# Patient Record
Sex: Female | Born: 1980 | Race: Black or African American | Hispanic: No | Marital: Single | Smoking: Never smoker
Health system: Southern US, Community
[De-identification: ages and names within clinical notes are randomized; demographics above are authoritative.]

## PROBLEM LIST (undated history)

## (undated) ENCOUNTER — Inpatient Hospital Stay (HOSPITAL_COMMUNITY): Payer: Medicaid Other

## (undated) DIAGNOSIS — E282 Polycystic ovarian syndrome: Secondary | ICD-10-CM

## (undated) DIAGNOSIS — R87629 Unspecified abnormal cytological findings in specimens from vagina: Secondary | ICD-10-CM

## (undated) DIAGNOSIS — I10 Essential (primary) hypertension: Secondary | ICD-10-CM

## (undated) DIAGNOSIS — R7303 Prediabetes: Secondary | ICD-10-CM

## (undated) DIAGNOSIS — B009 Herpesviral infection, unspecified: Secondary | ICD-10-CM

## (undated) DIAGNOSIS — N189 Chronic kidney disease, unspecified: Secondary | ICD-10-CM

## (undated) HISTORY — DX: Unspecified abnormal cytological findings in specimens from vagina: R87.629

## (undated) HISTORY — PX: MOUTH SURGERY: SHX715

## (undated) HISTORY — DX: Prediabetes: R73.03

## (undated) HISTORY — DX: Chronic kidney disease, unspecified: N18.9

## (undated) HISTORY — PX: KNEE SURGERY: SHX244

---
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
## 2004-06-16 IMAGING — CR DG CHEST 2V
2 series · 2 of 2 positions shown · non-contrast
Comparison: none
 Lung volumes are low.

CLINICAL DATA: Shortness of breath.  
 [GA] VIEWS:

[w chest pa *]
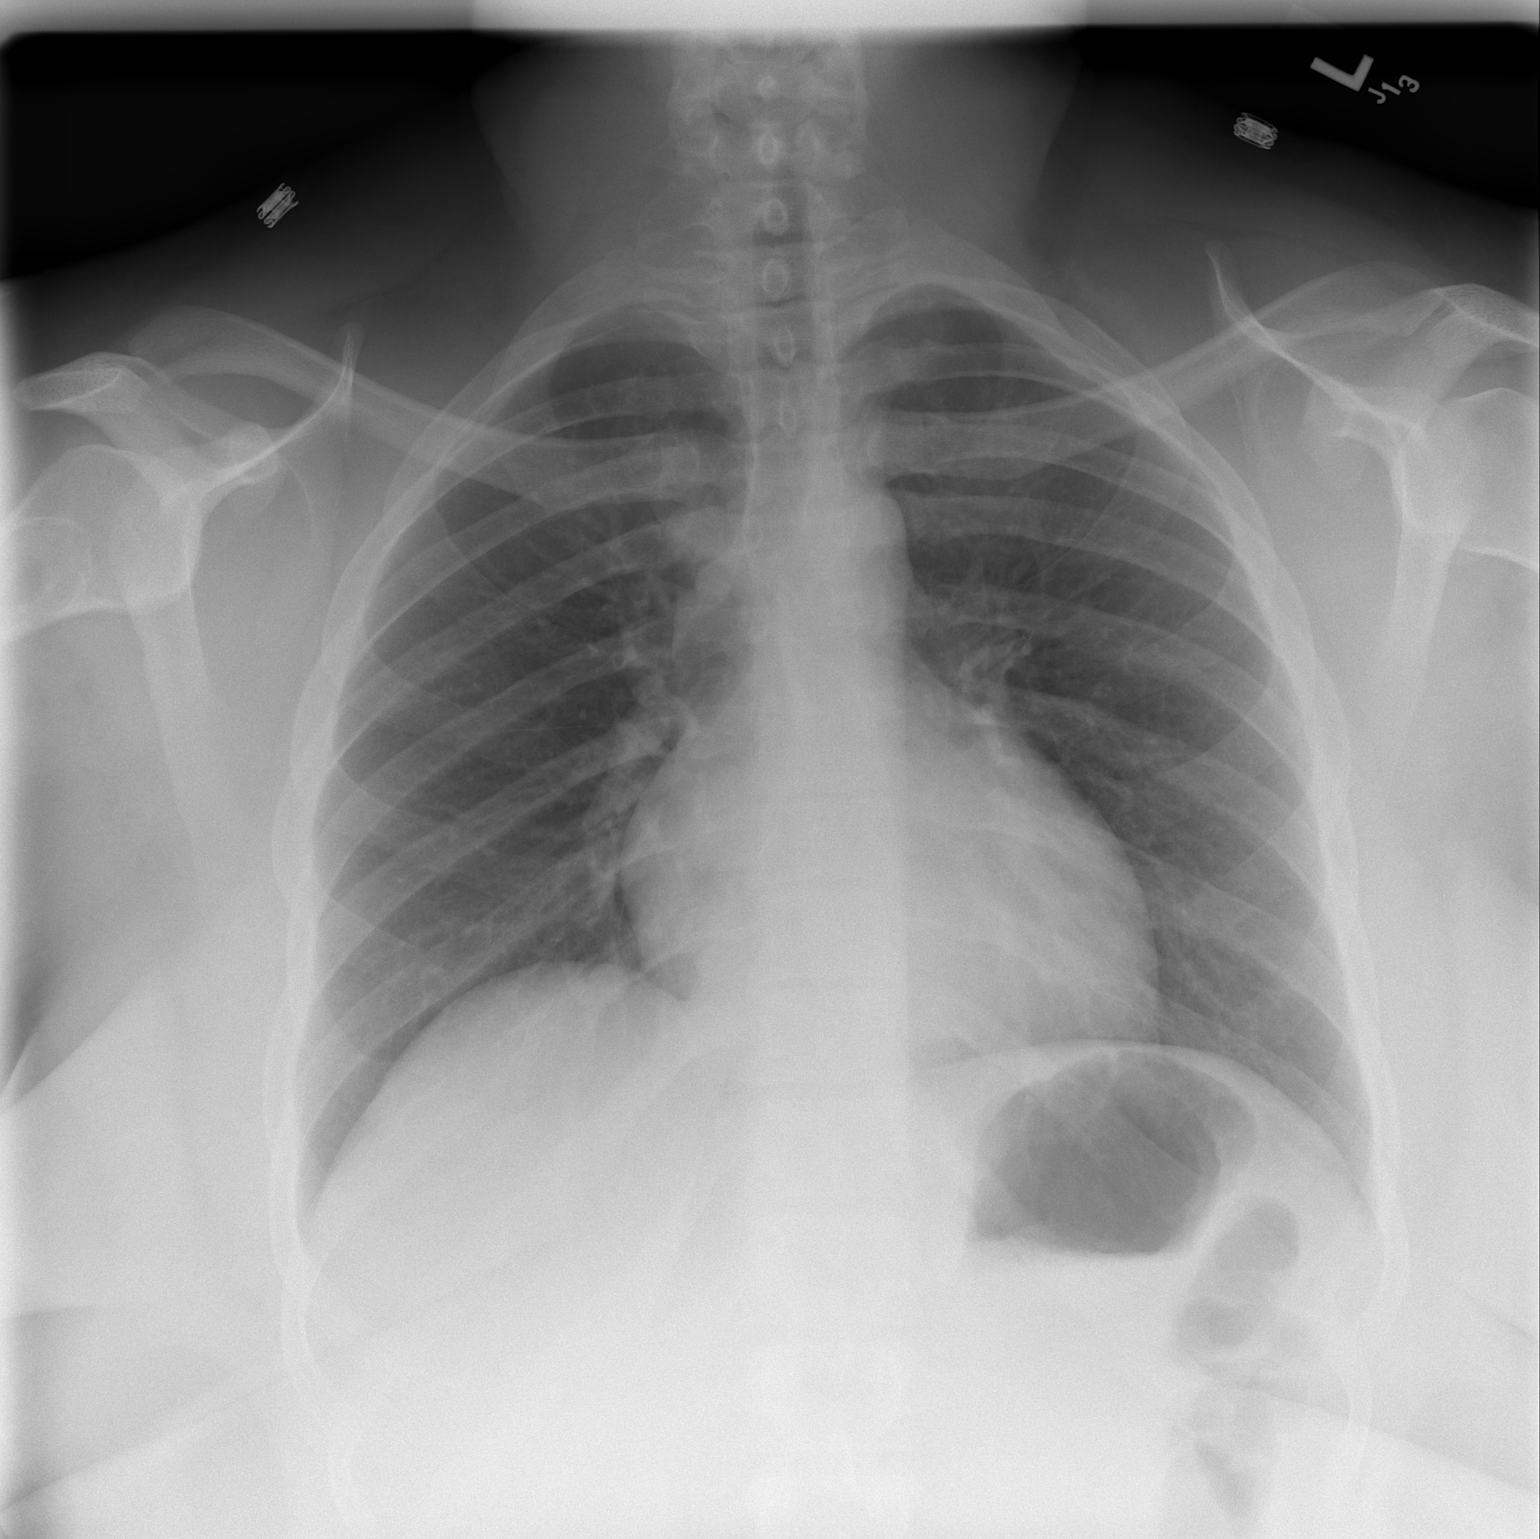

[w chest lat *]
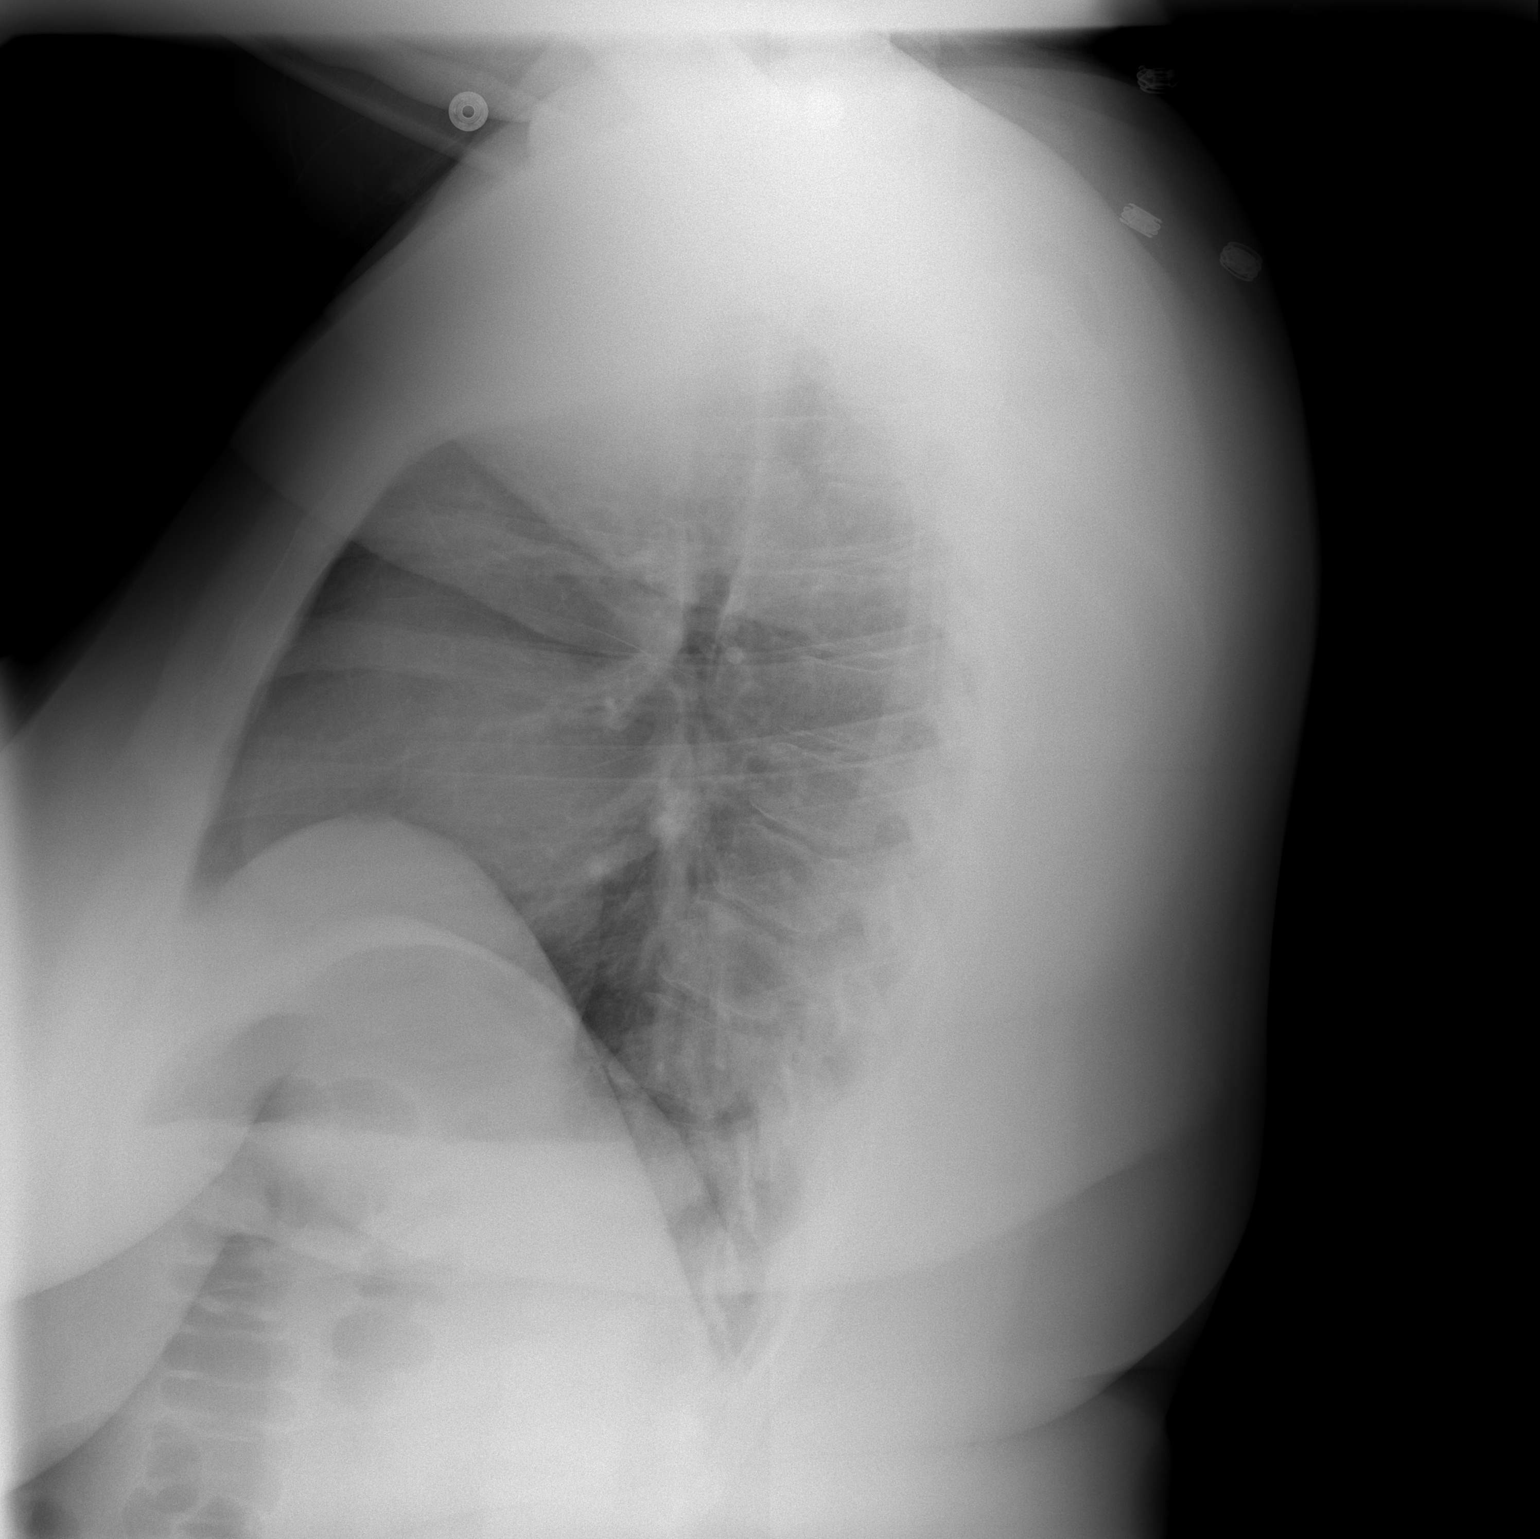

[2 of 2 positions shown; findings below may reference images not displayed]

There is no focal consolidation, edema or effusion.  The cardiopericardial silhouette is within normal limits for size.  Bony structures of the visualized thorax are intact.
IMPRESSION: No acute cardiopulmonary process.

## 2005-03-25 ENCOUNTER — Emergency Department (HOSPITAL_COMMUNITY): Admission: EM | Admit: 2005-03-25 | Discharge: 2005-03-26 | Payer: Self-pay | Admitting: Emergency Medicine

## 2005-06-16 ENCOUNTER — Emergency Department (HOSPITAL_COMMUNITY): Admission: EM | Admit: 2005-06-16 | Discharge: 2005-06-16 | Payer: Self-pay | Admitting: Emergency Medicine

## 2006-01-28 ENCOUNTER — Emergency Department (HOSPITAL_COMMUNITY): Admission: EM | Admit: 2006-01-28 | Discharge: 2006-01-29 | Payer: Self-pay | Admitting: Emergency Medicine

## 2006-05-15 ENCOUNTER — Encounter (INDEPENDENT_AMBULATORY_CARE_PROVIDER_SITE_OTHER): Payer: Self-pay | Admitting: Nurse Practitioner

## 2006-05-15 LAB — CONVERTED CEMR LAB
HCV Ab: NEGATIVE
Hep B S Ab: NEGATIVE
Hepatitis B Surface Ag: NEGATIVE
RPR Ser Ql: NONREACTIVE

## 2006-05-27 DIAGNOSIS — I10 Essential (primary) hypertension: Secondary | ICD-10-CM

## 2006-05-27 HISTORY — DX: Essential (primary) hypertension: I10

## 2006-06-09 ENCOUNTER — Encounter (INDEPENDENT_AMBULATORY_CARE_PROVIDER_SITE_OTHER): Payer: Self-pay | Admitting: Nurse Practitioner

## 2006-06-09 LAB — CONVERTED CEMR LAB: Hgb A1c MFr Bld: 6.8 %

## 2006-08-22 ENCOUNTER — Encounter (INDEPENDENT_AMBULATORY_CARE_PROVIDER_SITE_OTHER): Payer: Self-pay | Admitting: Nurse Practitioner

## 2006-08-22 LAB — CONVERTED CEMR LAB
Alkaline Phosphatase: 64 units/L
BUN: 28 mg/dL
CO2: 20 meq/L
Chloride: 103 meq/L
Cholesterol: 152 mg/dL
Creatinine, Ser: 1.4 mg/dL
HDL: 54 mg/dL
Hgb A1c MFr Bld: 5.5 %
LDL Cholesterol: 75 mg/dL
Total Protein: 8.4 g/dL
Triglycerides: 115 mg/dL

## 2007-08-19 ENCOUNTER — Ambulatory Visit: Payer: Self-pay | Admitting: Nurse Practitioner

## 2007-08-19 DIAGNOSIS — G44009 Cluster headache syndrome, unspecified, not intractable: Secondary | ICD-10-CM | POA: Insufficient documentation

## 2007-08-19 DIAGNOSIS — K029 Dental caries, unspecified: Secondary | ICD-10-CM | POA: Insufficient documentation

## 2007-09-08 ENCOUNTER — Encounter (INDEPENDENT_AMBULATORY_CARE_PROVIDER_SITE_OTHER): Payer: Self-pay | Admitting: Nurse Practitioner

## 2007-09-22 ENCOUNTER — Encounter (INDEPENDENT_AMBULATORY_CARE_PROVIDER_SITE_OTHER): Payer: Self-pay | Admitting: Nurse Practitioner

## 2007-10-02 ENCOUNTER — Ambulatory Visit: Payer: Self-pay | Admitting: Nurse Practitioner

## 2007-10-02 DIAGNOSIS — E669 Obesity, unspecified: Secondary | ICD-10-CM

## 2007-10-02 DIAGNOSIS — L259 Unspecified contact dermatitis, unspecified cause: Secondary | ICD-10-CM

## 2007-10-02 LAB — CONVERTED CEMR LAB
Alkaline Phosphatase: 43 units/L (ref 39–117)
BUN: 19 mg/dL (ref 6–23)
Basophils Absolute: 0 10*3/uL (ref 0.0–0.1)
Basophils Relative: 1 % (ref 0–1)
Bilirubin Urine: NEGATIVE
Blood in Urine, dipstick: NEGATIVE
CO2: 23 meq/L (ref 19–32)
Chlamydia, DNA Probe: NEGATIVE
Chloride: 108 meq/L (ref 96–112)
Creatinine, Ser: 1.04 mg/dL (ref 0.40–1.20)
Eosinophils Absolute: 0.1 10*3/uL (ref 0.0–0.7)
Eosinophils Relative: 2 % (ref 0–5)
FSH: 6.2 milliintl units/mL
Glucose, Urine, Semiquant: NEGATIVE
HDL: 46 mg/dL (ref >39)
LDL Cholesterol: 103 mg/dL — ABNORMAL HIGH (ref 0–99)
MCHC: 34.8 g/dL (ref 30.0–36.0)
Monocytes Relative: 6 % (ref 3–12)
Neutro Abs: 3.3 10*3/uL (ref 1.7–7.7)
Nitrite: NEGATIVE
Platelets: 246 10*3/uL (ref 150–400)
Potassium: 4.5 meq/L (ref 3.5–5.3)
Prolactin: 5 ng/mL
RBC: 5.08 M/uL (ref 3.87–5.11)
Sex Hormone Binding: 26 nmol/L (ref 18–114)
Sodium: 142 meq/L (ref 135–145)
Specific Gravity, Urine: >=1.03
TSH: 1.777 microintl units/mL (ref 0.350–5.50)
Testosterone Free: 10 pg/mL — ABNORMAL HIGH (ref 0.6–6.8)
Testosterone-% Free: 2.1 % (ref 0.4–2.4)
Testosterone: 48.55 ng/dL (ref 10–70)
Total CHOL/HDL Ratio: 3.5
Triglycerides: 48 mg/dL (ref <150)
VLDL: 10 mg/dL (ref 0–40)
WBC Urine, dipstick: NEGATIVE

## 2007-10-08 LAB — CONVERTED CEMR LAB: Pap Smear: NEGATIVE

## 2007-10-09 ENCOUNTER — Ambulatory Visit: Payer: Self-pay | Admitting: *Deleted

## 2007-10-09 ENCOUNTER — Ambulatory Visit (HOSPITAL_COMMUNITY): Admission: RE | Admit: 2007-10-09 | Discharge: 2007-10-09 | Payer: Self-pay | Admitting: Internal Medicine

## 2007-10-09 IMAGING — US US TRANSVAGINAL NON-OB
1 series · 14 of 25 positions shown · non-contrast
Comparison: None.

CLINICAL DATA: Lower pelvic discomfort.  Abnormal menses.  History
of polycystic ovary syndrome.

TRANSVAGINAL ULTRASOUND OF PELVIS,ULTRASOUND PELVIS COMPLETE -
MODIFY
TECHNIQUE: Transvaginal ultrasound examination of the pelvis was
performed including evaluation of the uterus, ovaries, adnexal
regions, and pelvic cul-de-sac,

[Series 1: unknown · 0.30mm/px · 14 of 65 slices shown]
[im 1/65]
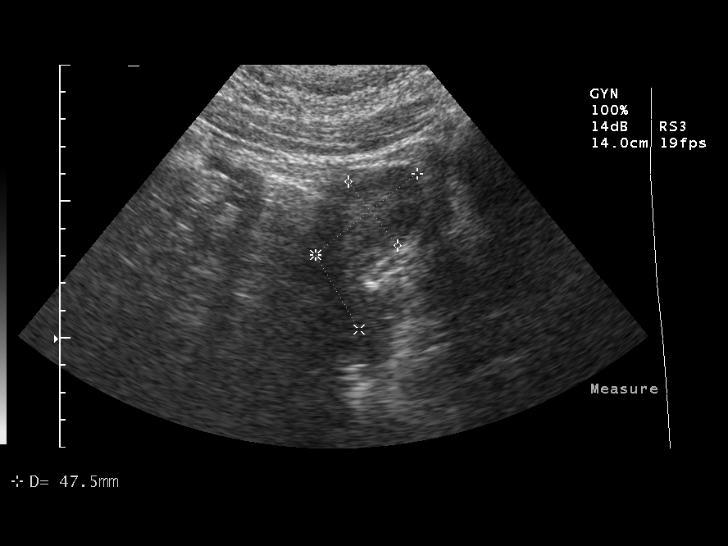
[im 6/65]
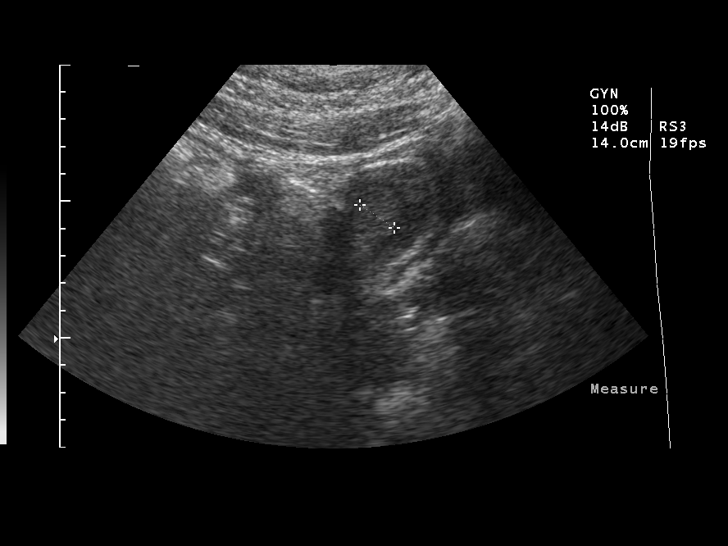
[im 11/65]
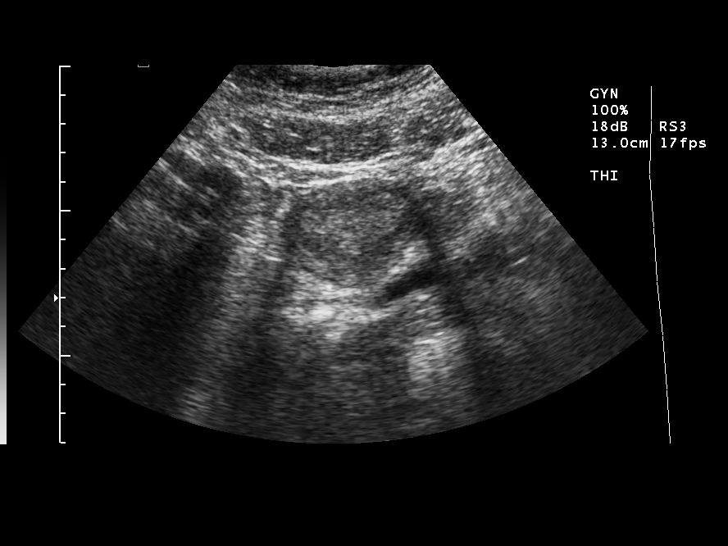
[im 17/65]
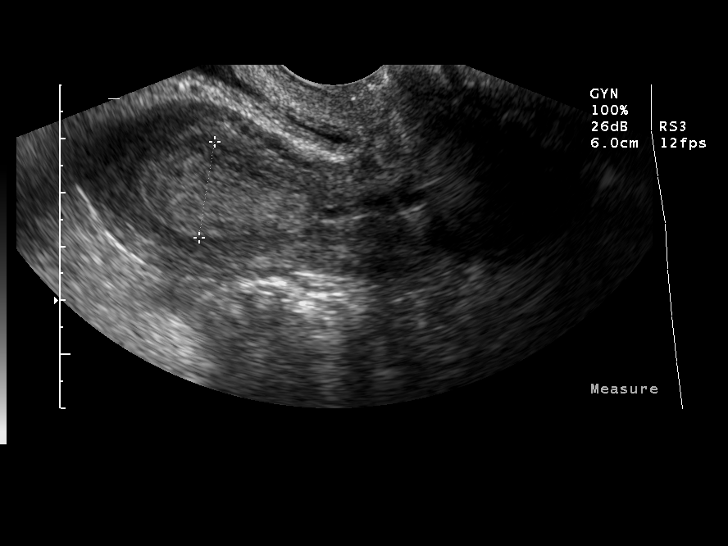
[im 22/65]
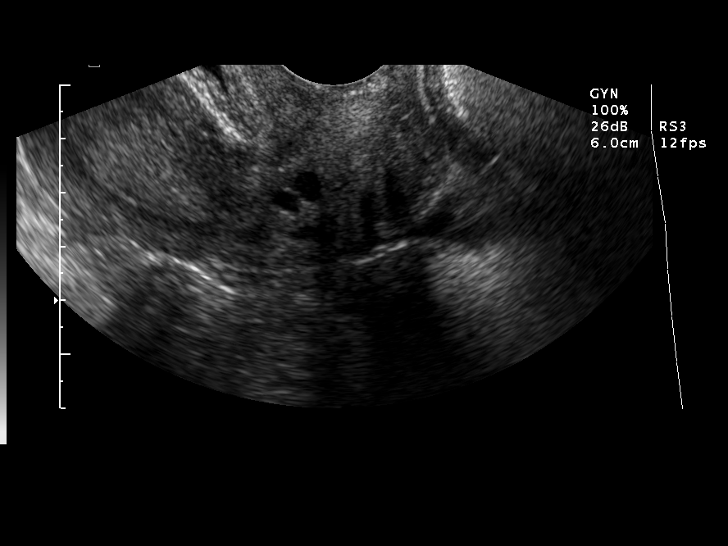
[im 25/65]
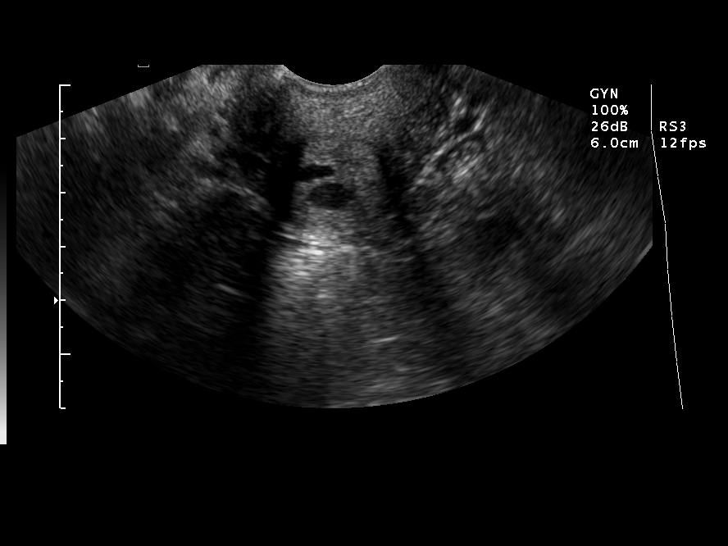
[im 30/65]
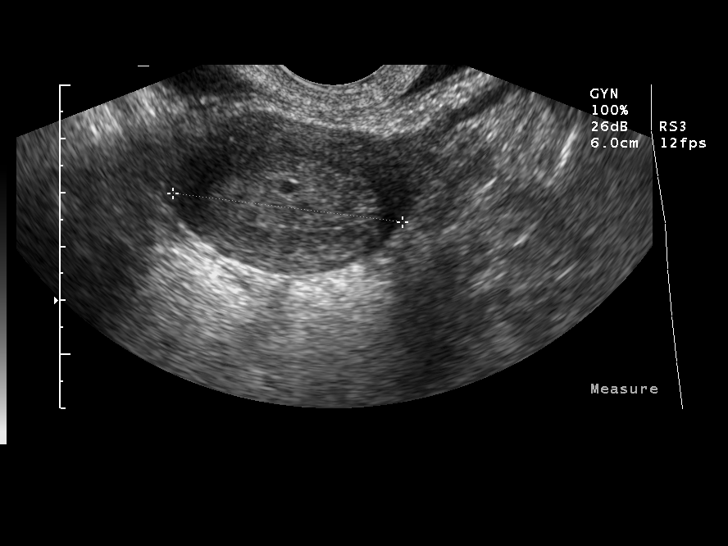
[im 35/65]
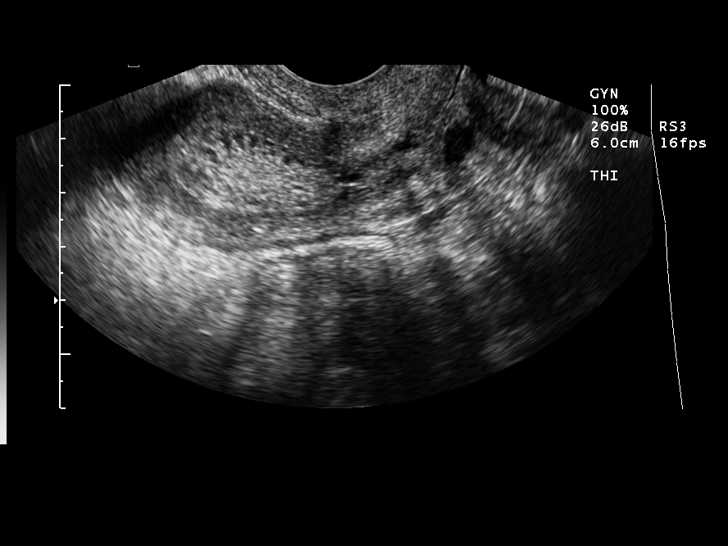
[im 41/65]
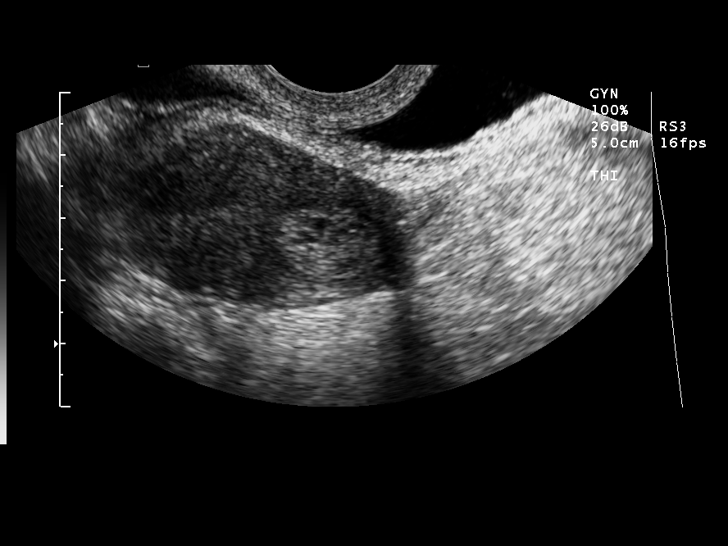
[im 43/65]
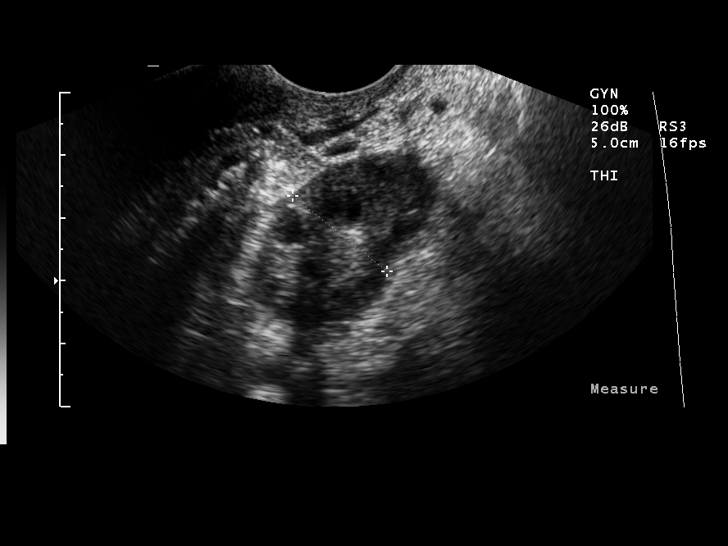
[im 49/65]
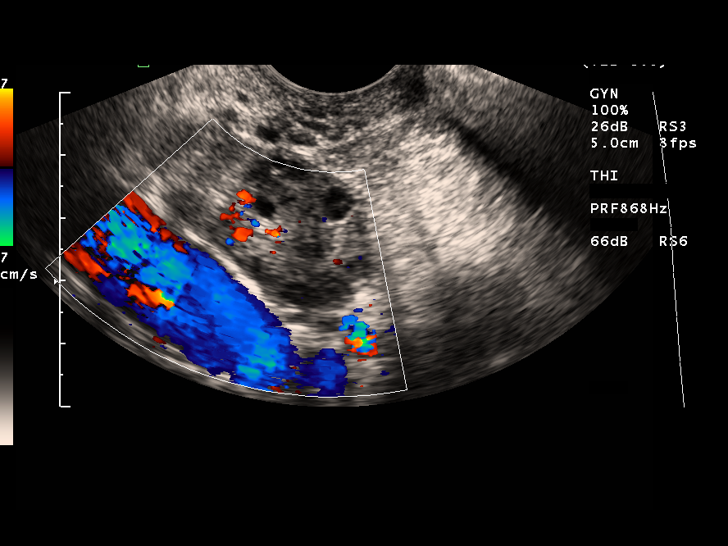
[im 54/65]
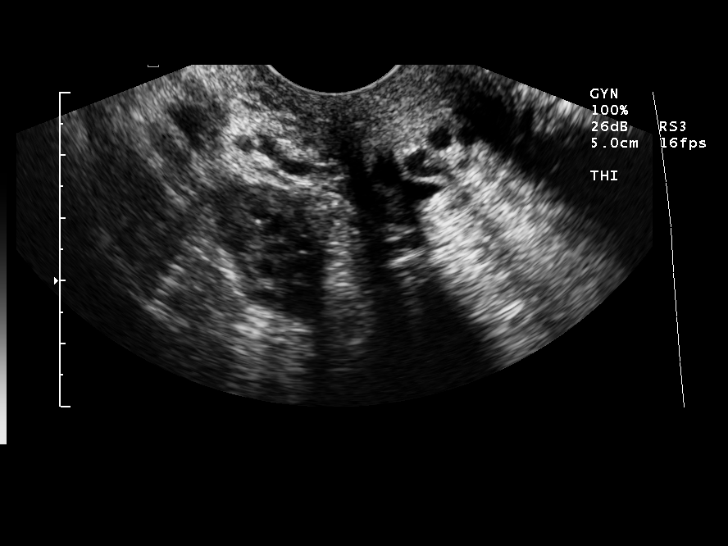
[im 59/65]
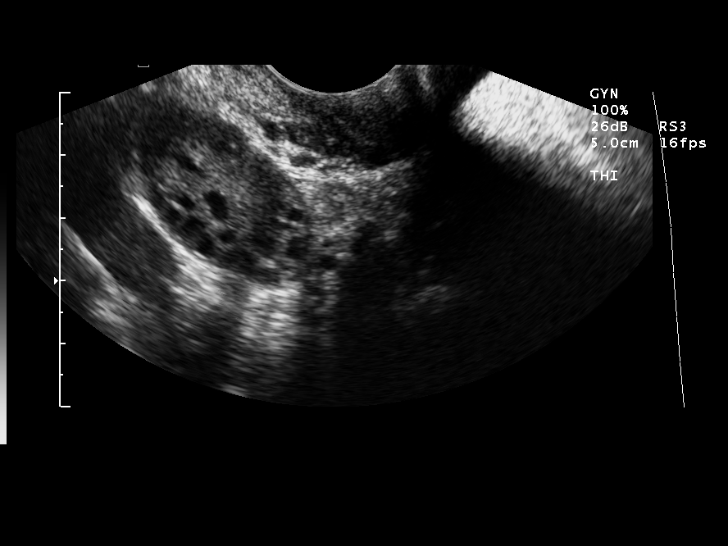
[im 65/65]
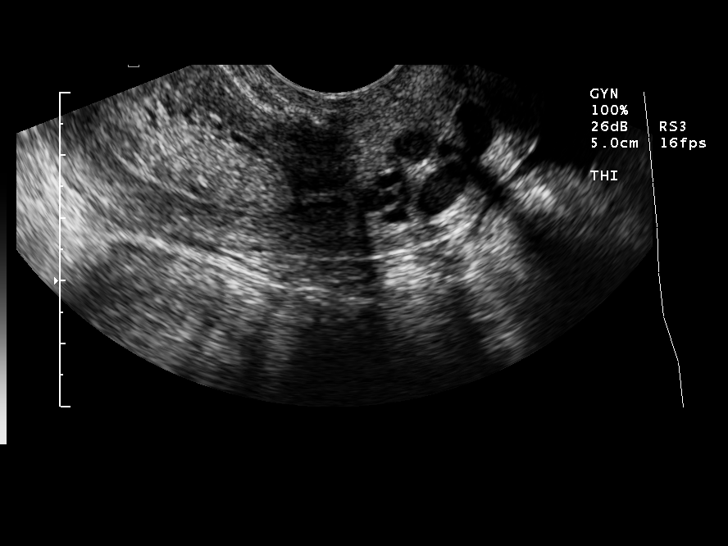

[14 of 25 positions shown; findings below may reference images not displayed]

FINDINGS: Uterus measures 8 x 2.9 x 4.3 cm.  Incidentally noted are
Nabothian cyst T.  Abnormally thickened endometrial lining
measuring up to 1.8 cm there may be endometrial polyps.  This will
require further delineation to exclude malignancy.

Right ovary measures 3.5 x 2 x 1.8 cm and the left ovary 3.5 x
x 1.9 cm.  The ovaries appear slightly rounded in configuration
with several follicles along the periphery which may be related to
the patient's polycystic ovary syndrome.  Small amount of free
fluid
IMPRESSION: Abnormal appearance of the endometrial lining.  Polyp or tumor
cannot be excluded.  This will require follow-up.

Increased number of ovarian follicles with a slightly rounded
contour of the ovaries.  This may be normal versus changes related
to polycystic ovary syndrome.

## 2007-10-21 ENCOUNTER — Encounter (INDEPENDENT_AMBULATORY_CARE_PROVIDER_SITE_OTHER): Payer: Self-pay | Admitting: *Deleted

## 2007-11-04 ENCOUNTER — Ambulatory Visit: Payer: Self-pay | Admitting: Nurse Practitioner

## 2007-12-29 ENCOUNTER — Telehealth (INDEPENDENT_AMBULATORY_CARE_PROVIDER_SITE_OTHER): Payer: Self-pay | Admitting: Nurse Practitioner

## 2008-01-14 ENCOUNTER — Ambulatory Visit (HOSPITAL_COMMUNITY): Admission: RE | Admit: 2008-01-14 | Discharge: 2008-01-14 | Payer: Self-pay | Admitting: Family Medicine

## 2008-01-14 IMAGING — US US TRANSVAGINAL NON-OB
1 series · 14 of 25 positions shown · non-contrast
Comparison: [DATE]

CLINICAL DATA: Endometrial thickening and polycystic ovarian
syndrome

TRANSABDOMINAL AND TRANSVAGINAL ULTRASOUND OF PELVIS
TECHNIQUE: Both transabdominal and transvaginal ultrasound
examinations of the pelvis were performed including evaluation of
the uterus, ovaries, adnexal regions, and pelvic cul-de-sac.

[Series 1: unknown · 0.26mm/px · 14 of 50 slices shown]
[im 1/50]
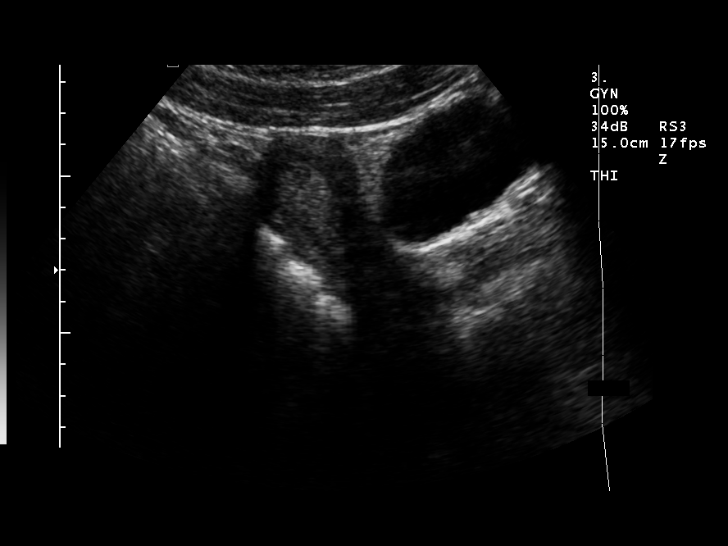
[im 5/50]
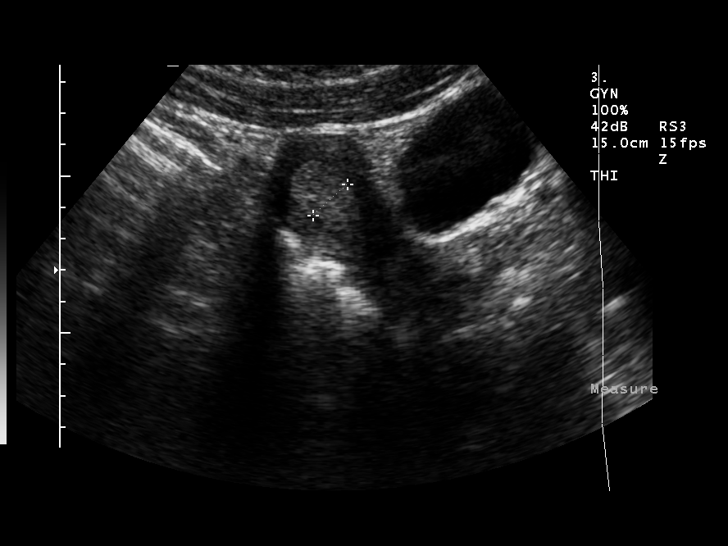
[im 9/50]
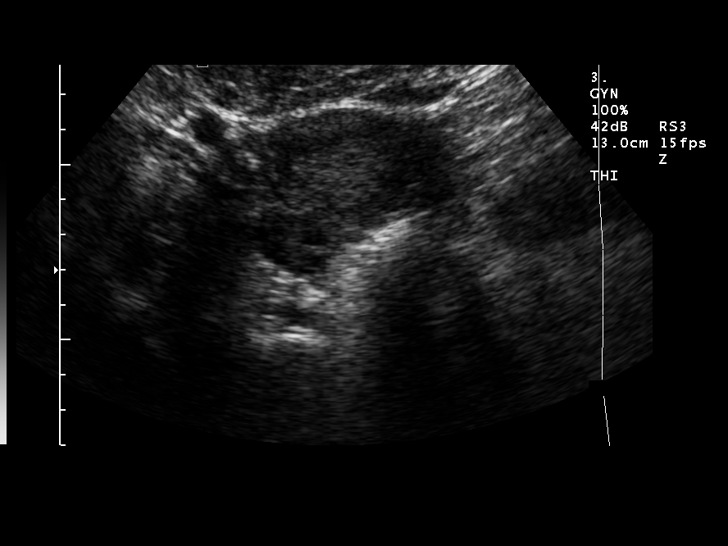
[im 13/50]
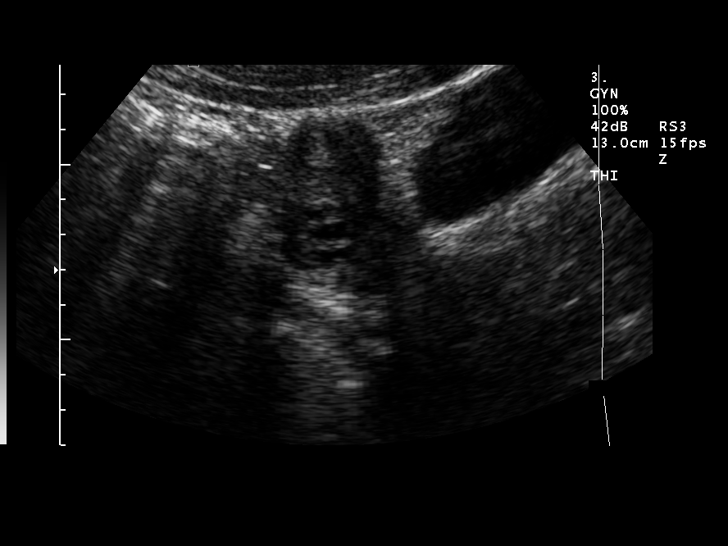
[im 17/50]
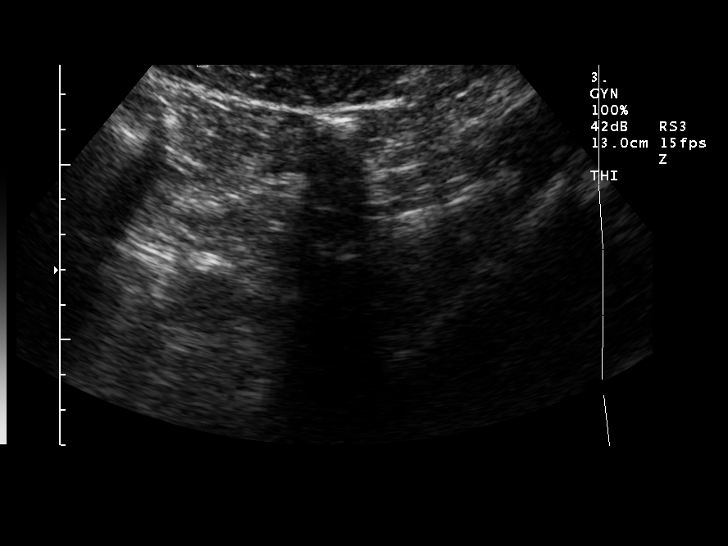
[im 19/50]
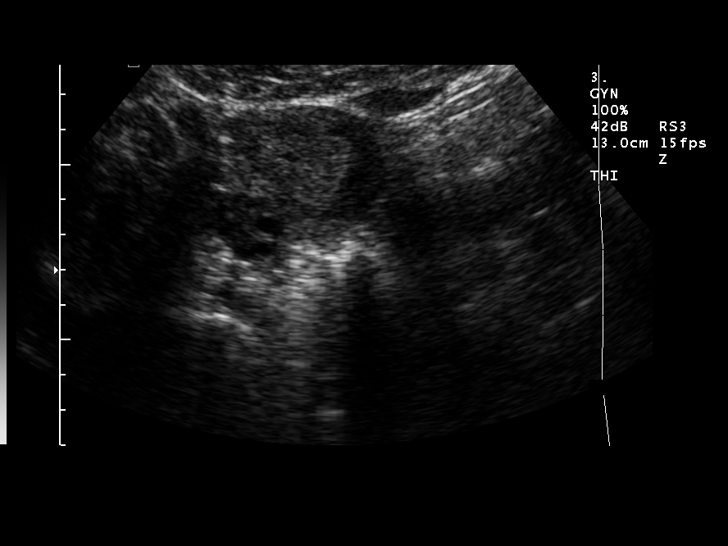
[im 23/50]
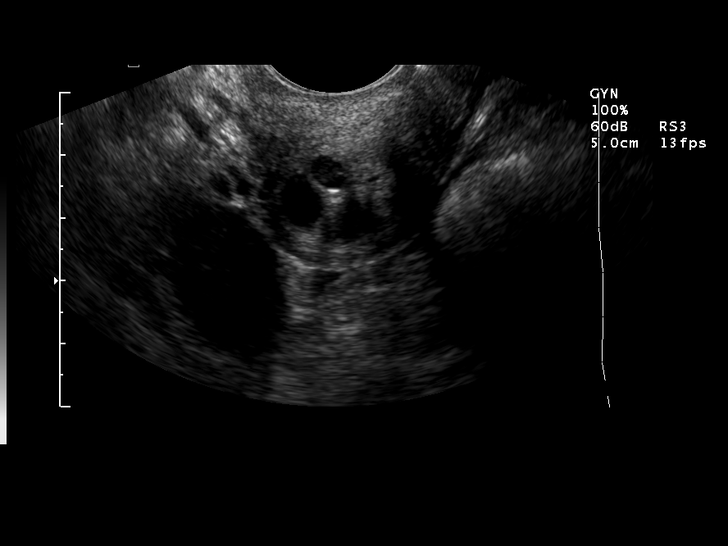
[im 27/50]
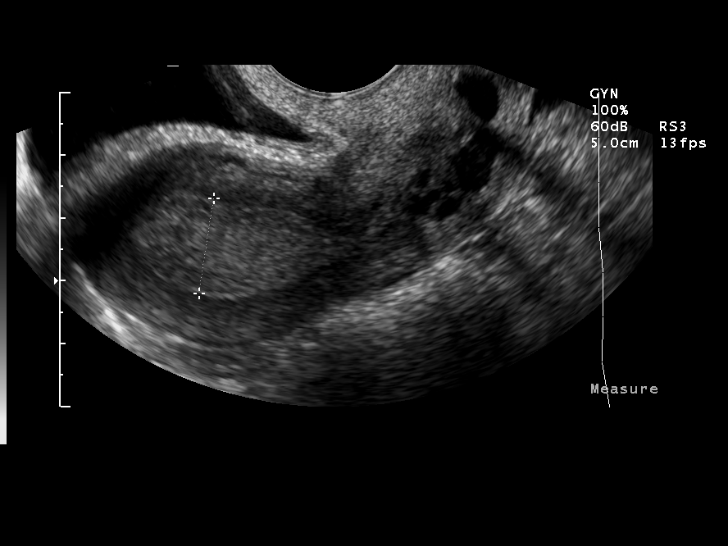
[im 31/50]
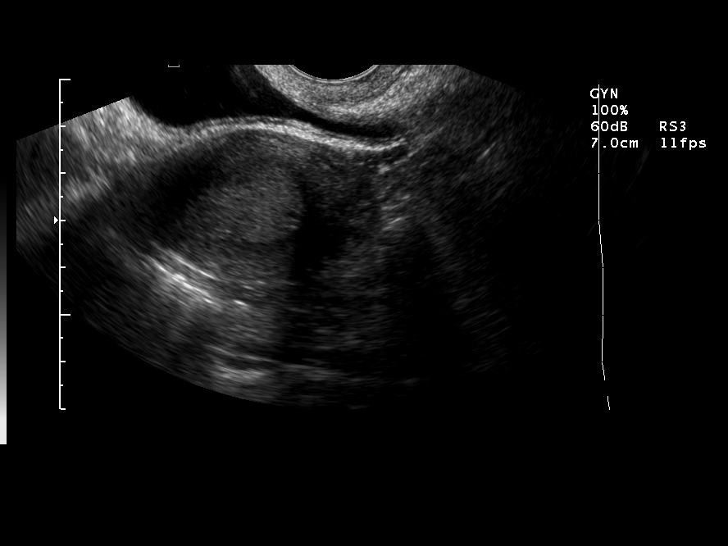
[im 33/50]
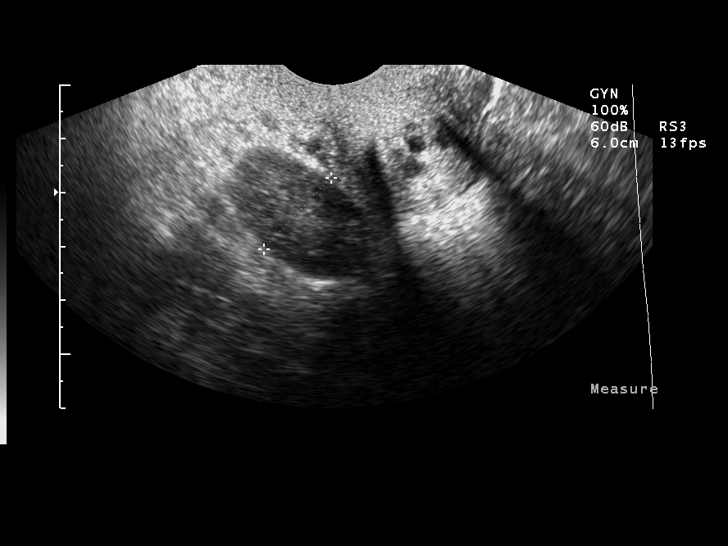
[im 37/50]
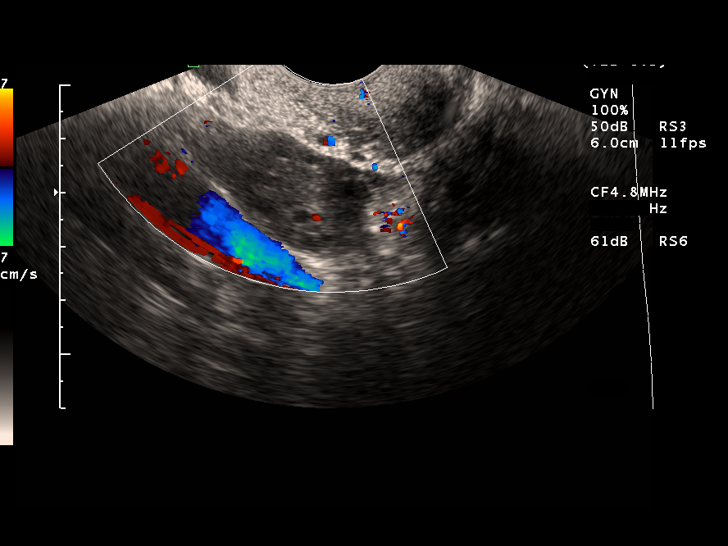
[im 41/50]
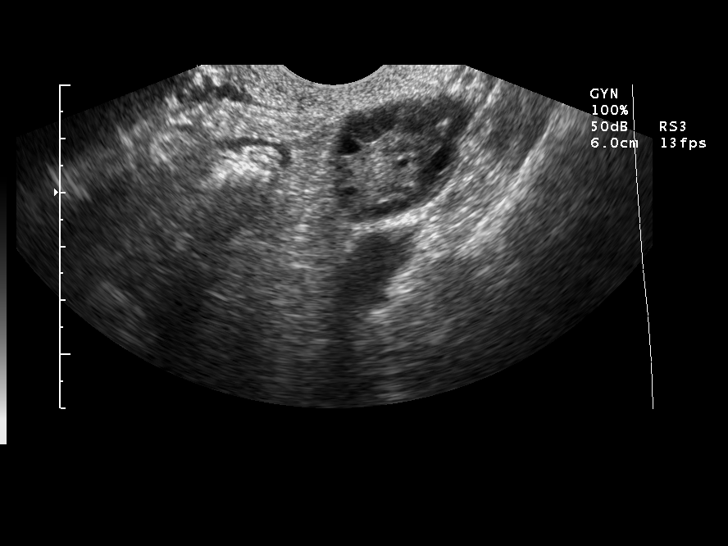
[im 45/50]
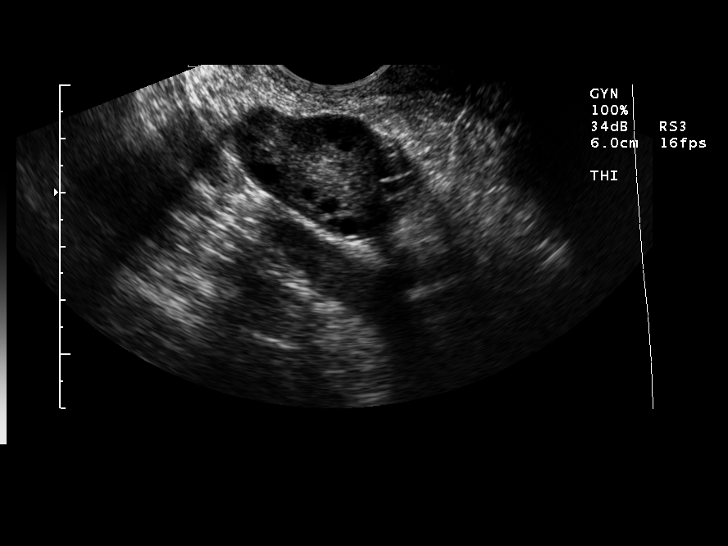
[im 50/50]
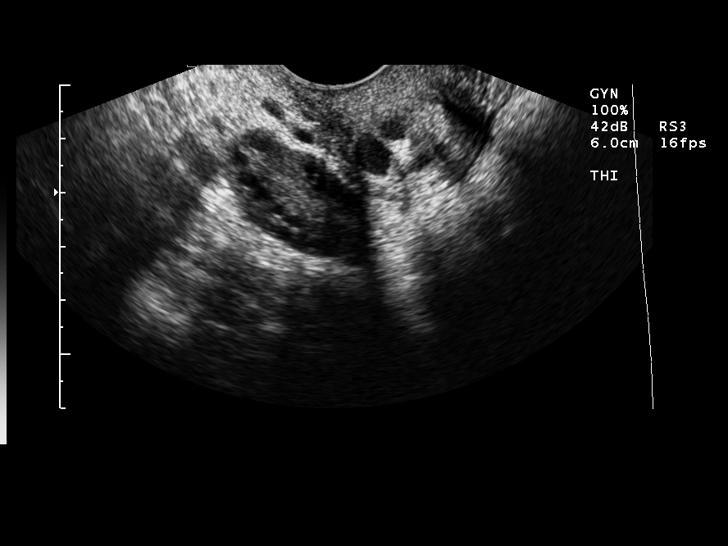

[14 of 25 positions shown; findings below may reference images not displayed]

FINDINGS: The uterus is normal in size measuring 7.9 x 3.3 x
cm.  The endometrium remains thick and heterogeneous, measuring 16
mm in width.

Both ovaries have a normal size and appearance.  The right ovary
measures 1.8 x 3.6 x 2.0 cm, and the left ovary measures 2.1 x
x 2.0 cm.  No adnexal masses or free pelvic fluid are identified.
IMPRESSION: Persistent thickening and heterogeneity of the endometrium since
[DATE].  Tissue sampling is recommended.

## 2008-01-18 ENCOUNTER — Encounter (INDEPENDENT_AMBULATORY_CARE_PROVIDER_SITE_OTHER): Payer: Self-pay | Admitting: Nurse Practitioner

## 2008-01-18 ENCOUNTER — Encounter (INDEPENDENT_AMBULATORY_CARE_PROVIDER_SITE_OTHER): Payer: Self-pay | Admitting: *Deleted

## 2008-01-18 DIAGNOSIS — N8500 Endometrial hyperplasia, unspecified: Secondary | ICD-10-CM

## 2008-01-22 ENCOUNTER — Ambulatory Visit: Payer: Self-pay | Admitting: Nurse Practitioner

## 2008-01-27 ENCOUNTER — Encounter (INDEPENDENT_AMBULATORY_CARE_PROVIDER_SITE_OTHER): Payer: Self-pay | Admitting: Nurse Practitioner

## 2008-03-09 ENCOUNTER — Other Ambulatory Visit: Admission: RE | Admit: 2008-03-09 | Discharge: 2008-03-09 | Payer: Self-pay | Admitting: Obstetrics and Gynecology

## 2008-03-09 ENCOUNTER — Ambulatory Visit: Payer: Self-pay | Admitting: Obstetrics and Gynecology

## 2008-03-09 ENCOUNTER — Encounter: Payer: Self-pay | Admitting: Obstetrics and Gynecology

## 2008-03-23 ENCOUNTER — Ambulatory Visit: Payer: Self-pay | Admitting: Obstetrics and Gynecology

## 2008-06-21 ENCOUNTER — Ambulatory Visit: Payer: Self-pay | Admitting: Nurse Practitioner

## 2008-06-21 DIAGNOSIS — R252 Cramp and spasm: Secondary | ICD-10-CM

## 2008-06-21 DIAGNOSIS — I10 Essential (primary) hypertension: Secondary | ICD-10-CM

## 2008-06-21 LAB — CONVERTED CEMR LAB
Calcium: 9.5 mg/dL (ref 8.4–10.5)
Glucose, Bld: 92 mg/dL (ref 70–99)

## 2008-06-22 ENCOUNTER — Encounter (INDEPENDENT_AMBULATORY_CARE_PROVIDER_SITE_OTHER): Payer: Self-pay | Admitting: Nurse Practitioner

## 2008-07-04 ENCOUNTER — Emergency Department (HOSPITAL_COMMUNITY): Admission: EM | Admit: 2008-07-04 | Discharge: 2008-07-04 | Payer: Self-pay | Admitting: Family Medicine

## 2008-07-04 IMAGING — CR DG CHEST 2V
2 series · 2 of 2 positions shown · non-contrast
Comparison: [DATE]

CLINICAL DATA: Chest pain and shortness of breath.

CHEST - 2 VIEW

[view not recorded (1 of 2)]
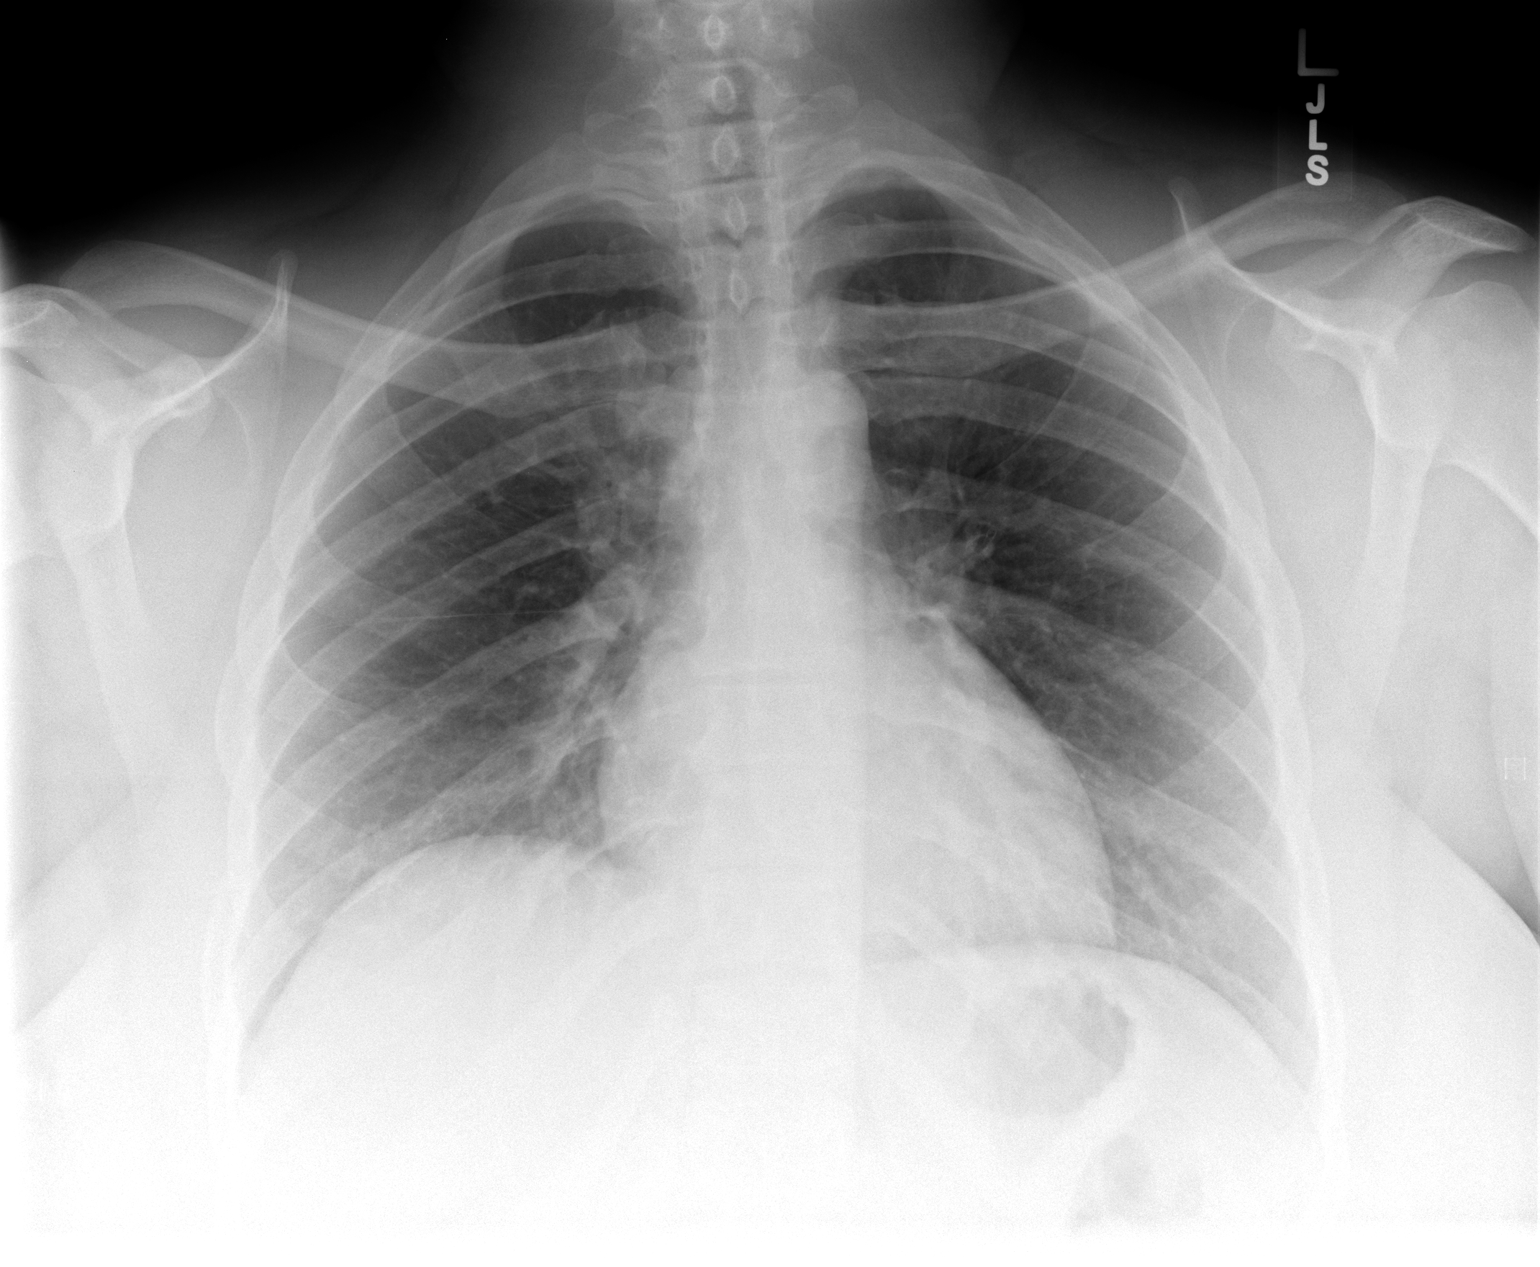

[view not recorded (2 of 2)]
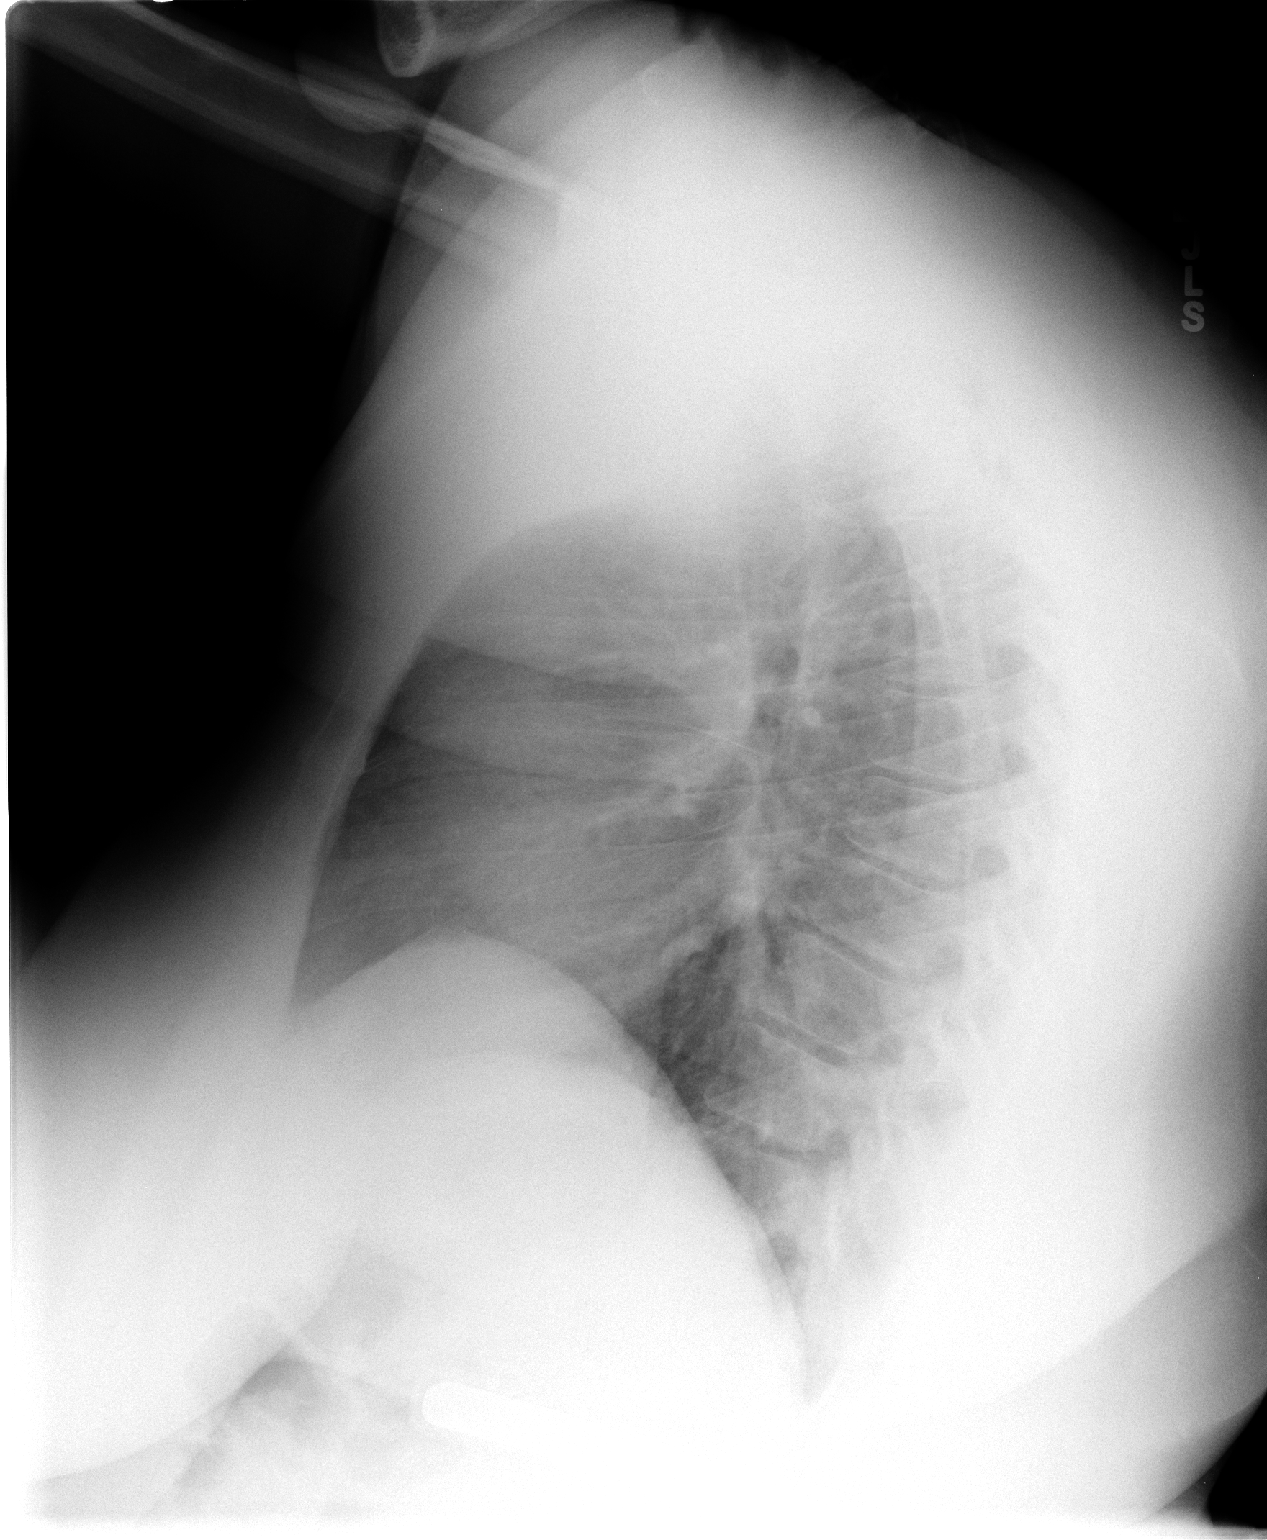

[2 of 2 positions shown; findings below may reference images not displayed]

FINDINGS: Trachea is midline.  Heart size stable.  Minimal right
basilar atelectasis.  Lungs otherwise clear.  No pleural fluid.
IMPRESSION: No acute findings.

REF:W2 DICTATED: [DATE] [DATE]

## 2008-10-21 ENCOUNTER — Encounter (INDEPENDENT_AMBULATORY_CARE_PROVIDER_SITE_OTHER): Payer: Self-pay | Admitting: Nurse Practitioner

## 2008-10-21 ENCOUNTER — Ambulatory Visit: Payer: Self-pay | Admitting: Nurse Practitioner

## 2008-10-21 LAB — CONVERTED CEMR LAB
Blood in Urine, dipstick: NEGATIVE
Glucose, Urine, Semiquant: NEGATIVE
KOH Prep: NEGATIVE
Ketones, urine, test strip: NEGATIVE
Nitrite: NEGATIVE
pH: 5.5

## 2008-10-27 ENCOUNTER — Encounter (INDEPENDENT_AMBULATORY_CARE_PROVIDER_SITE_OTHER): Payer: Self-pay | Admitting: Nurse Practitioner

## 2008-10-27 DIAGNOSIS — N760 Acute vaginitis: Secondary | ICD-10-CM | POA: Insufficient documentation

## 2008-10-27 DIAGNOSIS — D649 Anemia, unspecified: Secondary | ICD-10-CM

## 2008-10-27 LAB — CONVERTED CEMR LAB
AST: 18 units/L (ref 0–37)
Albumin: 3.8 g/dL (ref 3.5–5.2)
Alkaline Phosphatase: 38 units/L — ABNORMAL LOW (ref 39–117)
BUN: 10 mg/dL (ref 6–23)
Basophils Relative: 1 % (ref 0–1)
Calcium: 9.3 mg/dL (ref 8.4–10.5)
Chlamydia, DNA Probe: NEGATIVE
Cholesterol: 182 mg/dL (ref 0–200)
GC Probe Amp, Genital: NEGATIVE
Glucose, Bld: 79 mg/dL (ref 70–99)
HCT: 35 % — ABNORMAL LOW (ref 36.0–46.0)
LDL Cholesterol: 115 mg/dL — ABNORMAL HIGH (ref 0–99)
Lymphocytes Relative: 37 % (ref 12–46)
Lymphs Abs: 2 10*3/uL (ref 0.7–4.0)
MCV: 83.3 fL (ref 78.0–100.0)
Monocytes Relative: 8 % (ref 3–12)
Neutro Abs: 2.8 10*3/uL (ref 1.7–7.7)
Platelets: 279 10*3/uL (ref 150–400)
Potassium: 4.5 meq/L (ref 3.5–5.3)
RDW: 14.7 % (ref 11.5–15.5)
Sodium: 143 meq/L (ref 135–145)
TSH: 1.435 microintl units/mL (ref 0.350–4.500)
Total Bilirubin: 0.3 mg/dL (ref 0.3–1.2)
Triglycerides: 80 mg/dL (ref <150)

## 2009-01-24 ENCOUNTER — Ambulatory Visit: Payer: Self-pay | Admitting: Nurse Practitioner

## 2009-01-24 LAB — CONVERTED CEMR LAB: GC Probe Amp, Urine: NEGATIVE

## 2009-01-25 ENCOUNTER — Encounter (INDEPENDENT_AMBULATORY_CARE_PROVIDER_SITE_OTHER): Payer: Self-pay | Admitting: Nurse Practitioner

## 2009-06-25 ENCOUNTER — Emergency Department (HOSPITAL_COMMUNITY): Admission: EM | Admit: 2009-06-25 | Discharge: 2009-06-25 | Payer: Self-pay | Admitting: Emergency Medicine

## 2009-08-07 ENCOUNTER — Ambulatory Visit: Payer: Self-pay | Admitting: Nurse Practitioner

## 2009-08-07 LAB — CONVERTED CEMR LAB
Casts: NONE SEEN /lpf
Crystals: NONE SEEN
Leukocytes, UA: NEGATIVE
Specific Gravity, Urine: 1.023 (ref 1.005–1.030)

## 2009-08-15 ENCOUNTER — Emergency Department (HOSPITAL_COMMUNITY): Admission: EM | Admit: 2009-08-15 | Discharge: 2009-08-15 | Payer: Self-pay | Admitting: Family Medicine

## 2009-08-15 IMAGING — CR DG CHEST 2V
2 series · 2 of 2 positions shown · non-contrast
Comparison: [HOSPITAL] chest x-ray [DATE].

CLINICAL DATA: Blunt trauma with chest pain.

CHEST - 2 VIEW

[view not recorded (1 of 2)]
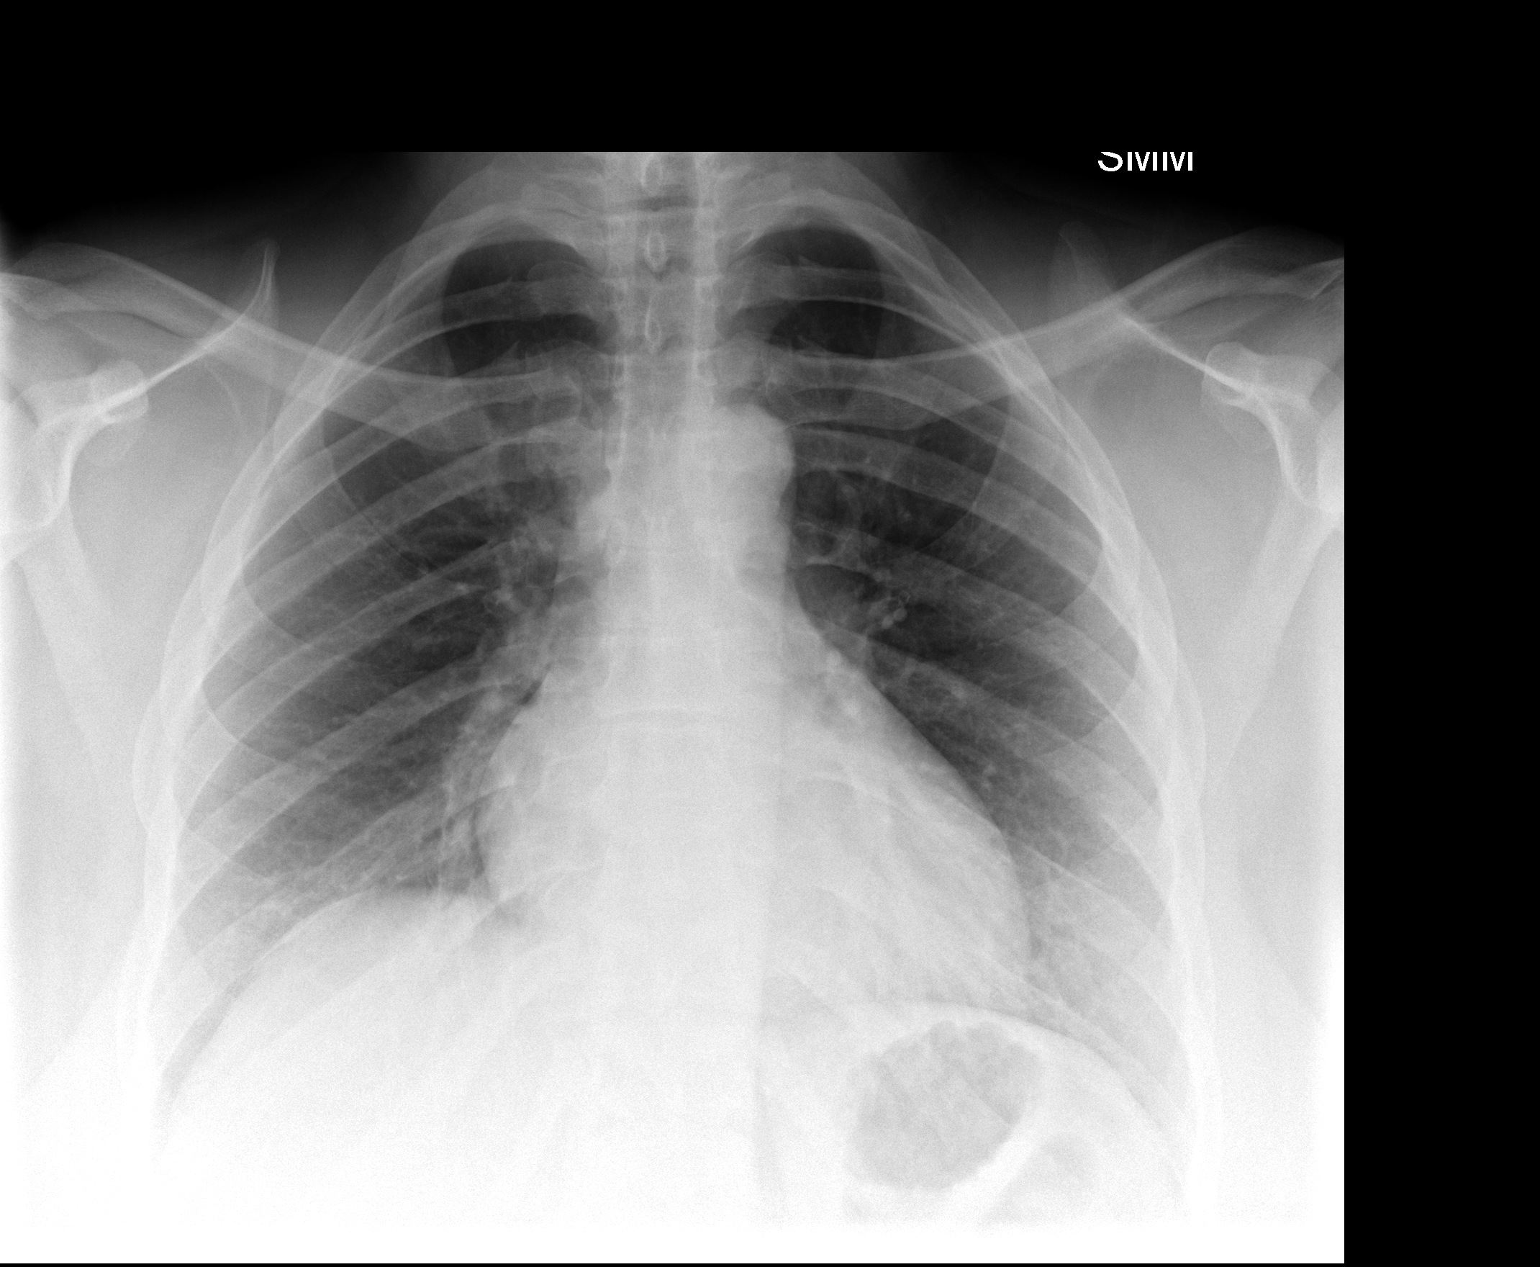

[view not recorded (2 of 2)]
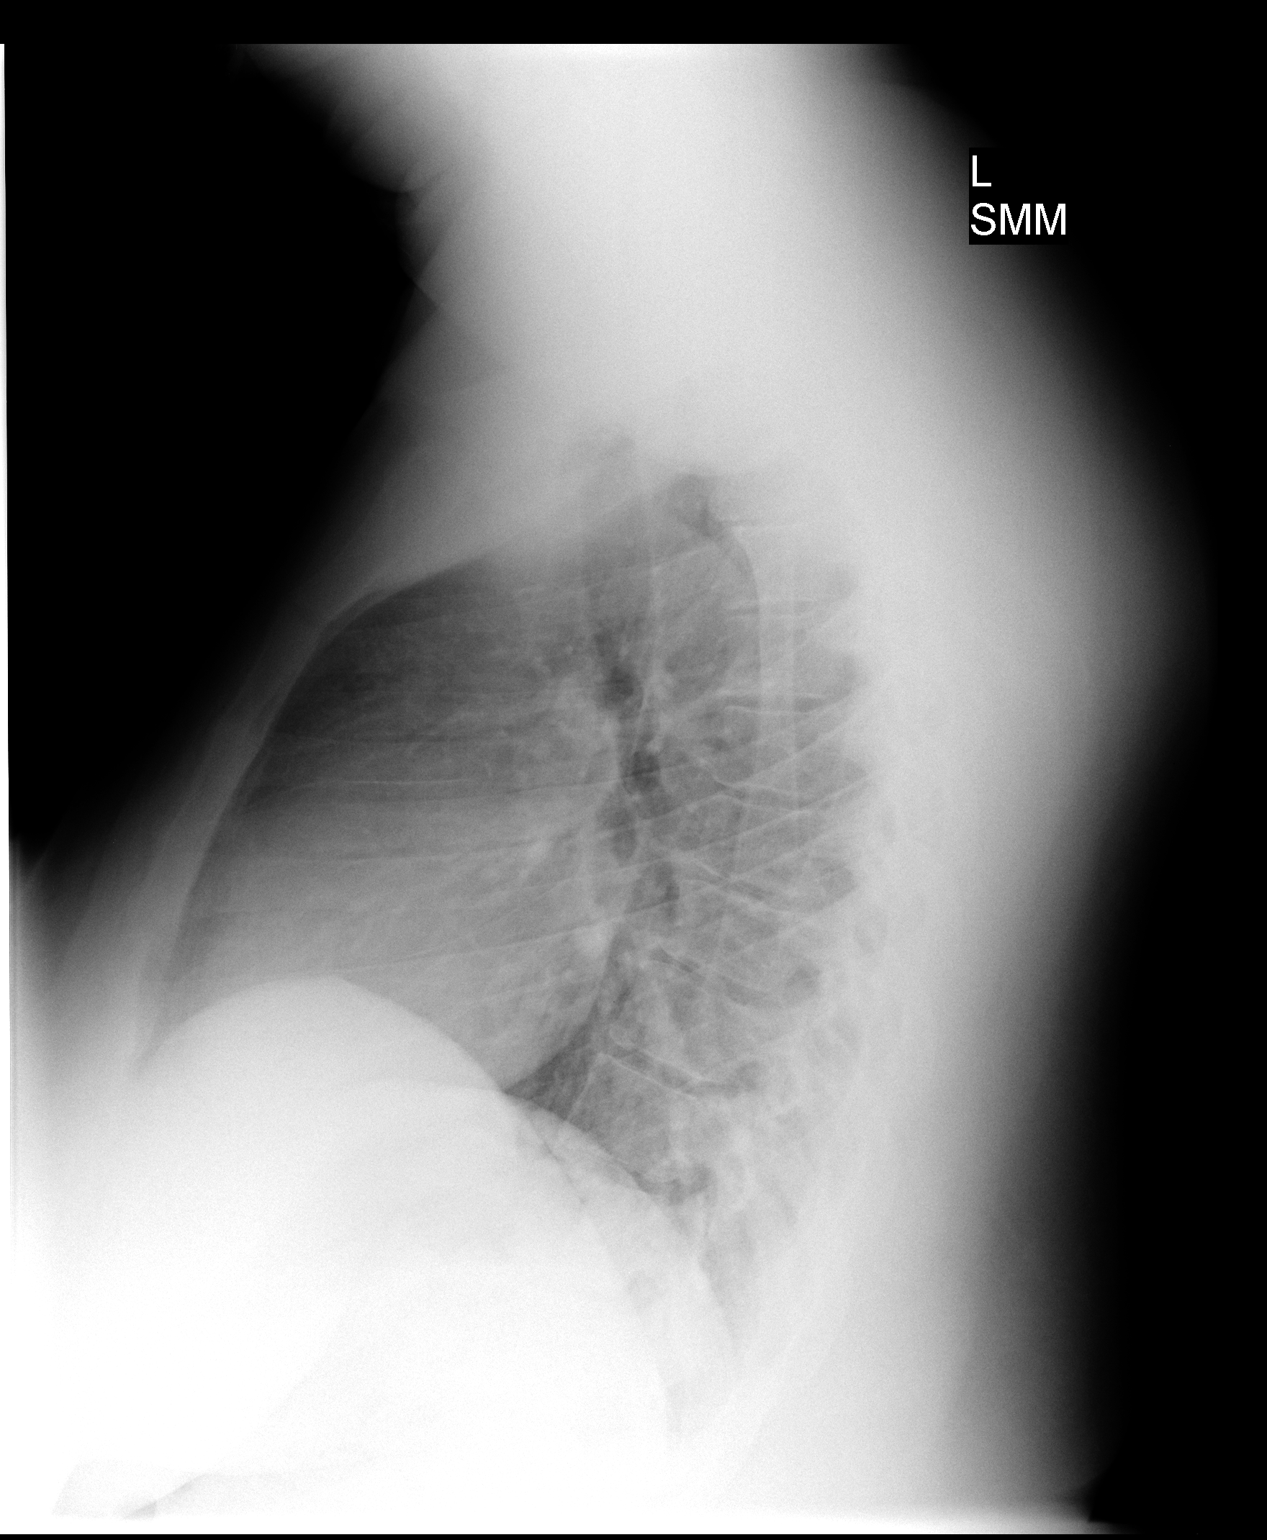

[2 of 2 positions shown; findings below may reference images not displayed]

FINDINGS: Lungs are clear.  Heart size is normal.  Mediastinum,
hila, pleura and osseous structures are stable and unremarkable.
Specifically no displaced rib fracture or
pneumothorax/pneumomediastinum or pleural fluid seen.
IMPRESSION: Stable - normal.

## 2009-08-21 ENCOUNTER — Ambulatory Visit: Payer: Self-pay | Admitting: Nurse Practitioner

## 2009-10-18 ENCOUNTER — Ambulatory Visit: Payer: Self-pay | Admitting: Nurse Practitioner

## 2009-10-18 LAB — CONVERTED CEMR LAB
AST: 14 units/L (ref 0–37)
Albumin: 3.7 g/dL (ref 3.5–5.2)
BUN: 10 mg/dL (ref 6–23)
Blood Glucose, Fingerstick: 77
CO2: 24 meq/L (ref 19–32)
Calcium: 8.7 mg/dL (ref 8.4–10.5)
Cholesterol: 168 mg/dL (ref 0–200)
Creatinine, Ser: 0.93 mg/dL (ref 0.40–1.20)
Glucose, Bld: 77 mg/dL (ref 70–99)
HCT: 35.7 % — ABNORMAL LOW (ref 36.0–46.0)
Hemoglobin: 11.9 g/dL — ABNORMAL LOW (ref 12.0–15.0)
LDL Cholesterol: 104 mg/dL — ABNORMAL HIGH (ref 0–99)
MCHC: 33.3 g/dL (ref 30.0–36.0)
MCV: 86.4 fL (ref 78.0–100.0)
Monocytes Absolute: 0.4 10*3/uL (ref 0.1–1.0)
Neutrophils Relative %: 53 % (ref 43–77)
Platelets: 208 10*3/uL (ref 150–400)
RBC: 4.13 M/uL (ref 3.87–5.11)
RDW: 14.6 % (ref 11.5–15.5)
Rapid HIV Screen: NEGATIVE
TSH: 1.267 microintl units/mL (ref 0.350–4.500)
Total Bilirubin: 0.3 mg/dL (ref 0.3–1.2)
Total CHOL/HDL Ratio: 3.1
Triglycerides: 49 mg/dL (ref <150)
VLDL: 10 mg/dL (ref 0–40)
WBC: 5.8 10*3/uL (ref 4.0–10.5)

## 2009-10-19 ENCOUNTER — Encounter (INDEPENDENT_AMBULATORY_CARE_PROVIDER_SITE_OTHER): Payer: Self-pay | Admitting: Nurse Practitioner

## 2009-10-31 ENCOUNTER — Emergency Department (HOSPITAL_COMMUNITY): Admission: EM | Admit: 2009-10-31 | Discharge: 2009-10-31 | Payer: Self-pay | Admitting: Emergency Medicine

## 2009-11-01 ENCOUNTER — Encounter (INDEPENDENT_AMBULATORY_CARE_PROVIDER_SITE_OTHER): Payer: Self-pay | Admitting: Nurse Practitioner

## 2009-11-09 ENCOUNTER — Ambulatory Visit: Payer: Self-pay | Admitting: Nurse Practitioner

## 2009-11-09 LAB — CONVERTED CEMR LAB
Bilirubin Urine: NEGATIVE
Blood Glucose, Fingerstick: 139
Chlamydia, DNA Probe: NEGATIVE
Glucose, Urine, Semiquant: NEGATIVE
Ketones, urine, test strip: NEGATIVE
Nitrite: NEGATIVE
Specific Gravity, Urine: >=1.03
pH: 5.5

## 2009-11-14 ENCOUNTER — Encounter (INDEPENDENT_AMBULATORY_CARE_PROVIDER_SITE_OTHER): Payer: Self-pay | Admitting: Nurse Practitioner

## 2010-03-20 ENCOUNTER — Telehealth (INDEPENDENT_AMBULATORY_CARE_PROVIDER_SITE_OTHER): Payer: Self-pay | Admitting: Nurse Practitioner

## 2010-03-22 ENCOUNTER — Encounter (INDEPENDENT_AMBULATORY_CARE_PROVIDER_SITE_OTHER): Payer: Self-pay | Admitting: Nurse Practitioner

## 2010-04-13 ENCOUNTER — Ambulatory Visit: Payer: Self-pay | Admitting: Nurse Practitioner

## 2010-04-13 DIAGNOSIS — N898 Other specified noninflammatory disorders of vagina: Secondary | ICD-10-CM | POA: Insufficient documentation

## 2010-04-13 LAB — CONVERTED CEMR LAB: GC Probe Amp, Genital: NEGATIVE

## 2010-04-16 ENCOUNTER — Telehealth (INDEPENDENT_AMBULATORY_CARE_PROVIDER_SITE_OTHER): Payer: Self-pay | Admitting: Nurse Practitioner

## 2010-04-27 ENCOUNTER — Telehealth (INDEPENDENT_AMBULATORY_CARE_PROVIDER_SITE_OTHER): Payer: Self-pay | Admitting: Nurse Practitioner

## 2010-06-26 NOTE — Progress Notes (Signed)
Summary: Lab results  Phone Note Outgoing Call   Summary of Call: Notify pt that her labs are normal.  she had STD testing  Initial call taken by: Lehman Prom FNP,  April 16, 2010 8:06 AM  Follow-up for Phone Call        Left message on answering machine for pt to call back......Marland KitchenArmenia Shannon  April 16, 2010 9:22 AM  PT IS AWARE.... [place] SHANNON

## 2010-06-26 NOTE — Letter (Signed)
Summary: Lipid Letter  [facility]  [address] [place], [place] [address]   Phone: [phone]  Fax: [phone]    10/19/2009  Dion Telford [address] [place], [place]  [address]  Dear Briscoe Deutscher:  We have carefully reviewed your last lipid profile from 10/18/2009 and the results are noted below with a summary of recommendations for lipid management.    Cholesterol:       168     Goal: less thn 200   HDL "good" Cholesterol:   54     Goal: greater than 40   LDL "bad" Cholesterol:   104     Goal: less than 100   Triglycerides:       49     Goal: less than 150    Labs done during recent office visit are normal     Current Medications: 1)    Metformin Hcl 500 Mg Tabs (Metformin hcl) .Marland Kitchen.. 1 tablet by mouth three times a day 2)    Triamcinolone Acetonide 0.1 %  Oint (Triamcinolone acetonide) .... Mix 1:1 with vasoline. apply to affected areas two times a day 3)    Yasmin 28 3-0.03 Mg Tabs (Drospirenone-ethinyl estradiol) .... One tablet by mouth daily 4)    Lisinopril 20 Mg Tabs (Lisinopril) .... One tablet by mouth daily **note change in dose** 5)    Ferrous Sulfate 325 (65 Fe) Mg Tabs (Ferrous sulfate) .... One tablet by mouth daily for anemia 6)    Imitrex 100 Mg Tabs (Sumatriptan succinate) .... One tablet by mouth at onset of headache may repeat in 2 hours if headache continues  If you have any questions, please call. We appreciate being able to work with you.   Sincerely,    [facility] Lehman Prom FNP

## 2010-06-26 NOTE — Assessment & Plan Note (Signed)
Summary: HTN   Vital Signs:  Patient profile:   30 year old female LMP:     07/2009 Height:      67.50 inches Weight:      264.6 pounds BMI:     40.98 BSA:     2.29 Temp:     98.1 degrees F oral Pulse rate:   67 / minute Pulse rhythm:   regular Resp:     16 per minute BP sitting:   137 / 88  (left arm) Cuff size:   large  Vitals Entered By: Levon Hedger (August 07, 2009 9:27 AM) CC: BP issues, Hypertension Management Is Patient Diabetic? No Pain Assessment Patient in pain? no      CBG Result 89  Does patient need assistance? Functional Status Self care Ambulation Normal LMP (date): 07/2009 LMP - Character: heavy     Enter LMP: 07/2009 Last PAP Result  Specimen Adequacy: Satisfactory for evaluation.   Interpretation/Result:Negative for intraepithelial Lesion or Malignancy.   Interpretation/Result:Shift in flora c/w Bacterial Vaginosis.     CC:  BP issues and Hypertension Management.  History of Present Illness:  Pt into the office for f/u on htn. Pt has been taking lisinopril 10mg  by mouth daily.  She has been going to the dentist and her blood pressure has been elevated despite taking her BP medications. Strong family history of mother and father with HTN Pt has to get in excess of 8 teeth removed.  She wil be fitted for partial.  She is not allowed to return until she gets her blood pressure controlled.  PCOS - still taking metformin and Oral contraceptives  Hypertension History:      She denies headache, chest pain, and palpitations.  She notes no problems with any antihypertensive medication side effects.  Pt has been going to the dental clinic and reports that her blood pressure has been elevated.  She has been taking lisinopril 10mg  by mouth daily.        Positive major cardiovascular risk factors include hypertension.  Negative major cardiovascular risk factors include female age less than 82 years old, negative family history for ischemic heart disease,  and non-tobacco-user status.        Further assessment for target organ damage reveals no history of ASHD, cardiac end-organ damage (CHF/LVH), stroke/TIA, peripheral vascular disease, renal insufficiency, or hypertensive retinopathy.     Allergies (verified): No Known Drug Allergies  Review of Systems General:  Denies fever. CV:  Denies chest pain or discomfort. Resp:  Denies cough. GI:  Denies abdominal pain, nausea, and vomiting. Neuro:  Denies headaches.  Physical Exam  General:  alert.   Head:  normocephalic.   Mouth:  pharynx pink and moist and poor dentition.   Lungs:  normal breath sounds.   Heart:  normal rate and regular rhythm.   Abdomen:  normal bowel sounds.   Neurologic:  alert & oriented X3.     Impression & Recommendations:  Problem # 1:  HYPERTENSION, BENIGN ESSENTIAL (ICD-401.1) DASH diet increase lisinopril to 20mg  daily Her updated medication list for this problem includes:    Lisinopril 20 Mg Tabs (Lisinopril) ..... One tablet by mouth daily **note change in dose**  Orders: T-Urinalysis ([address]-60[address]) T-Urine Microalbumin w/creat. ratio [phone])  Problem # 2:  POLYCYSTIC OVARIAN DISEASE (ICD-256.4) stable continue current meds Orders: Capillary Blood Glucose/CBG (21308)  Complete Medication List: 1)  Metformin Hcl 500 Mg Tabs (Metformin hcl) .Marland Kitchen.. 1 tablet by mouth three times a day 2)  Triamcinolone  Acetonide 0.1 % Oint (Triamcinolone acetonide) .... Mix 1:1 with vasoline. apply to affected areas two times a day 3)  Yasmin 28 3-0.03 Mg Tabs (Drospirenone-ethinyl estradiol) .... One tablet by mouth daily 4)  Lisinopril 20 Mg Tabs (Lisinopril) .... One tablet by mouth daily **note change in dose** 5)  Ferrous Sulfate 325 (65 Fe) Mg Tabs (Ferrous sulfate) .... One tablet by mouth daily for anemia  Hypertension Assessment/Plan:      The patient's hypertensive risk group is category A: No risk factors and no target organ damage.  Her  calculated 10 year risk of coronary heart disease is 1 %.  Today's blood pressure is 137/88.  Her blood pressure goal is < 140/90.  Patient Instructions: 1)  Blood pressure - increase the dose to 20mg  by mouth daily. 2)  Limit your sodium intake 3)  Follow up next week for a blood pressure check - nurse visit. 4)  Take your medications before this visit 5)  Be advised that your birth control pills may be contributing to high blood pressure. If it continues to be a problem may need to re-consider birth control method Prescriptions: LISINOPRIL 20 MG TABS (LISINOPRIL) One tablet by mouth daily **Note change in dose**  #30 x 3   Entered and Authorized by:   Lehman Prom FNP   Signed by:   Lehman Prom FNP on 08/07/2009   Method used:   Print then Give to Patient   RxID:   [id number]

## 2010-06-26 NOTE — Progress Notes (Signed)
Summary: Change meds to mail order?  Phone Note Outgoing Call   Summary of Call: Received request for meds to be changed to [facility].  Does she want to change from [facility]?  Left message on answering machine for pt. to return call.   Initial call taken by: Dutch Quint RN,  April 27, 2010 4:37 PM  Follow-up for Phone Call        Left message on answer machine for pt. to return call. Gaylyn Cheers RN  April 30, 2010 11:04 AM      Additional Follow-up for Phone Call Additional follow up Details #1::        yes...she just wants to change the lisinopril, metformin, triamcinolone cream. Her drug plan requires her to use mail order for recurrent medications.  Additional Follow-up by: Hassell Halim CMA,  April 30, 2010 12:32 PM    Additional Follow-up for Phone Call Additional follow up Details #2::    New Rx refills faxed to [facility].  Dutch Quint RN  May 04, 2010 2:19 PM   ``Prescriptions: LISINOPRIL 20 MG TABS (LISINOPRIL) One tablet by mouth daily **Note change in dose**  #90 x 4   Entered by:   Dutch Quint RN   Authorized by:   Lehman Prom FNP   Signed by:   Dutch Quint RN on 05/04/2010   Method used:   Historical   RxID:   [id number] TRIAMCINOLONE ACETONIDE 0.1 %  OINT (TRIAMCINOLONE ACETONIDE) Mix 1:1 with vasoline. apply to affected areas two times a day  #90-day suppl x 4   Entered by:   Dutch Quint RN   Authorized by:   Lehman Prom FNP   Signed by:   Dutch Quint RN on 05/04/2010   Method used:   Historical   RxID:   [id number] METFORMIN HCL 500 MG TABS (METFORMIN HCL) 1 tablet by mouth three times a day  #270 x 4   Entered by:   Dutch Quint RN   Authorized by:   Lehman Prom FNP   Signed by:   Dutch Quint RN on 05/04/2010   Method used:   Historical   RxID:   [id number]

## 2010-06-26 NOTE — Assessment & Plan Note (Signed)
Summary: STD testing   Vital Signs:  Patient profile:   30 year old female Height:      67.50 inches Weight:      279.9 pounds BMI:     43.35 Temp:     98.0 degrees F oral Pulse rate:   88 / minute Pulse rhythm:   regular Resp:     18 per minute BP sitting:   130 / 94  (left arm) Cuff size:   large  Vitals Entered By: Levon Hedger (April 13, 2010 11:27 AM)  Nutrition Counseling: Patient's BMI is greater than 25 and therefore counseled on weight management options. CC: pt says she has a rash on her vagina... pt says her and her husband just got back together and she wants to make sure everything is ok... Is Patient Diabetic? No Pain Assessment Patient in pain? no       Does patient need assistance? Functional Status Self care Ambulation Normal   CC:  pt says she has a rash on her vagina... pt says her and her husband just got back together and she wants to make sure everything is ok....  History of Present Illness: Pt into the office with request of STD testing  Vaginal itching Scant discharge Some swelling on labia  Pt has used some OTC cream  pt did get married since her last visit here.  She reports that she did have a brief seperation and was unsure of the faithfulness of her husband.  Although they are back together she does want testing  Dental problems - pt had 5 upper teeth extracted. She will return on next week for remaining upper teeth to be extracted and she will get a "flipper" until February until she can get a partial  Family Planning - Hx of PCOS Pt is on birth control and metformin  Obesity - pt is aware that she is over weight and is interested in losing weigh.  Asking about Bee Pollen products  Current Medications (verified): 1)  Metformin Hcl 500 Mg Tabs (Metformin Hcl) .Marland Kitchen.. 1 Tablet By Mouth Three Times A Day 2)  Triamcinolone Acetonide 0.1 %  Oint (Triamcinolone Acetonide) .... Mix 1:1 With Vasoline. Apply To Affected Areas Two Times A  Day 3)  Yasmin 28 3-0.03 Mg Tabs (Drospirenone-Ethinyl Estradiol) .... One Tablet By Mouth Daily 4)  Lisinopril 20 Mg Tabs (Lisinopril) .... One Tablet By Mouth Daily **note Change in Dose** 5)  Ferrous Sulfate 325 (65 Fe) Mg Tabs (Ferrous Sulfate) .... One Tablet By Mouth Daily For Anemia 6)  Imitrex 100 Mg Tabs (Sumatriptan Succinate) .... One Tablet By Mouth At Onset of Headache May Repeat in 2 Hours If Headache Continues  Allergies (verified): No Known Drug Allergies  Review of Systems General:  Denies fever. CV:  Denies chest pain or discomfort. Resp:  Denies cough. GI:  Denies abdominal pain, nausea, and vomiting. Derm:  started using some mary kay products and she noticed that her face started to break out. Neuro:  Complains of headaches; about 2-3 per month.  Physical Exam  General:  alert.   Head:  normocephalic.   Ears:  ear piercing(s) noted.   Mouth:  poor dentition.   Lungs:  normal breath sounds.   Heart:  normal rate and regular rhythm.     Impression & Recommendations:  Problem # 1:  HYPERTENSION, BENIGN ESSENTIAL (ICD-401.1) BP stable Continue current medications Her updated medication list for this problem includes:    Lisinopril 20 Mg Tabs (Lisinopril) .Marland KitchenMarland KitchenMarland KitchenMarland Kitchen  One tablet by mouth daily **note change in dose**  Problem # 2:  SEXUALLY TRANSMITTED DISEASE, EXPOSURE TO (ICD-V01.6)  Problem # 3:  OBESITY (ICD-278.00) Pt is trying to decrease weight and is trying to exercise and monitor her diet  Problem # 4:  POLYCYSTIC OVARIAN DISEASE (ICD-256.4) pt to continue medications  Problem # 5:  NEED PROPHYLACTIC VACCINATION&INOCULATION FLU (ICD-V04.81) given today  Complete Medication List: 1)  Metformin Hcl 500 Mg Tabs (Metformin hcl) .Marland Kitchen.. 1 tablet by mouth three times a day 2)  Triamcinolone Acetonide 0.1 % Oint (Triamcinolone acetonide) .... Mix 1:1 with vasoline. apply to affected areas two times a day 3)  Yasmin 28 3-0.03 Mg Tabs (Drospirenone-ethinyl  estradiol) .... One tablet by mouth daily 4)  Lisinopril 20 Mg Tabs (Lisinopril) .... One tablet by mouth daily **note change in dose** 5)  Ferrous Sulfate 325 (65 Fe) Mg Tabs (Ferrous sulfate) .... One tablet by mouth daily for anemia 6)  Imitrex 100 Mg Tabs (Sumatriptan succinate) .... One tablet by mouth at onset of headache may repeat in 2 hours if headache continues  Other Orders: T- GC Chlamydia (82956) KOH/ W[place] [phone])  Patient Instructions: 1)  You have been given the flu vaccine today. 2)  You will be notified of your lab results. 3)  Follow up at least every 6 months or sooner if necessary Prescriptions: IMITREX 100 MG TABS (SUMATRIPTAN SUCCINATE) One tablet by mouth at onset of headache may repeat in 2 hours if headache continues  #10 x 0   Entered and Authorized by:   Lehman Prom FNP   Signed by:   Lehman Prom FNP on 04/13/2010   Method used:   Print then Give to Patient   RxID:   [id number]    Orders Added: 1)  T- GC Chlamydia [24401] 2)  Est. Patient Level III [02725] 3)  KOH/ [facility] [36644]    Prevention & Chronic Care Immunizations   Influenza vaccine: Not documented    Tetanus booster: 10/02/2007: Tdap    Pneumococcal vaccine: Not documented  Other Screening   Pap smear:  Specimen Adequacy: Satisfactory for evaluation.   Interpretation/Result:Negative for intraepithelial Lesion or Malignancy.     (11/09/2009)   Pap smear due: 11/2010   Smoking status: never  (11/09/2009)  Hypertension   Last Blood Pressure: 130 / 94  (04/13/2010)   Serum creatinine: 0.93  (10/18/2009)   Serum potassium 4.2  (10/18/2009)  Self-Management Support :    Hypertension self-management support: Not documented   Nursing Instructions: Give Flu vaccine today   Appended Document: STD testing     Allergies: No Known Drug Allergies   Complete Medication List: 1)  Metformin Hcl 500 Mg Tabs (Metformin hcl) .Marland Kitchen.. 1 tablet by mouth three times  a day 2)  Triamcinolone Acetonide 0.1 % Oint (Triamcinolone acetonide) .... Mix 1:1 with vasoline. apply to affected areas two times a day 3)  Yasmin 28 3-0.03 Mg Tabs (Drospirenone-ethinyl estradiol) .... One tablet by mouth daily 4)  Lisinopril 20 Mg Tabs (Lisinopril) .... One tablet by mouth daily **note change in dose** 5)  Ferrous Sulfate 325 (65 Fe) Mg Tabs (Ferrous sulfate) .... One tablet by mouth daily for anemia 6)  Imitrex 100 Mg Tabs (Sumatriptan succinate) .... One tablet by mouth at onset of headache may repeat in 2 hours if headache continues  Other Orders: Flu Vaccine 93yrs + (03474) Admin 1st Vaccine (25956)   Orders Added: 1)  Flu Vaccine 56yrs + [38756] 2)  Admin 1st Vaccine [90471]   Immunizations Administered:  Influenza Vaccine # 1:    Vaccine Type: Fluvax 3+    Site: left deltoid    Mfr: GlaxoSmithKline    Dose: 0.5 ml    Route: IM    Given by: Levon Hedger    Exp. Date: 11/24/2010    Lot #: [id number]    VIS given: 12/19/09 version given April 13, 2010.  Flu Vaccine Consent Questions:    Do you have a history of severe allergic reactions to this vaccine? no    Any prior history of allergic reactions to egg and/or gelatin? no    Do you have a sensitivity to the preservative Thimersol? no    Do you have a past history of Guillan-Barre Syndrome? no    Do you currently have an acute febrile illness? no    Have you ever had a severe reaction to latex? no    Vaccine information given and explained to patient? yes    Are you currently pregnant? no    ndc  [phone]  Immunizations Administered:  Influenza Vaccine # 1:    Vaccine Type: Fluvax 3+    Site: left deltoid    Mfr: GlaxoSmithKline    Dose: 0.5 ml    Route: IM    Given by: Levon Hedger    Exp. Date: 11/24/2010    Lot #: [id number]    VIS given: 12/19/09 version given April 13, 2010.

## 2010-06-26 NOTE — Progress Notes (Signed)
Summary: Office Visit//DEPRESSION SCREENING  Office Visit//DEPRESSION SCREENING   Imported By: Arta Bruce 11/10/2009 09:55:40  _____________________________________________________________________  External Attachment:    Type:   Image     Comment:   External Document

## 2010-06-26 NOTE — Letter (Signed)
Summary: *HSN Results Follow up  [facility]  [address] [place], [place] [address]   Phone: [phone]  Fax: [phone]      11/14/2009   [facility] [address] [place], [place]  [address]   Dear  Ms. Angles Nienaber,                            ____S.Drinkard,FNP   ____D. Gore,FNP       ____B. McPherson,MD   ____V. Rankins,MD    ____E. Mulberry,MD    _X___N. Daphine Deutscher, FNP  ____D. Reche Dixon, MD    ____K. Philipp Deputy, MD    ____Other     This letter is to inform you that your recent test(s):  ___X____Pap Smear    _______Lab Test     _______X-ray    ___X____ is within acceptable limits  _______ requires a medication change  _______ requires a follow-up lab visit  _______ requires a follow-up visit with your provider   Comments:  Pap Smear results normal.       _________________________________________________________ If you have any questions, please contact our office [phone].                    Sincerely,    Lehman Prom FNP [facility]

## 2010-06-26 NOTE — Letter (Signed)
Summary: Work Excuse  [facility]  [address] [place], [place] [address]   Phone: [phone]  Fax: [phone]    Today's Date: April 13, 2010  Name of Patient: [facility]  The above named patient had a medical visit today   Please take this into consideration when reviewing the time away from work  Special Instructions:  [  ] None  [ X ] To be off the remainder of today, returning to the normal work schedule tomorrow.  [  ] To be off until the next scheduled appointment on ______________________.  [  ] Other ________________________________________________________________ ________________________________________________________________________   Sincerely yours,   Lehman Prom FNP [facility]

## 2010-06-26 NOTE — Letter (Signed)
Summary: Generic Letter  [facility]  [address] [place], [place] [address]   Phone: [phone]  Fax: [phone]        03/22/2010  [facility] [address] [place], [place]  [address]  Dear Ms. Engelson,  We have been unable to contact you by telephone.  Please call our office, at your earliest convenience, so that we may speak with you.  Sincerely,   Dutch Quint RN

## 2010-06-26 NOTE — Assessment & Plan Note (Signed)
Summary: Complete Physical Exam    Vital Signs:  Patient profile:   30 year old female LMP:     10/20/2009 Weight:      267.2 pounds BMI:     41.38 Temp:     97.9 degrees F oral Pulse rate:   88 / minute Pulse rhythm:   regular Resp:     20 per minute BP sitting:   175 / 100  (left arm) Cuff size:   large  Vitals Entered By: Levon Hedger (November 09, 2009 2:44 PM)  Nutrition Counseling: Patient's BMI is greater than 25 and therefore counseled on weight management options.  Serial Vital Signs/Assessments:  Time      Position  BP       Pulse  Resp  Temp     By 3:52 PM             146/100                        Lehman Prom FNP  CC: CPP, Hypertension Management Is Patient Diabetic? No Pain Assessment Patient in pain? no      CBG Result 139 CBG Device ID A  Does patient need assistance? Functional Status Self care Ambulation Normal LMP (date): 10/20/2009 LMP - Character: heavy     Enter LMP: 10/20/2009 Last PAP Result  Specimen Adequacy: Satisfactory for evaluation.   Interpretation/Result:Negative for intraepithelial Lesion or Malignancy.   Interpretation/Result:Shift in flora c/w Bacterial Vaginosis.     CC:  CPP and Hypertension Management.  History of Present Illness:  Pt into the office for a complete physical exam  PAP - last done in this office last year Previous abnormal PAP a few years back but no intervention no children  Mammogram - no history of mammogram No family history of breast cancer  Optho - no current glasses or contacts though admits that she needs a vision exam for difficulty viewing objects far away  Dental - pt had referral on last visit to the dental clinic but she has not been notified of the time/date. She is still trying to get into the dental clinic.  Obesity - down 5 pounds since her last visit. Pt admits that her diet has changed with her new position  Hypertension History:      She complains of headache, but denies  chest pain and palpitations.  She notes no problems with any antihypertensive medication side effects.  BP is elevated today but pt has not taken meds in 2 days.        Positive major cardiovascular risk factors include hypertension.  Negative major cardiovascular risk factors include female age less than 70 years old, negative family history for ischemic heart disease, and non-tobacco-user status.        Further assessment for target organ damage reveals no history of ASHD, cardiac end-organ damage (CHF/LVH), stroke/TIA, peripheral vascular disease, renal insufficiency, or hypertensive retinopathy.    Habits & Providers  Alcohol-Tobacco-Diet     Alcohol drinks/day: <1     Alcohol Counseling: social     Alcohol type: alcohol     Tobacco Status: never  Exercise-Depression-Behavior     Does Patient Exercise: no     Exercise Counseling: to improve exercise regimen     Depression Counseling: not indicated; screening negative for depression     Drug Use: no     Seat Belt Use: 100     Sun Exposure: occasionally  Comments: PHQ-9 score =  1  Allergies (verified): No Known Drug Allergies  Review of Systems General:  Denies fever. Eyes:  Denies blurring. ENT:  Denies earache. CV:  Denies chest pain or discomfort. Resp:  Denies cough. GI:  Denies abdominal pain. GU:  Denies discharge. MS:  Denies joint pain. Derm:  Complains of itching; denies rash; bil feet with peeling skin on lateral aspects - similar problem last year that improved during the fall. Neuro:  Complains of headaches; intermittent. Psych:  Denies anxiety and depression.  Physical Exam  General:  alert.   Head:  normocephalic.   Eyes:  pupils equal and pupils round.   Ears:  bil TM with bony landmarks  present no erythema Nose:  no nasal discharge.   Mouth:  poor dentation mulitple caries Neck:  supple.   Chest Wall:  no deformities.   Breasts:  skin/areolae normal, no masses, and no abnormal thickening.   pendulous Lungs:  normal breath sounds.   Heart:  normal rate and regular rhythm.   Abdomen:  soft, non-tender, and normal bowel sounds.   Rectal:  no external abnormalities.   Msk:  up to the exam table Pulses:  R radial normal and L radial normal.   Extremities:  no edema Neurologic:  alert & oriented X3.   Skin:  color normal.   bil feet with lateral heels with peeling skin Psych:  Oriented X3.    Pelvic Exam  Vulva:      normal appearance.   Urethra and Bladder:      Urethra--normal.  Bladder--normal.   Vagina:      physiologic discharge.   Cervix:      midposition.   Uterus:      smooth.   Adnexa:      nontender bilaterally.   Rectum:      normal.      Impression & Recommendations:  Problem # 1:  ROUTINE GYNECOLOGICAL EXAMINATION (ICD-V72.31) PAP done labs reviewed from previous visit rec optho exam Pt has upcoming dental exam  PHQ-9 score = 1 Orders: UA Dipstick w/o Micro (manual) (16109) KOH/ WET Mount [phone]) Pap Smear, Thin Prep ( Collection of) (Q0091) T- GC Chlamydia (09811)  Problem # 2:  HYPERTENSION, BENIGN ESSENTIAL (ICD-401.1) BP is elevated today Her updated medication list for this problem includes:    Lisinopril 20 Mg Tabs (Lisinopril) ..... One tablet by mouth daily **note change in dose**  Complete Medication List: 1)  Metformin Hcl 500 Mg Tabs (Metformin hcl) .Marland Kitchen.. 1 tablet by mouth three times a day 2)  Triamcinolone Acetonide 0.1 % Oint (Triamcinolone acetonide) .... Mix 1:1 with vasoline. apply to affected areas two times a day 3)  Yasmin 28 3-0.03 Mg Tabs (Drospirenone-ethinyl estradiol) .... One tablet by mouth daily 4)  Lisinopril 20 Mg Tabs (Lisinopril) .... One tablet by mouth daily **note change in dose** 5)  Ferrous Sulfate 325 (65 Fe) Mg Tabs (Ferrous sulfate) .... One tablet by mouth daily for anemia 6)  Imitrex 100 Mg Tabs (Sumatriptan succinate) .... One tablet by mouth at onset of headache may repeat in 2 hours if headache  continues  Other Orders: Capillary Blood Glucose/CBG (91478)  Hypertension Assessment/Plan:      The patient's hypertensive risk group is category A: No risk factors and no target organ damage.  Her calculated 10 year risk of coronary heart disease is 1 %.  Today's blood pressure is 175/100.  Her blood pressure goal is < 140/90.   Patient Instructions: 1)  Your blood pressure  was high today but you have not taken your medication in 2 days 2)  Be sure to take your blood pressure medications DAILY as ordered 3)  Follow up as needed Prescriptions: TRIAMCINOLONE ACETONIDE 0.1 %  OINT (TRIAMCINOLONE ACETONIDE) Mix 1:1 with vasoline. apply to affected areas two times a day  #16 ounces x 0   Entered and Authorized by:   Lehman Prom FNP   Signed by:   Lehman Prom FNP on 11/09/2009   Method used:   Print then Give to Patient   RxID:   [id number]     Laboratory Results   Urine Tests  Date/Time Received: November 09, 2009 3:32 PM   Routine Urinalysis   Color: yellow Appearance: Clear Glucose: negative   (Normal Range: Negative) Bilirubin: negative   (Normal Range: Negative) Ketone: negative   (Normal Range: Negative) Spec. Gravity: >=1.030   (Normal Range: 1.003-1.035) Blood: negative   (Normal Range: Negative) pH: 5.5   (Normal Range: 5.0-8.0) Protein: 30   (Normal Range: Negative) Urobilinogen: 0.2   (Normal Range: 0-1) Nitrite: negative   (Normal Range: Negative) Leukocyte Esterace: negative   (Normal Range: Negative)     Blood Tests     CBG Random:: 139mg /dL  Date/Time Received: November 09, 2009 3:51 PM   [employer] Source: vaginal WBC/hpf: 1-5 Bacteria/hpf: rare Clue cells/hpf: none Yeast/hpf: none Wet Mount KOH: Negative Trichomonas/hpf: none

## 2010-06-26 NOTE — Letter (Signed)
Summary: DENTAL REFFERAL  DENTAL REFFERAL   Imported By: Arta Bruce 11/02/2009 11:53:34  _____________________________________________________________________  External Attachment:    Type:   Image     Comment:   External Document

## 2010-06-26 NOTE — Assessment & Plan Note (Signed)
Summary: Headache   Vital Signs:  Patient profile:   30 year old female LMP:     10/17/2009 Weight:      272.0 pounds BMI:     42.12 BSA:     2.32 Temp:     98.3 degrees F oral Pulse rate:   66 / minute Pulse rhythm:   regular Resp:     16 per minute BP sitting:   141 / 95  (left arm) Cuff size:   large  Vitals Entered By: Levon Hedger (Oct 18, 2009 2:03 PM) CC: Hypertension Management, Headache Pain Assessment Patient in pain? no      CBG Result 77 CBG Device ID B  Does patient need assistance? Functional Status Self care Ambulation Normal LMP (date): 10/17/2009 LMP - Character: heavy     Enter LMP: 10/17/2009 Last PAP Result  Specimen Adequacy: Satisfactory for evaluation.   Interpretation/Result:Negative for intraepithelial Lesion or Malignancy.   Interpretation/Result:Shift in flora c/w Bacterial Vaginosis.     CC:  Hypertension Management and Headache.  History of Present Illness:  Pt was orginally scheduled for a complete physical exam however her menses started on last night and pt is unable to get the PAP portion. She is fasting and will get labs done today.  Still getting cluster headache Pt is no longer working in the smoking environment so she thought headaches would improve when she gets the headache she goes into a dark room and goes to sleep.  when she wakes the headache is gone. No medications get rid of the pain - tylenol or ibuprofen  Occurence is about 2-3 times per month and no matter the time of day    Headache HPI:      The patient comes in for chronic management of stable headaches.  The headaches will last anywhere from 30 minutes to several days at a time.  She has approximately 2 headaches per month.  Headaches have been occurring since age 40.  There is no family history of migraine headaches.        The headaches are associated with an aura.  The location of the headaches are unilateral-left.  Headache quality is throbbing or  pulsating.  The headaches are associated with photophobia and tearing of the eyes.         Hypertension History:      She denies headache, chest pain, and palpitations.  pt has not taken her BP meds yet today due to fasting status.        Positive major cardiovascular risk factors include hypertension.  Negative major cardiovascular risk factors include female age less than 2 years old, negative family history for ischemic heart disease, and non-tobacco-user status.        Further assessment for target organ damage reveals no history of ASHD, cardiac end-organ damage (CHF/LVH), stroke/TIA, peripheral vascular disease, renal insufficiency, or hypertensive retinopathy.      Allergies (verified): No Known Drug Allergies  Review of Systems CV:  Denies chest pain or discomfort. Resp:  Denies cough. GI:  Denies abdominal pain, nausea, and vomiting. Neuro:  Complains of headaches.  Physical Exam  General:  alert.   Head:  normocephalic.   Msk:  up to the exam table Neurologic:  alert & oriented X3.   Skin:  color normal.   Psych:  Oriented X3.     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 1 tablet by mouth three times a day 2)  Triamcinolone Acetonide 0.1 % Oint (Triamcinolone acetonide) .... Mix 1:1 with vasoline. apply to affected areas two times a day 3)  Yasmin 28 3-0.03 Mg Tabs (Drospirenone-ethinyl estradiol) .... One tablet by mouth daily 4)  Lisinopril 20 Mg Tabs (Lisinopril) .... One tablet by mouth daily **note change in dose** 5)  Ferrous Sulfate 325 (65 Fe) Mg Tabs (Ferrous sulfate) .... One tablet by mouth daily for anemia 6)   Imitrex 100 Mg Tabs (Sumatriptan succinate) .... One tablet by mouth at onset of headache may repeat in 2 hours if headache continues  Other Orders: Capillary Blood Glucose/CBG (29562) T-Lipid Profile (13086-57846) T-Comprehensive Metabolic Panel [phone]) T-CBC w/Diff (24401-02725) T-TSH (36644-03474) Rapid HIV  (25956)  Hypertension Assessment/Plan:      The patient's hypertensive risk group is category A: No risk factors and no target organ damage.  Her calculated 10 year risk of coronary heart disease is 1 %.  Today's blood pressure is 141/95.  Her blood pressure goal is < 140/90.  Patient Instructions: 1)  Schedule an appointment for an eye exam 2)  Headache - keep a headache diary for the next month. Make note of all the times you have headaches and if you take medication and the results 3)  Take imitrex at onset of headache 4)  Reschedule an appointment for a complete physical exam Prescriptions: IMITREX 100 MG TABS (SUMATRIPTAN SUCCINATE) One tablet by mouth at onset of headache may repeat in 2 hours if headache continues  #10 x 0   Entered and Authorized by:   Lehman Prom FNP   Signed by:   Lehman Prom FNP on 10/18/2009   Method used:   Print then Give to Patient   RxID:   [id number]   Laboratory Results   Blood Tests     CBG Random:: 77mg /dL  Date/Time Received: Oct 18, 2009   Other Tests  Rapid HIV: negative

## 2010-06-26 NOTE — Progress Notes (Signed)
Summary: NEEDING REFILLS  Phone Note Call from Patient Call back at [facility] Phone [phone]   Reason for Call: Refill Medication Summary of Call: Donna Burns. MS Clyatt STOPPED BY TO SAY SHE NEEDS REFILLS ON  METFORMIN, LISINOPRIL, YASMIN, FERROUS SULFATE AND TRIAMCINOLONE ACETONITE 0.1% FOR HER FEET. Initial call taken by: Leodis Rains,  March 20, 2010 4:25 PM  Follow-up for Phone Call        Do you want her meds refilled?  Seems erratic in refills.  Last seen 11/09/09.  Dutch Quint RN  March 20, 2010 5:36 PM   Additional Follow-up for Phone Call Additional follow up Details #1::        yes, ok to refill ferrous sulfate is OTC but all others you can refill. verify Burns's pharmacy - think she uses [facility] but unsure which one Additional Follow-up by: Lehman Prom FNP,  March 20, 2010 7:08 PM    Additional Follow-up for Phone Call Additional follow up Details #2::    Unable to leave message.  Dutch Quint RN  March 21, 2010 11:54 AM  Unable to leave message.  Letter sent.  Dutch Quint RN  March 22, 2010 12:03 PM  Call Pharmacy and got new number.  Burns. uses [facility] on [address].  Phone # updated in records.  Dutch Quint RN  March 22, 2010 12:09 PM    Prescriptions: LISINOPRIL 20 MG TABS (LISINOPRIL) One tablet by mouth daily **Note change in dose**  #30 x 3   Entered by:   Dutch Quint RN   Authorized by:   Lehman Prom FNP   Signed by:   Dutch Quint RN on 03/22/2010   Method used:   Electronically to        [facility] [phone]* (retail)       [address]       [address]       [place], [place]  [address]       Ph: [phone]       Fax: [phone]   RxID:   [id number] YASMIN 28 3-0.03 MG TABS (DROSPIRENONE-ETHINYL ESTRADIOL) One tablet by mouth daily  #1 package x 3   Entered by:   Dutch Quint RN   Authorized by:   Lehman Prom FNP   Signed by:   Dutch Quint RN on 03/22/2010   Method used:   Electronically to   [facility] [phone]* (retail)       [address]       [address]       [place], [place]  [address]       Ph: [phone]       Fax: [phone]   RxID:   [id number] TRIAMCINOLONE ACETONIDE 0.1 %  OINT (TRIAMCINOLONE ACETONIDE) Mix 1:1 with vasoline. apply to affected areas two times a day  #16 ounces x 0   Entered by:   Dutch Quint RN   Authorized by:   Lehman Prom FNP   Signed by:   Dutch Quint RN on 03/22/2010   Method used:   Electronically to        [facility] [phone]* (retail)       [address]       [address]       [place], [place]  [address]       Ph: [phone]       Fax: [phone]   RxID:   [id number] METFORMIN HCL 500 MG TABS (METFORMIN HCL) 1  tablet by mouth three times a day  #90.0 Each x 3   Entered by:   Dutch Quint RN   Authorized by:   Lehman Prom FNP   Signed by:   Dutch Quint RN on 03/22/2010   Method used:   Electronically to        [facility] [phone]* (retail)       [address]       [address]       [place], [place]  [address]       Ph: [phone]       Fax: [phone]   RxID:   [id number]

## 2010-10-09 NOTE — Group Therapy Note (Signed)
NAMESUZANN, LAZARO NO.:  [id number]   MEDICAL RECORD NO.:  [id number]          PATIENT TYPE:  WOC   LOCATION:  [facility]                   FACILITY:  [facility]   PHYSICIAN:  Argentina Donovan, MD        DATE OF BIRTH:  April 29, 1981   DATE OF SERVICE:  03/09/2008                                  CLINIC NOTE   The patient is a 30 year old African American female gravida 0, para 0  who was referred by [facility].  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  She weighs 282 pounds.  She is 5 feet 7 inches tall.  She has been diagnosed previously as polycystic ovarian syndrome.  She  does have some hirsutism as well as her irregular periods, and she has  been on metformin but only 500 once a day in the morning.  She had a  period in January and the last one was in July and nothing since.  The  ultrasound showed a 16-cm endometrial stripe, and apparently, this was a  followup after previous one, but no endometrial biopsy had been done as  far as I can see.  The rest of her pelvic anatomy was normal.  So, she  came in today.  We did an endometrial biopsy, sounded her uterus to 8  cm, and she tolerated the procedure very well.  This is an intelligent  woman who knows we were talking about as she apparently had tried YAZ  before but had some side effects from it that were unpleasant.  I have  told her the options of Depo-Provera, Provera cyclically for 10 days of  each month, or the oral contraceptives and we are increasing her  metformin to 500 t.i.d. with meals.  She is to come back in 2 weeks for  the biopsy report, and at that time, I will plan on starting her on oral  contraceptives if truly she has endometrial hyperplasia unless she opts  for one of the alternatives.  I have also wondered that when she goes on  metformin if she is not on oral contraceptives and if she becomes  sexually active, then  there is a risk of pregnancy.   IMPRESSION:  Polycystic ovarian syndrome with significantly thickened  endometrium.  Good sample for biopsy was easily obtained.           ______________________________  Argentina Donovan, MD     PR/MEDQ  D:  03/09/2008  T:  03/10/2008  Job:  [id number]

## 2010-10-09 NOTE — Group Therapy Note (Signed)
[name], [name]            ACCOUNT NO.:  [id number]   MEDICAL RECORD NO.:  [id number]          PATIENT TYPE:  WOC   LOCATION:  [facility]                   FACILITY:  [facility]   PHYSICIAN:  Argentina Donovan, MD        DATE OF BIRTH:  11-01-1980   DATE OF SERVICE:  03/23/2008                                  CLINIC NOTE   REASON FOR VISIT:  Follow up results of endometrial biopsy.   HISTORY OF PRESENT ILLNESS:  Ms. Donna Burns is a 30 year old G0  female who is seen in consultation on March 09, 2008 for irregular and  heavy periods.  She had been referred by [facility] and had previously  diagnosed with polycystic ovarian syndrome and placed on  hydrochlorothiazide and Metformin.  Prior to that visit on the 14th, she  had a vaginal ultrasound which showed a 16-mm endometrial stripe.  It  was decided that the patient should have a biopsy to insure there was  only simple hyperplasia going on and no evidence of malignancy.  Results  have come back of that biopsy, which showed simple hyperplasia without  atypia and squamous metaplasia.   At that visit on the 14th, Dr. Okey Dupre discussed with the patient options  for her irregular bleeding being Depo-Provera 10 days monthly or going  on oral contraceptive pills.  Today she states that she has decided to  restart Yaz, as she had been on this in the past.  These results were  shared with her, and a prescription was given for Yaz.  She has been  instructed to follow up PCOS and hypertension with her primary medical  doctor and to follow up with [place] as needed if the [place]ianah Field is not working or  if she is not tolerating it.     ______________________________  Ardeen Garland, MD    ______________________________  Argentina Donovan, MD    LM/MEDQ  D:  03/23/2008  T:  03/23/2008  Job:  [id number]

## 2011-05-20 ENCOUNTER — Encounter: Payer: Self-pay | Admitting: *Deleted

## 2011-05-20 ENCOUNTER — Emergency Department (INDEPENDENT_AMBULATORY_CARE_PROVIDER_SITE_OTHER)
Admission: EM | Admit: 2011-05-20 | Discharge: 2011-05-20 | Disposition: A | Discharge status: Home / Self Care | Payer: BC Managed Care – PPO | Source: Home / Self Care | Attending: Emergency Medicine | Admitting: Emergency Medicine

## 2011-05-20 DIAGNOSIS — J40 Bronchitis, not specified as acute or chronic: Secondary | ICD-10-CM

## 2011-05-20 HISTORY — DX: Polycystic ovarian syndrome: E28.2

## 2011-05-20 MED ORDER — PREDNISONE 20 MG PO TABS: 40.0000 mg | ORAL_TABLET | Freq: Every day | ORAL | Status: AC

## 2011-05-20 NOTE — ED Provider Notes (Addendum)
History     CSN: [id number]  Arrival date & time 05/20/11  1703   First MD Initiated Contact with Patient 05/20/11 1710      Chief Complaint  Patient presents with  . Cough    (Consider location/radiation/quality/duration/timing/severity/associated sxs/prior treatment) HPI Comments: Cough and congested, had some fevers, last week now resolved but have had tightness in my chest like cough, NO FURTHER FEVERS, HURTS WHEN i COUGH  Patient is a 30 y.o. female presenting with cough. The history is provided by the patient.  Cough This is a new problem. The current episode started more than 1 week ago. The problem has not changed since onset.The cough is non-productive. Associated symptoms include rhinorrhea and shortness of breath. Pertinent negatives include no chest pain, no chills and no wheezing. She has tried decongestants for the symptoms. The treatment provided no relief. She is not a smoker. Her past medical history does not include COPD, emphysema or asthma.    Past Medical History  Diagnosis Date  . Diabetes mellitus   . Bilateral polycystic ovarian syndrome     History reviewed. No pertinent past surgical history.  Family History  Problem Relation Age of Onset  . Heart failure Mother   . Diabetes Mother   . Hypertension Mother     History  Substance Use Topics  . Smoking status: Never Smoker   . Smokeless tobacco: Not on file  . Alcohol Use: No    OB History    Grav Para Term Preterm Abortions TAB SAB Ect Mult Living                  Review of Systems  Constitutional: Negative for chills.  HENT: Positive for rhinorrhea.   Respiratory: Positive for cough and shortness of breath. Negative for wheezing.   Cardiovascular: Negative for chest pain.    Allergies  Review of patient's allergies indicates no known allergies.  Home Medications   Current Outpatient Rx  Name Route Sig Dispense Refill  . METFORMIN HCL ER 750 MG PO TB24 Oral Take 750 mg by mouth  daily with breakfast.      . PREDNISONE 20 MG PO TABS Oral Take 2 tablets (40 mg total) by mouth daily. 10 tablet 0    BP 153/95  Pulse 115  Temp(Src) 99.6 F (37.6 C) (Oral)  Resp 16  SpO2 98%  LMP 02/25/2011  Physical Exam  Nursing note and vitals reviewed. Constitutional: She appears well-developed and well-nourished. No distress.  HENT:  Head: Normocephalic.  Eyes: Conjunctivae are normal.  Neck: Normal range of motion. Neck supple.  Cardiovascular: Normal rate.   Pulmonary/Chest: Effort normal and breath sounds normal. No respiratory distress. She has no decreased breath sounds. She has no wheezes. She has no rales. She exhibits no tenderness.  Abdominal: Soft.    ED Course  Procedures (including critical care time)  Labs Reviewed - No data to display No results found.   1. Bronchitis       MDM  Resolving- ILI with residual cough afebrile        Jimmie Molly, MD 05/20/11 1752  Jimmie Molly, MD 05/20/11 1753

## 2011-05-20 NOTE — ED Notes (Signed)
Pt  Has  Symptoms  Of  Cough  /  Congestion     Since  Last  Week  The     Symptoms  Are  unreleived  By  otc  meds     She  Is  In no  Severe  Distress  She  Is  Talking in  Complete  sentances

## 2012-08-17 DIAGNOSIS — B009 Herpesviral infection, unspecified: Secondary | ICD-10-CM | POA: Insufficient documentation

## 2012-09-09 ENCOUNTER — Emergency Department (HOSPITAL_COMMUNITY)
Admission: EM | Admit: 2012-09-09 | Discharge: 2012-09-10 | Disposition: A | Discharge status: Home / Self Care | Payer: BC Managed Care – PPO | Attending: Emergency Medicine | Admitting: Emergency Medicine

## 2012-09-09 ENCOUNTER — Encounter (HOSPITAL_COMMUNITY): Payer: Self-pay | Admitting: Emergency Medicine

## 2012-09-09 DIAGNOSIS — Z79899 Other long term (current) drug therapy: Secondary | ICD-10-CM | POA: Insufficient documentation

## 2012-09-09 DIAGNOSIS — Z8742 Personal history of other diseases of the female genital tract: Secondary | ICD-10-CM | POA: Insufficient documentation

## 2012-09-09 DIAGNOSIS — I10 Essential (primary) hypertension: Secondary | ICD-10-CM

## 2012-09-09 DIAGNOSIS — E669 Obesity, unspecified: Secondary | ICD-10-CM | POA: Insufficient documentation

## 2012-09-09 DIAGNOSIS — M542 Cervicalgia: Secondary | ICD-10-CM | POA: Insufficient documentation

## 2012-09-09 DIAGNOSIS — R209 Unspecified disturbances of skin sensation: Secondary | ICD-10-CM | POA: Insufficient documentation

## 2012-09-09 DIAGNOSIS — R42 Dizziness and giddiness: Secondary | ICD-10-CM | POA: Insufficient documentation

## 2012-09-09 LAB — CBC
HCT: 35.7 % — ABNORMAL LOW (ref 36.0–46.0)
MCV: 88.4 fL (ref 78.0–100.0)
Platelets: 170 10*3/uL (ref 150–400)
RBC: 4.04 MIL/uL (ref 3.87–5.11)
WBC: 6.5 10*3/uL (ref 4.0–10.5)

## 2012-09-09 MED ORDER — LISINOPRIL 10 MG PO TABS
10.0000 mg | ORAL_TABLET | Freq: Once | ORAL | Status: DC
  Filled 2012-09-09: qty 1

## 2012-09-09 NOTE — ED Notes (Signed)
EKG old and new given to EDP, Fonnie Jarvis, MD.

## 2012-09-09 NOTE — ED Provider Notes (Signed)
History     CSN: [id number]  Arrival date & time 09/09/12  2235   First MD Initiated Contact with Patient 09/09/12 2301      Chief Complaint  Patient presents with  . Hypertension    (Consider location/radiation/quality/duration/timing/severity/associated sxs/prior treatment) HPI Comments: 32 y/o female with past medical history of hypertension and polycystic ovarian syndrome presents to the emergency department with concerns of high blood pressure. Patient states for the past month her blood pressure has been elevated as she checks it periodically. She went to the dentist a few days back to have a tooth pulled and was told it was elevated in the 170s over 100s. Tonight she went to the grocery store and checked her blood pressure and was 184/117. Upon arriving to the ED her blood pressure was 150/106. A couple years back she took lisinopril, however she was taken off of it because she had lost weight at the time and blood pressure was controlled. One week ago she stopped taking the phentermine which she had been taking for the past month for weight loss. Admits to feeling lightheaded at times and today developed a sharp "tinge" with tingling on the right side of her neck, worse with movements. Denies chest pain, shortness of breath, nausea, vomiting, visual disturbance, headaches or extremity edema.  Patient is a 32 y.o. female presenting with hypertension. The history is provided by the patient.  Hypertension Associated symptoms include neck pain. Pertinent negatives include no abdominal pain, chest pain, chills, coughing, fever, headaches, nausea or vomiting.    Past Medical History  Diagnosis Date  . Bilateral polycystic ovarian syndrome     Past Surgical History  Procedure Laterality Date  . Mouth surgery      Family History  Problem Relation Age of Onset  . Heart failure Mother   . Diabetes Mother   . Hypertension Mother     History  Substance Use Topics  . Smoking  status: Never Smoker   . Smokeless tobacco: Not on file  . Alcohol Use: No    OB History   Grav Para Term Preterm Abortions TAB SAB Ect Mult Living                  Review of Systems  Constitutional: Negative for fever and chills.  HENT: Positive for neck pain. Negative for neck stiffness.   Eyes: Negative for visual disturbance.  Respiratory: Negative for cough and shortness of breath.   Cardiovascular: Negative for chest pain, palpitations and leg swelling.  Gastrointestinal: Negative for nausea, vomiting and abdominal pain.  Musculoskeletal: Negative for back pain.  Neurological: Positive for light-headedness. Negative for dizziness and headaches.  All other systems reviewed and are negative.    Allergies  Review of patient's allergies indicates no known allergies.  Home Medications   Current Outpatient Rx  Name  Route  Sig  Dispense  Refill  . metFORMIN (GLUCOPHAGE-XR) 750 MG 24 hr tablet   Oral   Take 750 mg by mouth daily with breakfast.             BP 150/106  Pulse 79  Temp(Src) 98 F (36.7 C) (Oral)  Resp 18  SpO2 100%  LMP 08/10/2012  Physical Exam  Nursing note and vitals reviewed. Constitutional: She is oriented to person, place, and time. She appears well-developed. No distress.  Obese  HENT:  Head: Normocephalic and atraumatic.  Mouth/Throat: Oropharynx is clear and moist.  Eyes: Conjunctivae and EOM are normal. Pupils are equal, round,  and reactive to light.  Neck: Normal range of motion. Neck supple. No JVD present.    Cardiovascular: Normal rate, regular rhythm, normal heart sounds and intact distal pulses.   No extremity edema.  Pulmonary/Chest: Effort normal and breath sounds normal. No respiratory distress. She has no wheezes. She has no rales.  Abdominal: Soft. Bowel sounds are normal. There is no tenderness.  Musculoskeletal: Normal range of motion. She exhibits no edema.  Neurological: She is alert and oriented to person, place,  and time.  Skin: Skin is warm and dry. She is not diaphoretic.  Psychiatric: She has a normal mood and affect. Her behavior is normal.    ED Course  Procedures (including critical care time)  Labs Reviewed  CBC - Abnormal; Notable for the following:    HCT 35.7 (*)    All other components within normal limits  POCT I-STAT TROPONIN I   No results found.   Date: 09/09/2012  Rate: 65  Rhythm: normal sinus rhythm  QRS Axis: left  Intervals: normal  ST/T Wave abnormalities: normal  Conduction Disutrbances:none  Narrative Interpretation: no stemi  Old EKG Reviewed: changes noted LVH, J-point elevation in lead v1   1. Hypertension       MDM  32 y/o female with HTN. No CP, SOB, HA. EKG with LVH and j-point elevation in lead 1 which is different from EKG tracing from 06/2008. Obtaining cbc, istat chem 8, istat troponin. If all normal, will discharge with lisinopril and PCP follow up. She is in NAD. PE unremarkable. 1:04 AM Troponin negative. Labs unremarkable. Discharge with lisinopril. NAD. Return precautions discussed. She will f/u with her PCP Dr. Roseanne Reno this week. Patient states understanding of plan and is agreeable.        Trevor Mace, PA-C 09/10/12 [phone]

## 2012-09-09 NOTE — ED Notes (Signed)
Pt states she has been having an elevated blood pressure for about the past month  Pt states she has been checking it periodically  Pt states she went to the dentist the other day and it was elevated and she checked it tonight at the grocery store and the top number was 180  Pt states she took lisinopril a couple years ago but her dr took her off it because her blood pressure was fine and she has not been on any since

## 2012-09-10 LAB — POCT I-STAT TROPONIN I

## 2012-09-10 MED ORDER — LISINOPRIL 20 MG PO TABS: 10.0000 mg | ORAL_TABLET | Freq: Every day | ORAL | Status: DC

## 2012-09-10 NOTE — ED Provider Notes (Signed)
Medical screening examination/treatment/procedure(s) were performed by non-physician practitioner and as supervising physician I was immediately available for consultation/collaboration.   Aymee Fomby L Mahlani Berninger, MD 09/10/12 0350 

## 2012-09-10 NOTE — ED Notes (Signed)
Pt. I-stat chem 8 resulted in lab, but did not cross over.

## 2012-09-11 LAB — POCT I-STAT, CHEM 8
Creatinine, Ser: 1.2 mg/dL — ABNORMAL HIGH (ref 0.50–1.10)
Hemoglobin: 12.9 g/dL (ref 12.0–15.0)
Potassium: 3.7 mEq/L (ref 3.5–5.1)
Sodium: 141 mEq/L (ref 135–145)

## 2012-11-18 ENCOUNTER — Encounter (HOSPITAL_COMMUNITY): Payer: Self-pay

## 2012-11-18 ENCOUNTER — Emergency Department (HOSPITAL_COMMUNITY)
Admission: EM | Admit: 2012-11-18 | Discharge: 2012-11-18 | Disposition: A | Discharge status: Home / Self Care | Payer: BC Managed Care – PPO | Source: Home / Self Care | Attending: Family Medicine | Admitting: Family Medicine

## 2012-11-18 DIAGNOSIS — N760 Acute vaginitis: Secondary | ICD-10-CM

## 2012-11-18 HISTORY — DX: Essential (primary) hypertension: I10

## 2012-11-18 LAB — POCT URINALYSIS DIP (DEVICE)
Bilirubin Urine: NEGATIVE
Glucose, UA: NEGATIVE mg/dL
Ketones, ur: NEGATIVE mg/dL
Leukocytes, UA: NEGATIVE
pH: 6.5 (ref 5.0–8.0)

## 2012-11-18 LAB — POCT PREGNANCY, URINE: Preg Test, Ur: NEGATIVE

## 2012-11-18 MED ORDER — NYSTATIN-TRIAMCINOLONE 100000-0.1 UNIT/GM-% EX OINT: TOPICAL_OINTMENT | Freq: Two times a day (BID) | CUTANEOUS | Status: DC

## 2012-11-18 MED ORDER — VALACYCLOVIR HCL 500 MG PO TABS: 500.0000 mg | ORAL_TABLET | Freq: Two times a day (BID) | ORAL | Status: AC

## 2012-11-18 MED ORDER — HYDROCORTISONE ACE-PRAMOXINE 1-1 % RE CREA: TOPICAL_CREAM | Freq: Two times a day (BID) | RECTAL | Status: DC

## 2012-11-18 NOTE — ED Notes (Signed)
Patient w history of PCOD, herpes, states she had begun to have irritation of vaginal area prior to starting her menses , and after her menses (6-15 thru 6-22) noted continued irritation. Using Acyclovir w/o relef. Has  Been using latex condoms recently and is concerned for this being poss cause of issues. NAD . Husband has recently been checked for problems, and he is reportedly "clean"; denies vaginal d/c or odor

## 2012-11-18 NOTE — ED Provider Notes (Signed)
History    CSN: [id number] Arrival date & time 11/18/12  1612  First MD Initiated Contact with Patient 11/18/12 1731     Chief Complaint  Patient presents with  . Vaginal Itching   (Consider location/radiation/quality/duration/timing/severity/associated sxs/prior Treatment) HPI Comments: 32 year old female with history of genital herpes. Here complaining of vaginal burning, itchiness and pain after she finished her menstrual period about 4 days ago. Patient reports she took acyclovir during her menstrual period to avoid an outbreak despite no having symptoms at the time. She is is not currently taking this medication. Started to use latex condoms recently. Husband with no genital symptoms. Declines pelvic exam stating she and husband have been checked recently "for everything".  Past Medical History  Diagnosis Date  . Bilateral polycystic ovarian syndrome   . Hypertension    Past Surgical History  Procedure Laterality Date  . Mouth surgery     Family History  Problem Relation Age of Onset  . Heart failure Mother   . Diabetes Mother   . Hypertension Mother    History  Substance Use Topics  . Smoking status: Never Smoker   . Smokeless tobacco: Not on file  . Alcohol Use: No   OB History   Grav Para Term Preterm Abortions TAB SAB Ect Mult Living                 Review of Systems  Constitutional: Negative for fever and chills.  Gastrointestinal: Negative for abdominal pain.  Genitourinary: Positive for genital sores. Negative for dysuria, vaginal bleeding, vaginal discharge and pelvic pain.  Musculoskeletal: Negative for back pain.  All other systems reviewed and are negative.    Allergies  Pork-derived products  Home Medications   Current Outpatient Rx  Name  Route  Sig  Dispense  Refill  . lisinopril (PRINIVIL,ZESTRIL) 20 MG tablet   Oral   Take 0.5 tablets (10 mg total) by mouth daily.   30 tablet   0   . acyclovir (ZOVIRAX) 800 MG tablet   Oral   Take  800 mg by mouth 2 (two) times daily.         Marland Kitchen HYDROcodone-acetaminophen (NORCO/VICODIN) 5-325 MG per tablet   Oral   Take 1 tablet by mouth every 6 (six) hours as needed for pain.         Marland Kitchen ibuprofen (ADVIL,MOTRIN) 800 MG tablet   Oral   Take 800 mg by mouth every 8 (eight) hours as needed for pain.         . metFORMIN (GLUCOPHAGE) 500 MG tablet   Oral   Take 500 mg by mouth 2 (two) times daily with a meal.         . naproxen sodium (ANAPROX) 220 MG tablet   Oral   Take 220 mg by mouth 2 (two) times daily as needed (for pain).         . nystatin-triamcinolone ointment (MYCOLOG)   Topical   Apply topically 2 (two) times daily.   30 g   0   . phentermine (ADIPEX-P) 37.5 MG tablet   Oral   Take 37.5 mg by mouth daily before breakfast.         . pramoxine-hydrocortisone (PROCTOCREAM-HC) 1-1 % rectal cream   Rectal   Place rectally 2 (two) times daily.   30 g   0   . valACYclovir (VALTREX) 500 MG tablet   Oral   Take 1 tablet (500 mg total) by mouth 2 (two) times daily.  6 tablet   1    BP 146/88  Pulse 74  Temp(Src) 98.4 F (36.9 C) (Oral)  Resp 16  SpO2 97% Physical Exam  Vitals reviewed. Constitutional: She is oriented to person, place, and time. She appears well-developed and well-nourished. No distress.  HENT:  Head: Normocephalic and atraumatic.  Mouth/Throat: Oropharynx is clear and moist.  Cardiovascular: Normal heart sounds.   Pulmonary/Chest: Breath sounds normal.  Abdominal: Soft. There is no tenderness.  Genitourinary:  There is a small ulceration with erythema and mid swelling of the mucosa at the lower border of vaginal introitus. As per patient request no speculum exam performed.   Neurological: She is alert and oriented to person, place, and time.  Skin: No rash noted. She is not diaphoretic.    ED Course  Procedures (including critical care time) Labs Reviewed  POCT URINALYSIS DIP (DEVICE)  POCT PREGNANCY, URINE   No  results found. 1. Vaginitis     MDM  Likely a milder herpes outbreak. Discussed with patient possibility of latex allergy or Candida related vaginitis. Patient declined STD testing as she has recently been tested. Prescribed Valtrex, pramoxine/hydrocortisone cream, nystatin triamcinolone ointment. Supportive care and red flags that should prompt return to medical attention discussed with patient and provided in writing.  Sharin Grave, MD 11/20/12 4540

## 2012-12-21 ENCOUNTER — Emergency Department (HOSPITAL_COMMUNITY)
Admission: EM | Admit: 2012-12-21 | Discharge: 2012-12-21 | Disposition: A | Discharge status: Home / Self Care | Payer: BC Managed Care – PPO | Attending: Emergency Medicine | Admitting: Emergency Medicine

## 2012-12-21 ENCOUNTER — Encounter (HOSPITAL_COMMUNITY): Payer: Self-pay | Admitting: Cardiology

## 2012-12-21 DIAGNOSIS — Z87828 Personal history of other (healed) physical injury and trauma: Secondary | ICD-10-CM | POA: Insufficient documentation

## 2012-12-21 DIAGNOSIS — M25569 Pain in unspecified knee: Secondary | ICD-10-CM | POA: Insufficient documentation

## 2012-12-21 DIAGNOSIS — Z79899 Other long term (current) drug therapy: Secondary | ICD-10-CM | POA: Insufficient documentation

## 2012-12-21 DIAGNOSIS — I10 Essential (primary) hypertension: Secondary | ICD-10-CM | POA: Insufficient documentation

## 2012-12-21 DIAGNOSIS — M25561 Pain in right knee: Secondary | ICD-10-CM

## 2012-12-21 MED ORDER — TRAMADOL HCL 50 MG PO TABS: 50.0000 mg | ORAL_TABLET | Freq: Four times a day (QID) | ORAL | Status: DC | PRN

## 2012-12-21 NOTE — ED Provider Notes (Signed)
CSN: [id number]     Arrival date & time 12/21/12  7829 History     First MD Initiated Contact with Patient 12/21/12 1022     Chief Complaint  Patient presents with  . Knee Pain   (Consider location/radiation/quality/duration/timing/severity/associated sxs/prior Treatment) The history is provided by the patient and medical records.   Patient presents to the ED for right knee pain for the past several days.  No recent injury or trauma. Patient states she has a remote history of right lateral meniscus injury. No prior surgical repair. Patient states she's been doing increased amounts of standing and walking at work which she thinks has exacerbated the pain. Pain worse with weightbearing and squatting to sit down. Denies any numbness or paresthesias of right leg or foot.  Has been taking over-the-counter Aleve without significant relief. Patient is not currently established with orthopedics.  Past Medical History  Diagnosis Date  . Bilateral polycystic ovarian syndrome   . Hypertension    Past Surgical History  Procedure Laterality Date  . Mouth surgery     Family History  Problem Relation Age of Onset  . Heart failure Mother   . Diabetes Mother   . Hypertension Mother    History  Substance Use Topics  . Smoking status: Never Smoker   . Smokeless tobacco: Not on file  . Alcohol Use: No   OB History   Grav Para Term Preterm Abortions TAB SAB Ect Mult Living                 Review of Systems  Musculoskeletal: Positive for arthralgias.  All other systems reviewed and are negative.    Allergies  Pork-derived products  Home Medications   Current Outpatient Rx  Name  Route  Sig  Dispense  Refill  . Ibuprofen (ADVIL PO)   Oral   Take 1 tablet by mouth every 8 (eight) hours as needed (For pain).         Marland Kitchen lisinopril (PRINIVIL,ZESTRIL) 20 MG tablet   Oral   Take 0.5 tablets (10 mg total) by mouth daily.   30 tablet   0   . metFORMIN (GLUCOPHAGE) 500 MG tablet  Oral   Take 500 mg by mouth daily at 12 noon.          . nystatin-triamcinolone ointment (MYCOLOG)   Topical   Apply 1 application topically 2 (two) times daily as needed (for antifungal).         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          . pramoxine-hydrocortisone (PROCTOCREAM-HC) 1-1 % rectal cream   Rectal   Place rectally 2 (two) times daily.   30 g   0    BP 145/97  Pulse 72  Temp(Src) 97.4 F (36.3 C) (Oral)  Resp 16  SpO2 99%  Physical Exam  Nursing note and vitals reviewed. Constitutional: She is oriented to person, place, and time. She appears well-developed and well-nourished. No distress.  HENT:  Head: Normocephalic and atraumatic.  Eyes: Conjunctivae and EOM are normal. Pupils are equal, round, and reactive to light.  Neck: Normal range of motion. Neck supple.  Cardiovascular: Normal rate, regular rhythm and normal heart sounds.   Pulmonary/Chest: Effort normal and breath sounds normal.  Musculoskeletal:       Right knee: She exhibits decreased range of motion. She exhibits no swelling, no effusion, no ecchymosis, no  deformity, no laceration, no erythema and normal alignment. Tenderness found. Lateral joint line tenderness noted.  Right knee with TTP along lateral joint line, flexion/extension limited with crepitus present, + McMurray test, strong distal pulse, sensation intact, gait appropriate  Neurological: She is alert and oriented to person, place, and time.  Skin: Skin is warm and dry.  Psychiatric: She has a normal mood and affect.    ED Course   Procedures (including critical care time)  Labs Reviewed - No data to display No results found.  1. Knee pain, acute, right     MDM   No recent injury or trauma, ambulating appropriate--x-ray deferred. Knee sleeve placed in the ED.  Rx tramadol. Followup with orthopedics, Dr. Ophelia Charter, for further evaluation of possible repeat/worsening  meniscal injury.  Discussed plan with pt, she agreed.  Return precautions advised.  Garlon Hatchet, PA-C 12/21/12 1514

## 2012-12-21 NOTE — ED Notes (Signed)
Pt reports right knee pain over the past couple of days. States increased pain with standing and movement. Denies any recent injury.

## 2012-12-21 NOTE — ED Provider Notes (Signed)
Medical screening examination/treatment/procedure(s) were performed by non-physician practitioner and as supervising physician I was immediately available for consultation/collaboration.   Susannah Carbin, MD 12/21/12 2144 

## 2013-02-03 ENCOUNTER — Emergency Department (INDEPENDENT_AMBULATORY_CARE_PROVIDER_SITE_OTHER): Payer: BC Managed Care – PPO

## 2013-02-03 ENCOUNTER — Emergency Department (HOSPITAL_COMMUNITY)
Admission: EM | Admit: 2013-02-03 | Discharge: 2013-02-03 | Disposition: A | Discharge status: Home / Self Care | Payer: BC Managed Care – PPO | Source: Home / Self Care | Attending: Family Medicine | Admitting: Family Medicine

## 2013-02-03 ENCOUNTER — Encounter (HOSPITAL_COMMUNITY): Payer: Self-pay | Admitting: *Deleted

## 2013-02-03 DIAGNOSIS — S9030XA Contusion of unspecified foot, initial encounter: Secondary | ICD-10-CM

## 2013-02-03 DIAGNOSIS — S9032XA Contusion of left foot, initial encounter: Secondary | ICD-10-CM

## 2013-02-03 IMAGING — CR DG FOOT COMPLETE 3+V*L*
3 series · 3 of 3 positions shown · non-contrast
Comparison: None.

CLINICAL DATA: Pain post trauma

LEFT FOOT - COMPLETE 3+ VIEW

[view not recorded (1 of 3)]
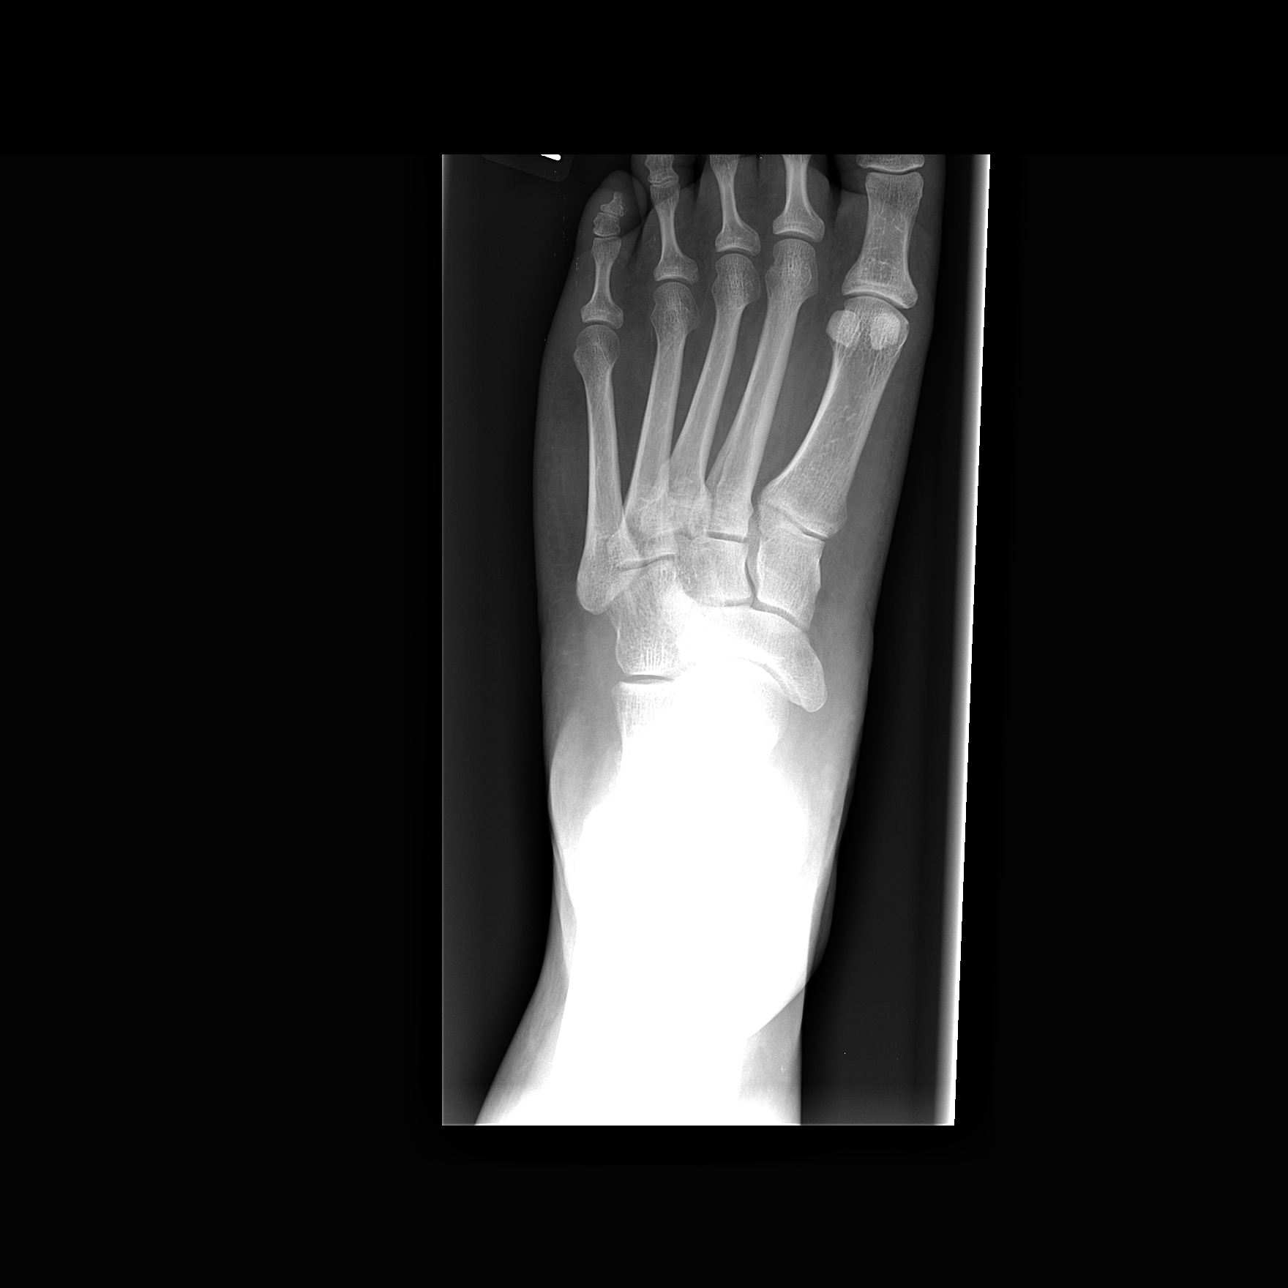

[view not recorded (2 of 3)]
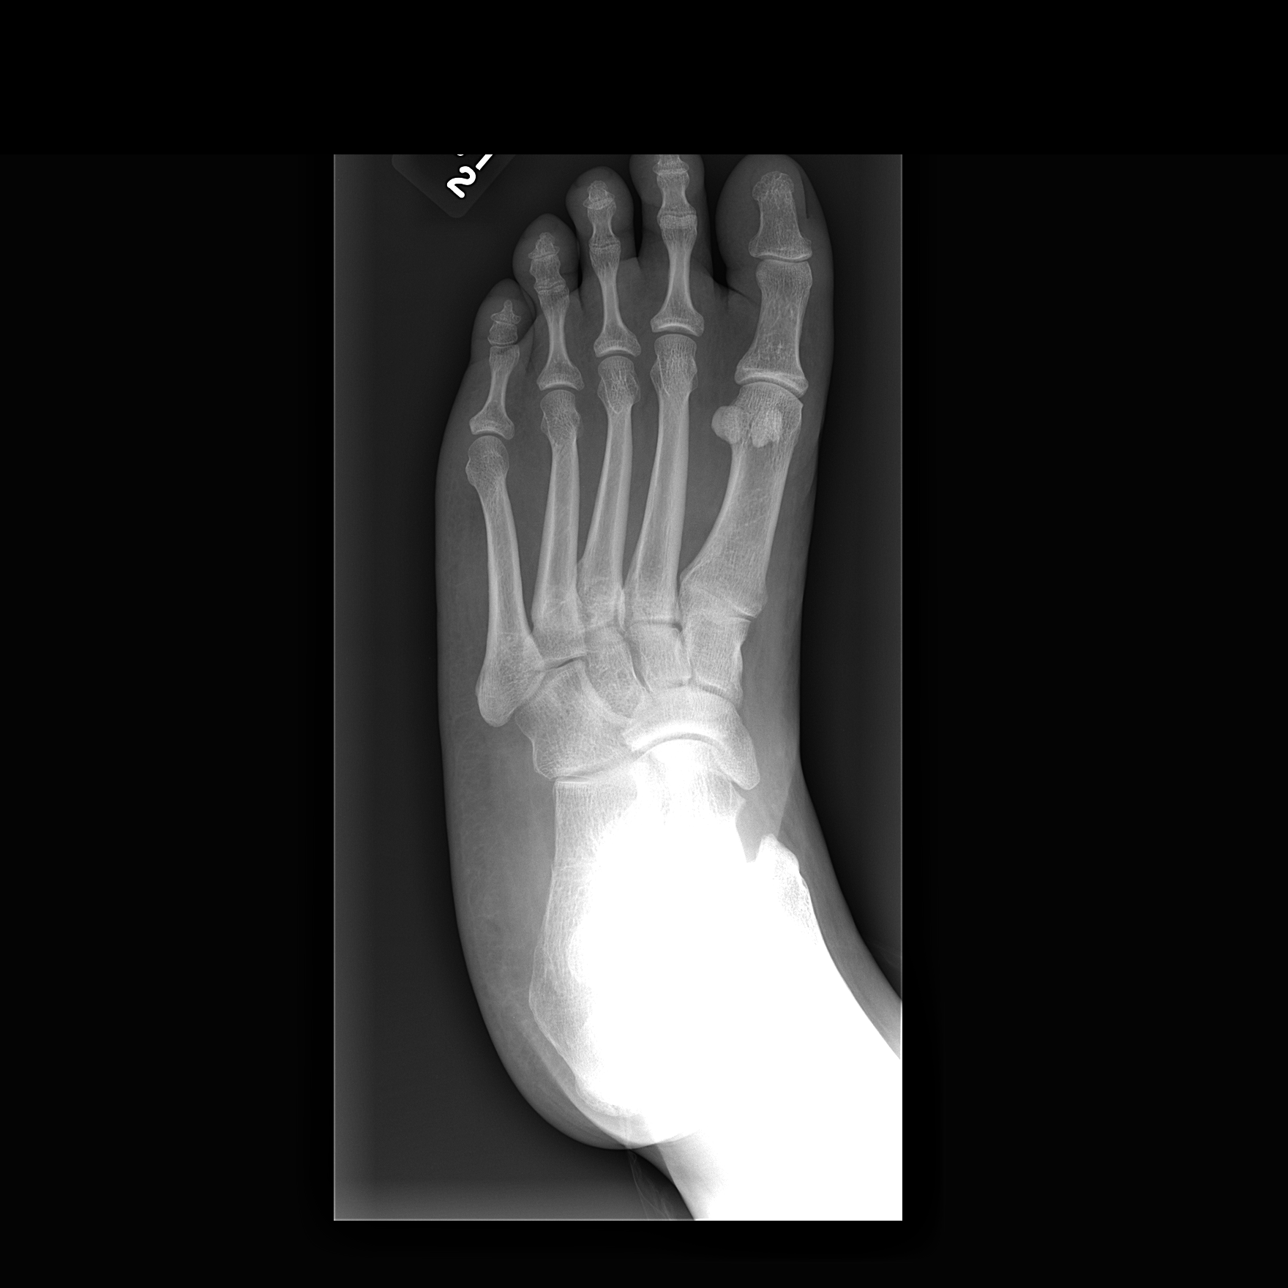

[view not recorded (3 of 3)]
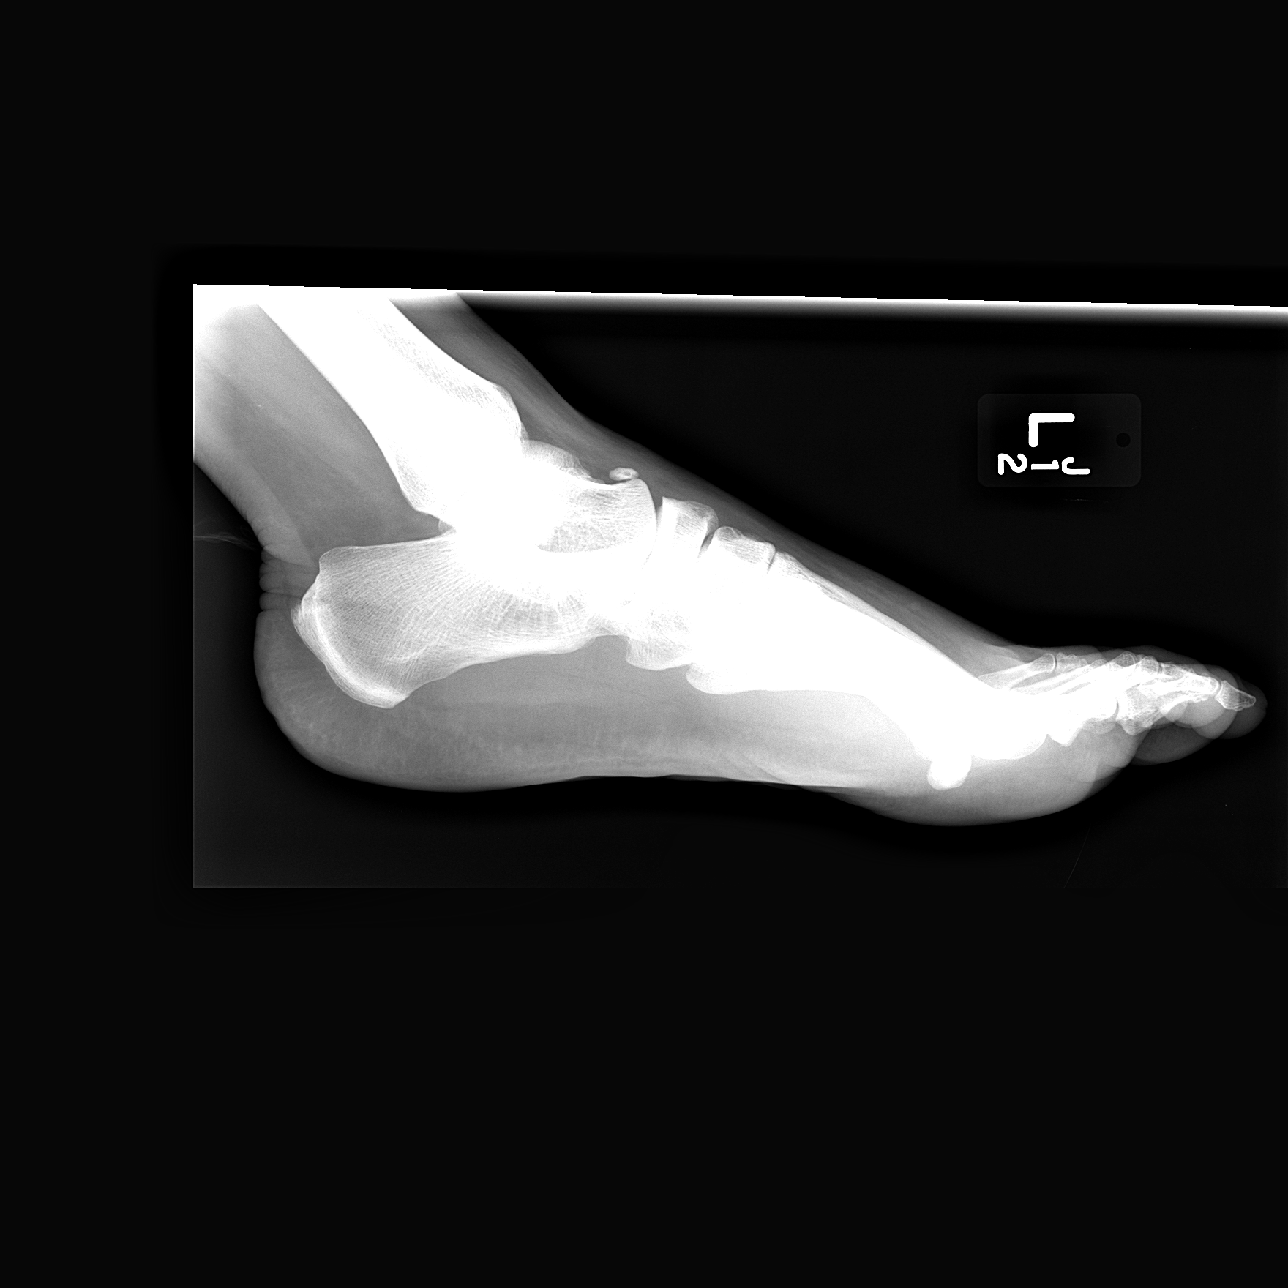

[3 of 3 positions shown; findings below may reference images not displayed]

FINDINGS: Frontal, oblique, and lateral views were obtained. There
is no acute fracture or dislocation.  Joint spaces appear intact.
No erosive change.  There is calcification dorsal to the distal
talus which could represent residua of old trauma.
IMPRESSION: Question residua of old trauma dorsal to the distal
talus.  No acute fracture or dislocation.

## 2013-02-03 MED ORDER — DICLOFENAC 35 MG PO CAPS: 35.0000 mg | ORAL_CAPSULE | Freq: Three times a day (TID) | ORAL | Status: DC

## 2013-02-03 NOTE — ED Notes (Signed)
Pt  Reports  She  Dropped a  Heavy  Object on l  Foot  sev  Days  Ago  She  Has  Some  Swelling as  Well as  Pain on  Weight  Bearing

## 2013-02-03 NOTE — ED Provider Notes (Signed)
CSN: [id number]     Arrival date & time 02/03/13  1441 History   First MD Initiated Contact with Patient 02/03/13 1551     Chief Complaint  Patient presents with  . Foot Pain   (Consider location/radiation/quality/duration/timing/severity/associated sxs/prior Treatment) Patient is a 32 y.o. female presenting with lower extremity pain. The history is provided by the patient and the spouse.  Foot Pain This is a new problem. The current episode started more than 2 days ago (dropped heavy box on left foot, still sore and with sts., toes intact.). The problem has not changed since onset.The symptoms are aggravated by walking.    Past Medical History  Diagnosis Date  . Bilateral polycystic ovarian syndrome   . Hypertension    Past Surgical History  Procedure Laterality Date  . Mouth surgery     Family History  Problem Relation Age of Onset  . Heart failure Mother   . Diabetes Mother   . Hypertension Mother    History  Substance Use Topics  . Smoking status: Never Smoker   . Smokeless tobacco: Not on file  . Alcohol Use: No   OB History   Grav Para Term Preterm Abortions TAB SAB Ect Mult Living                 Review of Systems  Constitutional: Negative.   Musculoskeletal: Positive for joint swelling. Negative for gait problem.  Skin: Negative.     Allergies  Pork-derived products  Home Medications   Current Outpatient Rx  Name  Route  Sig  Dispense  Refill  . 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          . Diclofenac (ZORVOLEX) 35 MG CAPS   Oral   Take 35 mg by mouth 3 (three) times daily after meals.   90 capsule   0   . Ibuprofen (ADVIL PO)   Oral   Take 1 tablet by mouth every 8 (eight) hours as needed (For pain).         Marland Kitchen lisinopril (PRINIVIL,ZESTRIL) 20 MG tablet   Oral   Take 0.5 tablets (10 mg total) by mouth daily.   30 tablet   0   . metFORMIN (GLUCOPHAGE) 500 MG tablet   Oral  Take 500 mg by mouth daily at 12 noon.          . nystatin-triamcinolone ointment (MYCOLOG)   Topical   Apply 1 application topically 2 (two) times daily as needed (for antifungal).         . pramoxine-hydrocortisone (PROCTOCREAM-HC) 1-1 % rectal cream   Rectal   Place rectally 2 (two) times daily.   30 g   0   . traMADol (ULTRAM) 50 MG tablet   Oral   Take 1 tablet (50 mg total) by mouth every 6 (six) hours as needed for pain.   15 tablet   0    BP 146/109  Pulse 75  Temp(Src) 98.4 F (36.9 C) (Oral)  Resp 18  SpO2 98% Physical Exam  Nursing note and vitals reviewed. Constitutional: She is oriented to person, place, and time. She appears well-developed and well-nourished.  Musculoskeletal: She exhibits tenderness.       Feet:  Neurological: She is alert and oriented to person, place, and time.  Skin: Skin is warm and dry. No erythema.    ED Course  Procedures (including critical care time) Labs  Review Labs Reviewed - No data to display Imaging Review Dg Foot Complete Left  02/03/2013   *RADIOLOGY REPORT*  Clinical Data: Pain post trauma  LEFT FOOT - COMPLETE 3+ VIEW  Comparison: None.  Findings: Frontal, oblique, and lateral views were obtained. There is no acute fracture or dislocation.  Joint spaces appear intact. No erosive change.  There is calcification dorsal to the distal talus which could represent residua of old trauma.  IMPRESSION:   Question residua of old trauma dorsal to the distal talus.  No acute fracture or dislocation.   Original Report Authenticated By: Bretta Bang, M.D.    MDM  X-rays reviewed and report per radiologist.     Linna Hoff, MD 02/03/13 1655

## 2013-02-26 ENCOUNTER — Ambulatory Visit: Payer: BC Managed Care – PPO

## 2013-05-24 ENCOUNTER — Ambulatory Visit: Payer: BC Managed Care – PPO | Admitting: Internal Medicine

## 2013-05-25 ENCOUNTER — Ambulatory Visit: Payer: BC Managed Care – PPO | Admitting: Internal Medicine

## 2013-05-27 HISTORY — PX: KNEE SURGERY: SHX244

## 2013-06-07 ENCOUNTER — Ambulatory Visit: Payer: BC Managed Care – PPO | Admitting: Internal Medicine

## 2013-11-19 ENCOUNTER — Emergency Department (INDEPENDENT_AMBULATORY_CARE_PROVIDER_SITE_OTHER)
Admission: EM | Admit: 2013-11-19 | Discharge: 2013-11-19 | Disposition: A | Discharge status: Home / Self Care | Payer: BC Managed Care – PPO | Source: Home / Self Care | Attending: Emergency Medicine | Admitting: Emergency Medicine

## 2013-11-19 ENCOUNTER — Emergency Department (HOSPITAL_COMMUNITY)
Admission: EM | Admit: 2013-11-19 | Discharge: 2013-11-19 | Discharge status: Absent without leave | Payer: BC Managed Care – PPO | Source: Home / Self Care | Attending: Family Medicine | Admitting: Family Medicine

## 2013-11-19 ENCOUNTER — Other Ambulatory Visit (HOSPITAL_COMMUNITY)
Admission: RE | Admit: 2013-11-19 | Discharge: 2013-11-19 | Disposition: A | Discharge status: Home / Self Care | Payer: BC Managed Care – PPO | Source: Ambulatory Visit | Attending: Emergency Medicine | Admitting: Emergency Medicine

## 2013-11-19 ENCOUNTER — Encounter (HOSPITAL_COMMUNITY): Payer: Self-pay | Admitting: Emergency Medicine

## 2013-11-19 DIAGNOSIS — Z113 Encounter for screening for infections with a predominantly sexual mode of transmission: Secondary | ICD-10-CM | POA: Insufficient documentation

## 2013-11-19 DIAGNOSIS — R1031 Right lower quadrant pain: Secondary | ICD-10-CM

## 2013-11-19 DIAGNOSIS — R109 Unspecified abdominal pain: Secondary | ICD-10-CM

## 2013-11-19 DIAGNOSIS — N760 Acute vaginitis: Secondary | ICD-10-CM | POA: Insufficient documentation

## 2013-11-19 DIAGNOSIS — R1032 Left lower quadrant pain: Principal | ICD-10-CM

## 2013-11-19 LAB — POCT URINALYSIS DIP (DEVICE)
Bilirubin Urine: NEGATIVE
Glucose, UA: NEGATIVE mg/dL
HGB URINE DIPSTICK: NEGATIVE
Ketones, ur: NEGATIVE mg/dL
Leukocytes, UA: NEGATIVE
Nitrite: NEGATIVE
PROTEIN: NEGATIVE mg/dL
UROBILINOGEN UA: 0.2 mg/dL (ref 0.0–1.0)
pH: 5.5 (ref 5.0–8.0)

## 2013-11-19 LAB — POCT PREGNANCY, URINE: Preg Test, Ur: NEGATIVE

## 2013-11-19 NOTE — ED Notes (Signed)
Pt preferred a female provider; adv that at 1700 we will have a female provider Pt will return then.

## 2013-11-19 NOTE — ED Notes (Signed)
Here from [facility] [place]

## 2013-11-19 NOTE — ED Notes (Signed)
C/o RLQ abdominal pain x 3 days, LMP spotty

## 2013-11-19 NOTE — ED Notes (Signed)
Call home number (mobile ) for lab issues or use : [phone] work number

## 2013-11-19 NOTE — ED Provider Notes (Signed)
CSN: [id number]     Arrival date & time 11/19/13  1655 History   First MD Initiated Contact with Patient 11/19/13 1809     Chief Complaint  Patient presents with  . Abdominal Pain   (Consider location/radiation/quality/duration/timing/severity/associated sxs/prior Treatment) HPI Comments: Patient reports that she has PCOS and as a result has irregular menses. States she began having her menstrual cycle while at work 9 days ago and had to borrow a tampon from a coworker. Reports that she had never used a tampon before. States she placed tampon and then [place] and could not recall if she had removed tampon. Became concerned that tampon was stuck or lost in her vaginal vault and sought evaluation at ER in [place]. Was informed her exam was normal and that she did not have a retained FB. States that she has worried about this much of the week and presents for repeat pelvic exam as confirmation that "everything is ok." States she has had mild discomfort at bilateral inguinal areas and is worried that tampon may have "given me an infection."  Denies vaginal bleeding, vaginal discharge, urinary symptoms, fever.   The history is provided by the patient.    Past Medical History  Diagnosis Date  . Bilateral polycystic ovarian syndrome   . Hypertension    Past Surgical History  Procedure Laterality Date  . Mouth surgery     Family History  Problem Relation Age of Onset  . Heart failure Mother   . Diabetes Mother   . Hypertension Mother    History  Substance Use Topics  . Smoking status: Never Smoker   . Smokeless tobacco: Not on file  . Alcohol Use: No   OB History   Grav Para Term Preterm Abortions TAB SAB Ect Mult Living                 Review of Systems  Constitutional: Negative.   HENT: Negative.   Eyes: Negative.   Respiratory: Negative.   Cardiovascular: Negative.   Gastrointestinal: Negative.   Endocrine: Negative for  polydipsia, polyphagia and polyuria.  Genitourinary: Positive for pelvic pain. Negative for dysuria, urgency, frequency, hematuria, flank pain, decreased urine volume, vaginal bleeding, vaginal discharge, enuresis, difficulty urinating, genital sores, vaginal pain and menstrual problem.  Musculoskeletal: Negative for back pain.  Skin: Negative.   Allergic/Immunologic: Negative for immunocompromised state.  Neurological: Negative.   Psychiatric/Behavioral: Negative.     Allergies  Pork-derived products  Home Medications   Prior to Admission medications   Medication Sig Start Date End Date Taking? 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     Historical Provider, MD  Diclofenac (ZORVOLEX) 35 MG CAPS Take 35 mg by mouth 3 (three) times daily after meals. 02/03/13   Linna HoffJames D Kindl, MD  Ibuprofen (ADVIL PO) Take 1 tablet by mouth every 8 (eight) hours as needed (For pain).    Historical Provider, MD  lisinopril (PRINIVIL,ZESTRIL) 20 MG tablet Take 0.5 tablets (10 mg total) by mouth daily. 09/10/12   Trevor Maceobyn M Albert, PA-C  metFORMIN (GLUCOPHAGE) 500 MG tablet Take 500 mg by mouth daily at 12 noon.     Historical Provider, MD  nystatin-triamcinolone ointment (MYCOLOG) Apply 1 application topically 2 (two) times daily as needed (for antifungal).    Historical Provider, MD  pramoxine-hydrocortisone (PROCTOCREAM-HC) 1-1 % rectal cream Place rectally 2 (two) times daily. 11/18/12  Adlih Moreno-Coll, MD  traMADol (ULTRAM) 50 MG tablet Take 1 tablet (50 mg total) by mouth every 6 (six) hours as needed for pain. 12/21/12   Garlon HatchetLisa M Sanders, PA-C   BP 144/88  Pulse 81  Temp(Src) 98.6 F (37 C) (Oral)  Resp 12  SpO2 99% Physical Exam  Nursing note and vitals reviewed. Constitutional: She is oriented to person, place, and time. She appears well-developed and well-nourished. No distress.  +obese  HENT:  Head: Normocephalic and atraumatic.   Eyes: Conjunctivae are normal. No scleral icterus.  Neck: Normal range of motion. Neck supple.  Cardiovascular: Normal rate, regular rhythm and normal heart sounds.   Pulmonary/Chest: Effort normal and breath sounds normal. No respiratory distress. She has no wheezes.  Abdominal: Soft. Bowel sounds are normal. She exhibits no distension and no mass. There is no tenderness. There is no rebound and no guarding. Hernia confirmed negative in the right inguinal area and confirmed negative in the left inguinal area.  +exam somewhat limited by obesity  Genitourinary: Uterus normal. Pelvic exam was performed with patient supine. There is no rash, tenderness, lesion or injury on the right labia. There is no rash, tenderness, lesion or injury on the left labia. Cervix exhibits no motion tenderness, no discharge and no friability. Right adnexum displays no mass, no tenderness and no fullness. Left adnexum displays no mass, no tenderness and no fullness. No erythema, tenderness or bleeding around the vagina. No foreign body around the vagina. No signs of injury around the vagina. No vaginal discharge found.  +mild discomfort with palpation along bilateral inguinal folds without evidence of hernia or lymphadenopathy No visible retained foreign body in vaginal vault  Musculoskeletal: Normal range of motion.  Lymphadenopathy:       Right: No inguinal adenopathy present.       Left: No inguinal adenopathy present.  Neurological: She is alert and oriented to person, place, and time.  Skin: Skin is warm and dry. No rash noted. No erythema.  Psychiatric: She has a normal mood and affect. Her behavior is normal.    ED Course  Procedures (including critical care time) Labs Review Labs Reviewed  POCT URINALYSIS DIP (DEVICE)  POCT PREGNANCY, URINE  CERVICOVAGINAL ANCILLARY ONLY    Imaging Review No results found.   MDM   1. Inguinal pain of both sides   Reassured patient that no evidence of retained  tampon.  UA normal. UPT negative No suggestion of incarcerated inguinal hernia or PID based upon exam.  Advised her that if results from vaginal swabs indicate the need for treatment, she will be notified by phone.  Advised use of tylenol or ibuprofen as directed on packaging for discomfort. Explained that is discomfort becomes severe or persistent or associated with fever. N/V, discharge or bleeding, she should be re-evaluated at her nearest ER.   Jess BartersJennifer Lee [place], [place] 11/19/13 2137

## 2013-11-19 NOTE — Discharge Instructions (Signed)
Your pregnancy test was negative. Your urine studies were normal. If your symptoms do not begin to improve over the next few days, I would suggest you [facility]. If symptoms become suddenly worse, persistent or severe, please seek re-evaluation in your nearest Emergency Room. If your vaginal swab results indicate your require additional treatment, you will be notified by phone.  For discomfort, please use either tylenol or ibuprofen as directed on packaging. If symptoms progress beyond what these medications will control, please seek re-evaluation.   Pelvic Pain, Female Female pelvic pain can be caused by many different things and start from a variety of places. Pelvic pain refers to pain that is located in the lower half of the abdomen and between your hips. The pain may occur over a short period of time (acute) or may be reoccurring (chronic). The cause of pelvic pain may be related to disorders affecting the female reproductive organs (gynecologic), but it may also be related to the bladder, kidney stones, an intestinal complication, or muscle or skeletal problems. Getting help right away for pelvic pain is important, especially if there has been severe, sharp, or a sudden onset of unusual pain. It is also important to get help right away because some types of pelvic pain can be life threatening.  CAUSES  Below are only some of the causes of pelvic pain. The causes of pelvic pain can be in one of several categories.   Gynecologic.  Pelvic inflammatory disease.  Sexually transmitted infection.  Ovarian cyst or a twisted ovarian ligament (ovarian torsion).  Uterine lining that grows outside the uterus (endometriosis).  Fibroids, cysts, or tumors.  Ovulation.  Pregnancy.  Pregnancy that occurs outside the uterus (ectopic pregnancy).  Miscarriage.  Labor.  Abruption of the placenta or ruptured uterus.  Infection.  Uterine infection (endometritis).  Bladder  infection.  Diverticulitis.  Miscarriage related to a uterine infection (septic abortion).  Bladder.  Inflammation of the bladder (cystitis).  Kidney stone(s).  Gastrointenstinal.  Constipation.  Diverticulitis.  Neurologic.  Trauma.  Feeling pelvic pain because of mental or emotional causes (psychosomatic).  Cancers of the bowel or pelvis. EVALUATION  Your caregiver will want to take a careful history of your concerns. This includes recent changes in your health, a careful gynecologic history of your periods (menses), and a sexual history. Obtaining your family history and medical history is also important. Your caregiver may suggest a pelvic exam. A pelvic exam will help identify the location and severity of the pain. It also helps in the evaluation of which organ system may be involved. In order to identify the cause of the pelvic pain and be properly treated, your caregiver may order tests. These tests may include:   A pregnancy test.  Pelvic ultrasonography.  An X-ray exam of the abdomen.  A urinalysis or evaluation of vaginal discharge.  Blood tests. HOME CARE INSTRUCTIONS   Only take over-the-counter or prescription medicines for pain, discomfort, or fever as directed by your caregiver.   Rest as directed by your caregiver.   Eat a balanced diet.   Drink enough fluids to make your urine clear or pale yellow, or as directed.   Avoid sexual intercourse if it causes pain.   Apply warm or cold compresses to the lower abdomen depending on which one helps the pain.   Avoid stressful situations.   Keep a journal of your pelvic pain. Write down when it started, where the pain is located, and if there are  things that seem to be associated with the pain, such as food or your menstrual cycle.  Follow up with your caregiver as directed.  SEEK MEDICAL CARE IF:  Your medicine does not help your pain.  You have abnormal vaginal discharge. SEEK IMMEDIATE  MEDICAL CARE IF:   You have heavy bleeding from the vagina.   Your pelvic pain increases.   You feel lightheaded or faint.   You have chills.   You have pain with urination or blood in your urine.   You have uncontrolled diarrhea or vomiting.   You have a fever or persistent symptoms for more than 3 days.  You have a fever and your symptoms suddenly get worse.   You are being physically or sexually abused.  MAKE SURE YOU:  Understand these instructions.  Will watch your condition.  Will get help if you are not doing well or get worse. Document Released: 04/09/2004 Document Revised: 11/12/2011 Document Reviewed: 09/02/2011 [facility] Patient Information 2015 [employer] [place]. This information is not intended to replace advice given to you by your health care provider. Make sure you discuss any questions you have with your health care provider.

## 2013-11-20 NOTE — ED Provider Notes (Signed)
Medical screening examination/treatment/procedure(s) were performed by non-physician practitioner and as supervising physician I was immediately available for consultation/collaboration.  Leslee Homeavid Niobe Dick, M.D.  Reuben Likesavid C Verlaine Embry, MD 11/20/13 [phone]

## 2014-05-27 HISTORY — PX: MOUTH SURGERY: SHX715

## 2015-02-25 ENCOUNTER — Emergency Department (HOSPITAL_COMMUNITY)
Admission: EM | Admit: 2015-02-25 | Discharge: 2015-02-25 | Disposition: A | Discharge status: Home / Self Care | Payer: Self-pay | Attending: Emergency Medicine | Admitting: Emergency Medicine

## 2015-02-25 ENCOUNTER — Encounter (HOSPITAL_COMMUNITY): Payer: Self-pay | Admitting: Vascular Surgery

## 2015-02-25 ENCOUNTER — Emergency Department (HOSPITAL_COMMUNITY): Payer: Self-pay

## 2015-02-25 DIAGNOSIS — Z79899 Other long term (current) drug therapy: Secondary | ICD-10-CM | POA: Insufficient documentation

## 2015-02-25 DIAGNOSIS — W1839XA Other fall on same level, initial encounter: Secondary | ICD-10-CM | POA: Insufficient documentation

## 2015-02-25 DIAGNOSIS — Y9389 Activity, other specified: Secondary | ICD-10-CM | POA: Insufficient documentation

## 2015-02-25 DIAGNOSIS — L309 Dermatitis, unspecified: Secondary | ICD-10-CM | POA: Insufficient documentation

## 2015-02-25 DIAGNOSIS — I10 Essential (primary) hypertension: Secondary | ICD-10-CM | POA: Insufficient documentation

## 2015-02-25 DIAGNOSIS — Y998 Other external cause status: Secondary | ICD-10-CM | POA: Insufficient documentation

## 2015-02-25 DIAGNOSIS — Y9289 Other specified places as the place of occurrence of the external cause: Secondary | ICD-10-CM | POA: Insufficient documentation

## 2015-02-25 DIAGNOSIS — S93402A Sprain of unspecified ligament of left ankle, initial encounter: Secondary | ICD-10-CM | POA: Insufficient documentation

## 2015-02-25 DIAGNOSIS — Z8619 Personal history of other infectious and parasitic diseases: Secondary | ICD-10-CM | POA: Insufficient documentation

## 2015-02-25 HISTORY — DX: Herpesviral infection, unspecified: B00.9

## 2015-02-25 IMAGING — DX DG ANKLE COMPLETE 3+V*L*
3 series · 3 of 3 positions shown · non-contrast
Comparison: LEFT foot radiograph [DATE]

CLINICAL DATA: Stepped off curb, rolled ankle and heard a pop
today.

EXAM:
LEFT ANKLE COMPLETE - 3+ VIEW; LEFT FOOT - COMPLETE 3+ VIEW

[ankle ap]
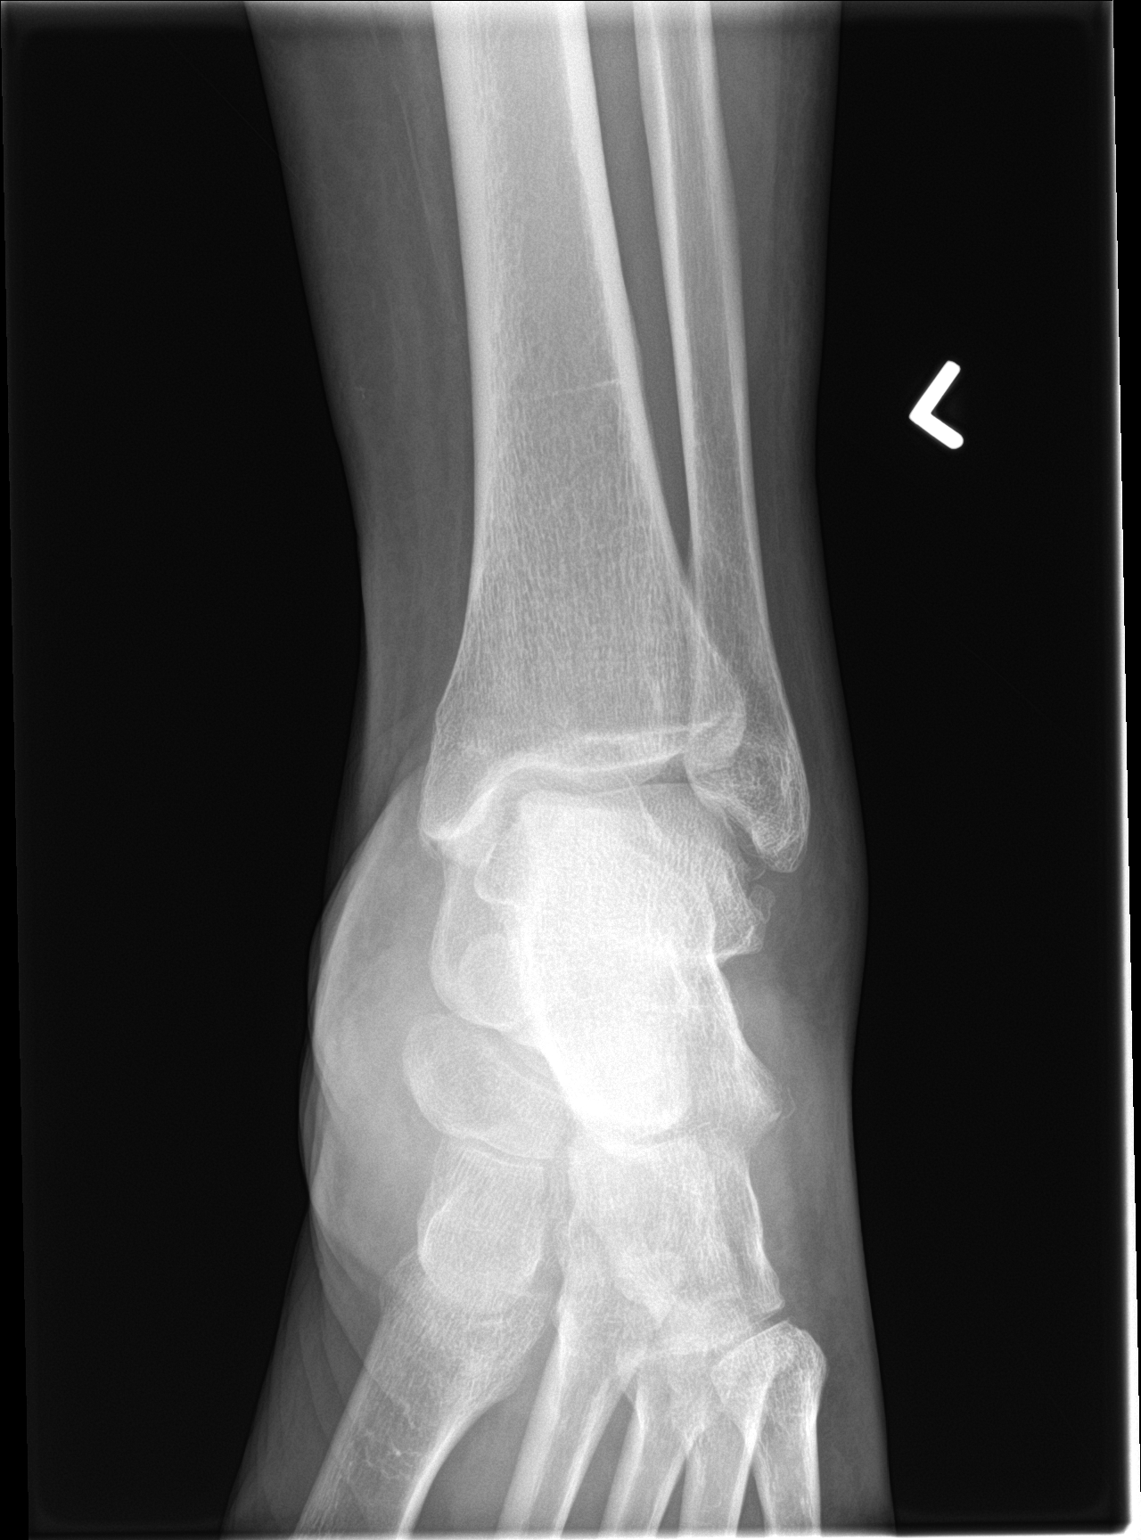

[ankle obl]
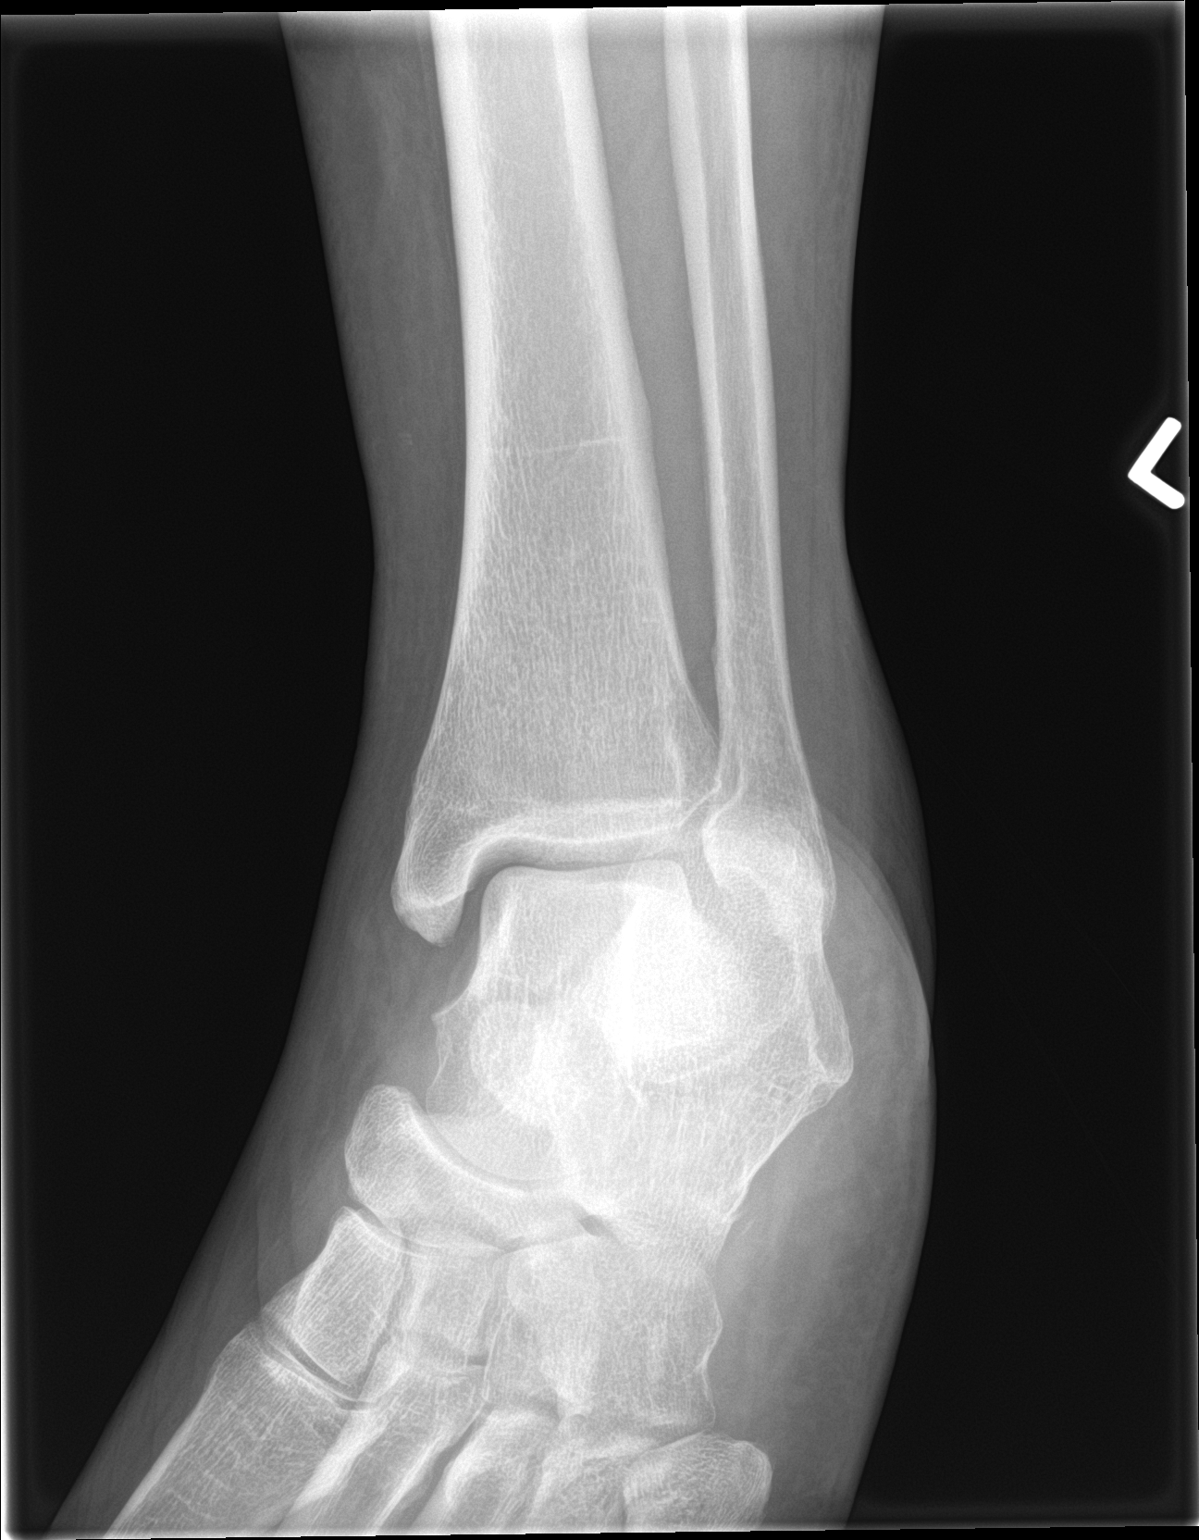

[ankle lat]
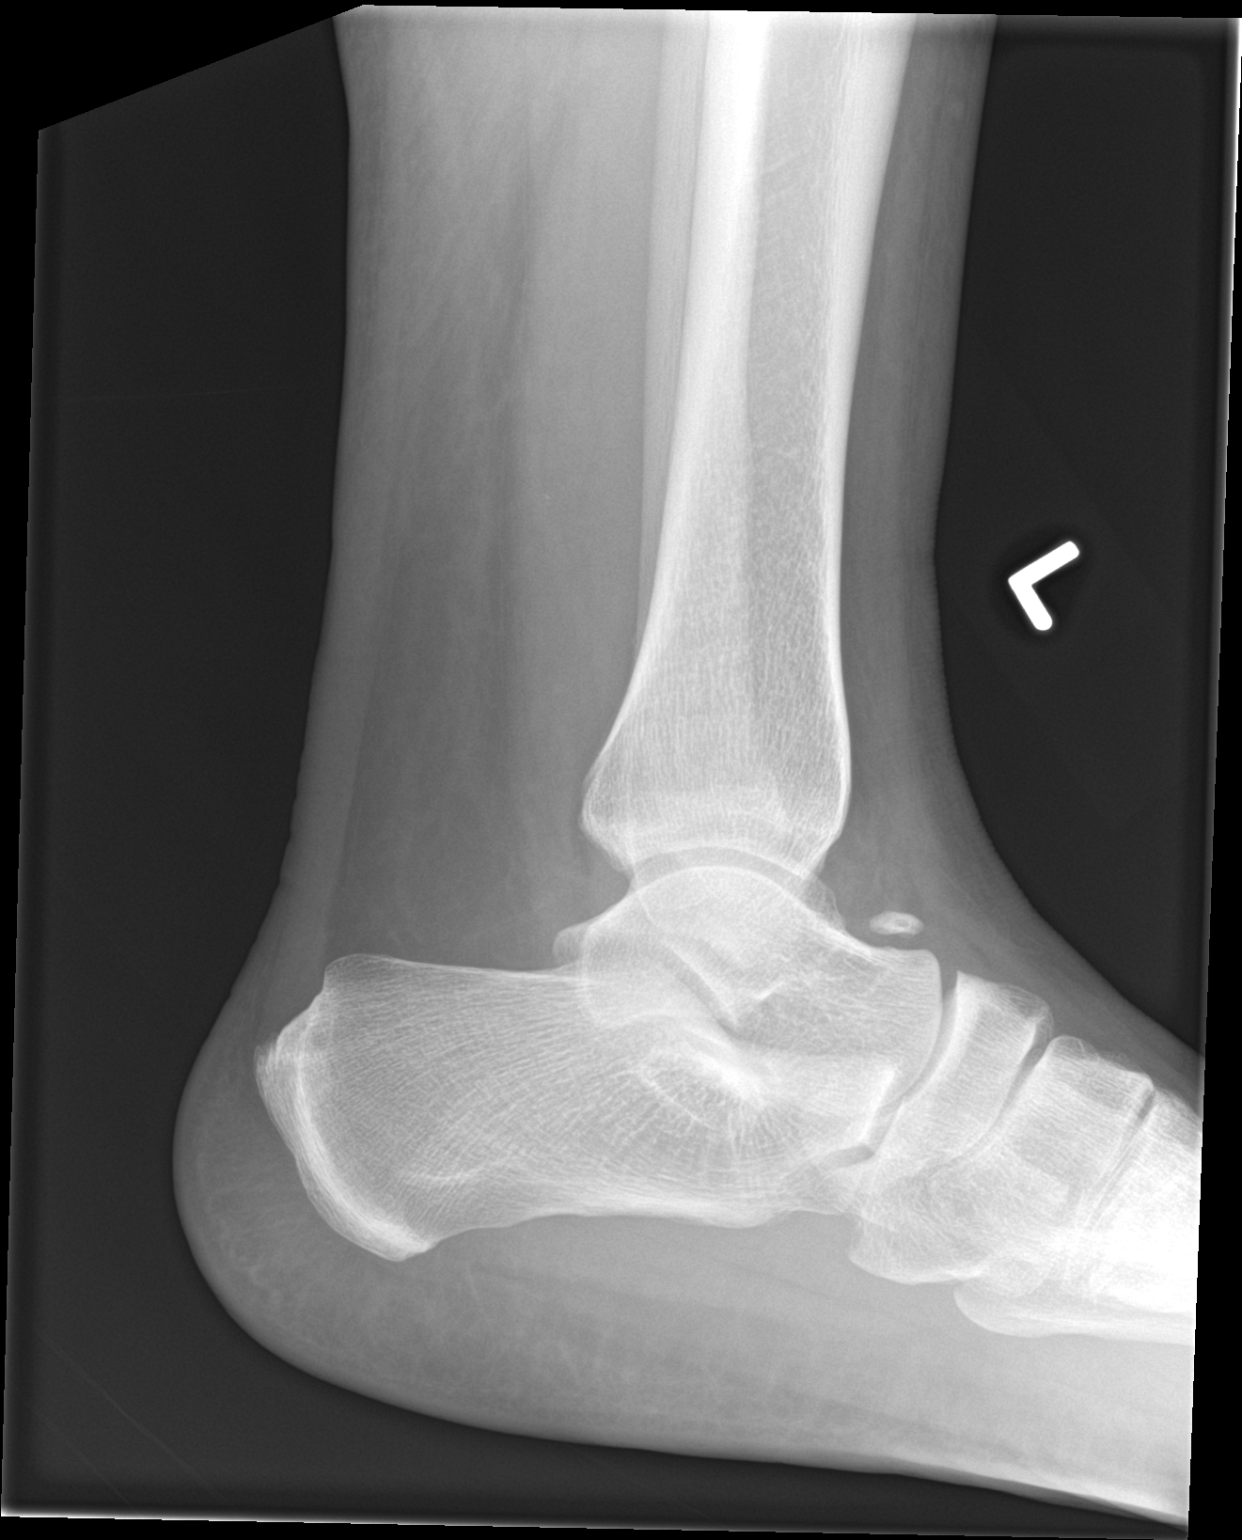

[3 of 3 positions shown; findings below may reference images not displayed]

FINDINGS: No fracture deformity nor dislocation. Corticated bony fragment
superior to the anterior talus again noted. Type 3 navicular bone.
The ankle mortise appears congruent and the tibiofibular syndesmosis
intact. Corticated bony fragment inferior to the lateral malleolus
could represent old avulsion injury. No destructive bony lesions.
Soft tissue planes are non-suspicious.
IMPRESSION: No acute fracture deformity or dislocation.

## 2015-02-25 IMAGING — DX DG FOOT COMPLETE 3+V*L*
3 series · 3 of 3 positions shown · non-contrast
Comparison: LEFT foot radiograph [DATE]

CLINICAL DATA: Stepped off curb, rolled ankle and heard a pop
today.

EXAM:
LEFT ANKLE COMPLETE - 3+ VIEW; LEFT FOOT - COMPLETE 3+ VIEW

[foot ap]
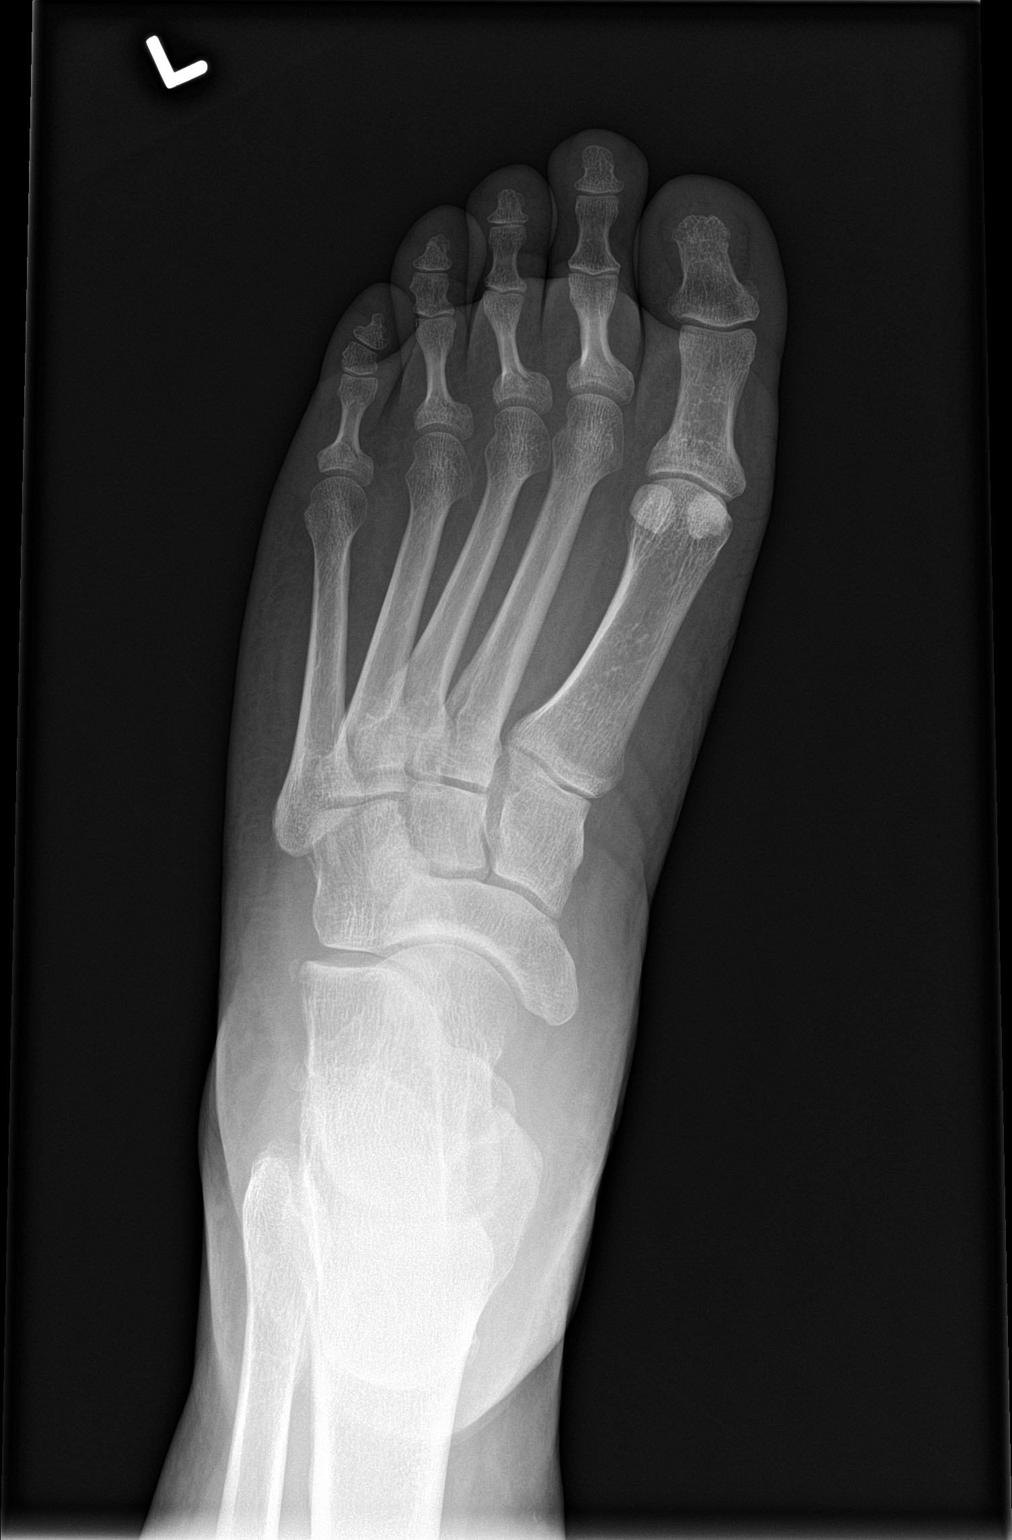

[foot obl]
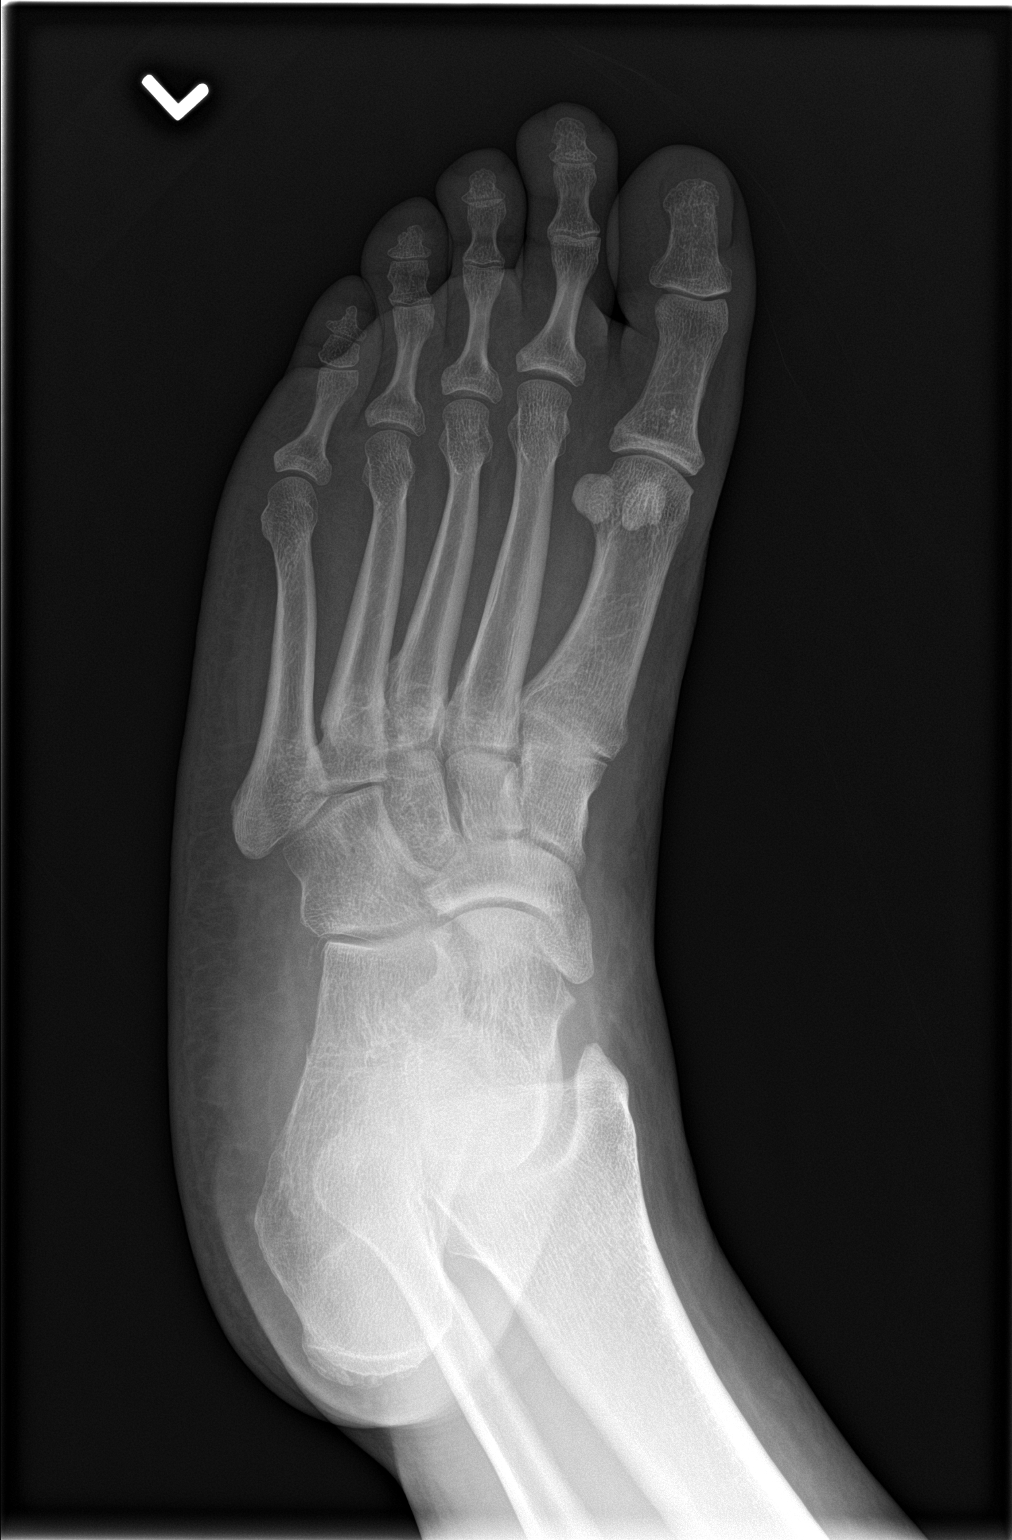

[foot lat]
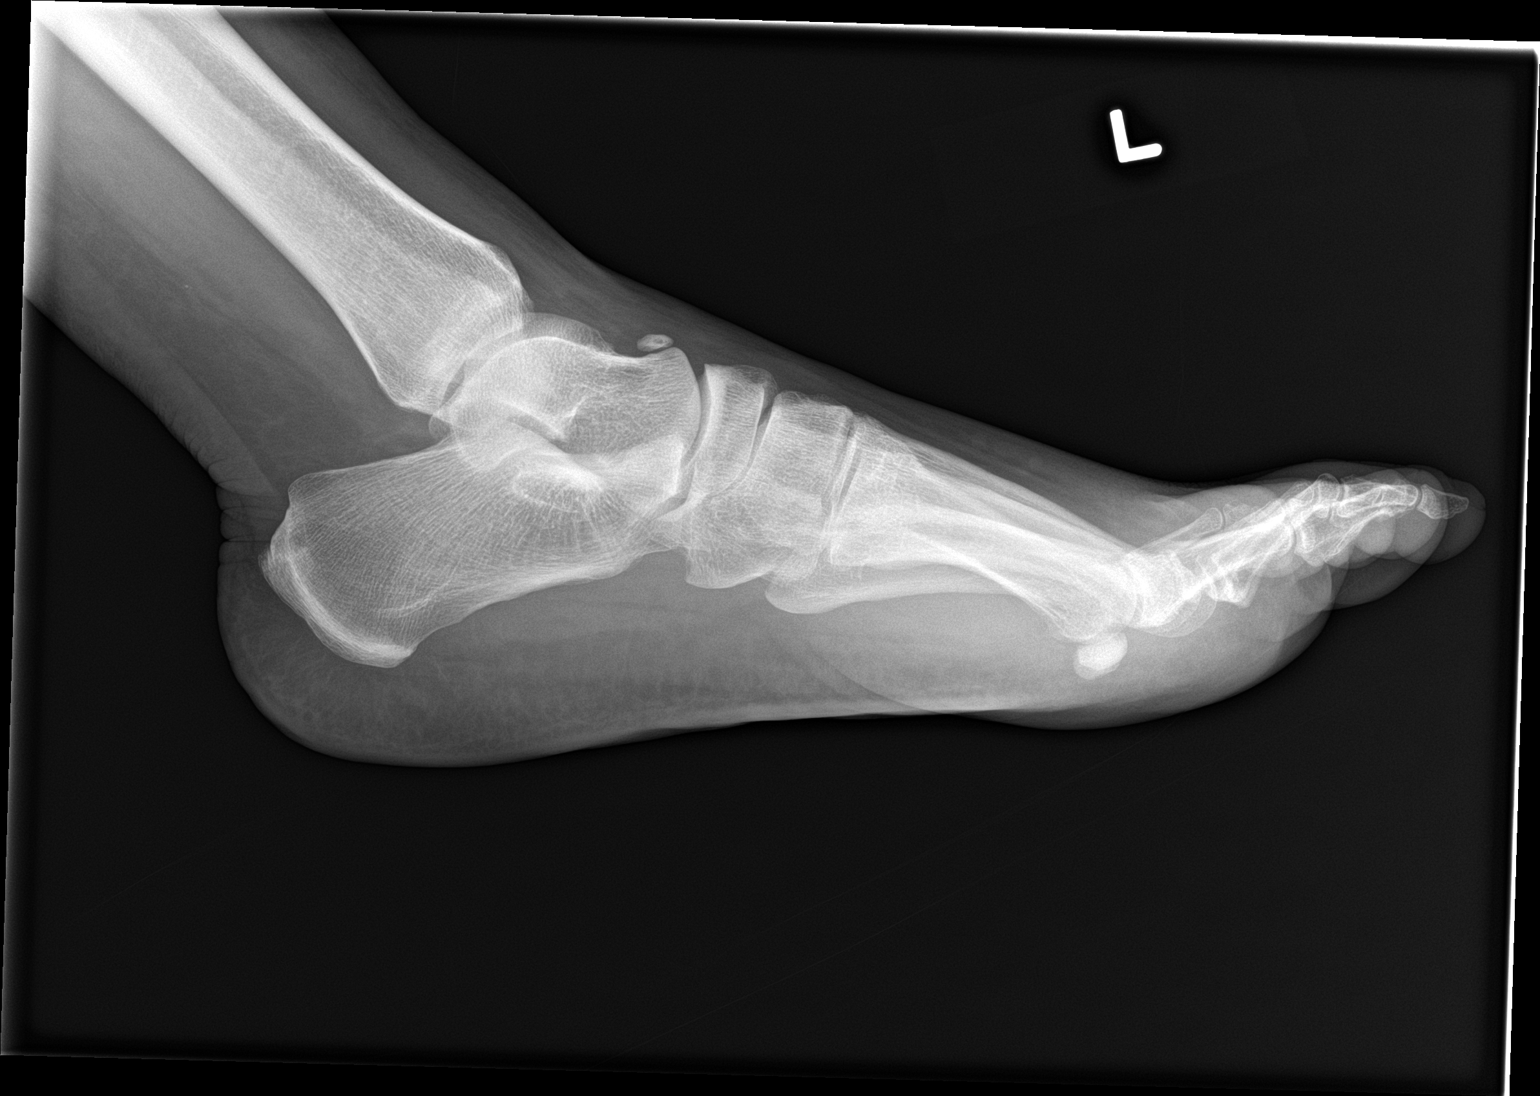

[3 of 3 positions shown; findings below may reference images not displayed]

FINDINGS: No fracture deformity nor dislocation. Corticated bony fragment
superior to the anterior talus again noted. Type 3 navicular bone.
The ankle mortise appears congruent and the tibiofibular syndesmosis
intact. Corticated bony fragment inferior to the lateral malleolus
could represent old avulsion injury. No destructive bony lesions.
Soft tissue planes are non-suspicious.
IMPRESSION: No acute fracture deformity or dislocation.

## 2015-02-25 NOTE — Discharge Instructions (Signed)

## 2015-02-25 NOTE — ED Provider Notes (Signed)
CSN: [id number]     Arrival date & time 02/25/15  2111 History  By signing my name below, I, Elon Spanner, attest that this documentation has been prepared under the direction and in the presence of Trisha Mangle, PA-C. Electronically Signed: Elon Spanner ED Scribe. 02/25/2015. 10:10 PM.    Chief Complaint  Patient presents with  . Leg Pain   The history is provided by the patient. No language interpreter was used.    HPI Comments: Donna Burns is a 34 y.o. female with hx of HTN (lisinopril) who presents to the Emergency Department complaining of left ankle pain and swelling onset PTA, worse with walking, no treatments tried.  The patient reports she tripped on a drain causing her to roller her ankle.  She denies prior injury to the area but reports prior left knee surgery by Cathleen Fears.  Patient takes meloxicam daily for right knee pain.  She denies left knee pain.     Past Medical History  Diagnosis Date  . Bilateral polycystic ovarian syndrome   . Hypertension   . HSV-2 (herpes simplex virus 2) infection    Past Surgical History  Procedure Laterality Date  . Mouth surgery    . Knee surgery     Family History  Problem Relation Age of Onset  . Heart failure Mother   . Diabetes Mother   . Hypertension Mother    Social History  Substance Use Topics  . Smoking status: Never Smoker   . Smokeless tobacco: None  . Alcohol Use: No   OB History    No data available     Review of Systems A complete 10 system review of systems was obtained and all systems are negative except as noted in the HPI and PMH.   Allergies  Pork-derived products  Home Medications   Prior to Admission medications   Medication Sig Start Date End Date Taking? 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     Historical Provider, MD  Diclofenac (ZORVOLEX) 35 MG CAPS Take 35 mg by mouth 3 (three) times daily after meals. 02/03/13   Linna Hoff, MD  Ibuprofen (ADVIL PO) Take 1 tablet by mouth every 8 (eight) hours as needed (For pain).    Historical Provider, MD  lisinopril (PRINIVIL,ZESTRIL) 20 MG tablet Take 0.5 tablets (10 mg total) by mouth daily. 09/10/12   Kathrynn Speed, PA-C  metFORMIN (GLUCOPHAGE) 500 MG tablet Take 500 mg by mouth daily at 12 noon.     Historical Provider, MD  nystatin-triamcinolone ointment (MYCOLOG) Apply 1 application topically 2 (two) times daily as needed (for antifungal).    Historical Provider, MD  pramoxine-hydrocortisone (PROCTOCREAM-HC) 1-1 % rectal cream Place rectally 2 (two) times daily. 11/18/12   Adlih Moreno-Coll, MD  traMADol (ULTRAM) 50 MG tablet Take 1 tablet (50 mg total) by mouth every 6 (six) hours as needed for pain. 12/21/12   Garlon Hatchet, PA-C   BP 184/118 mmHg  Pulse 85  Temp(Src) 98.4 F (36.9 C) (Oral)  Resp 16  SpO2 98% Physical Exam  Constitutional: She is oriented to person, place, and time. She appears well-developed and well-nourished. No distress.  HENT:  Head: Normocephalic and atraumatic.  Eyes: Conjunctivae and EOM are normal.  Neck: Neck supple. No tracheal deviation present.  Cardiovascular: Normal rate.   Pulmonary/Chest: Effort normal. No respiratory distress.  Musculoskeletal: Normal range of motion.  Neurological: She is alert  and oriented to person, place, and time.  Skin: Skin is warm and dry.  Slight swelling lateral ankle.  Dry scaly skin on foot consistent with eczema.    Psychiatric: She has a normal mood and affect. Her behavior is normal.  Nursing note and vitals reviewed.   ED Course  Procedures (including critical care time)  DIAGNOSTIC STUDIES: Oxygen Saturation is 98% on RA, normal by my interpretation.    COORDINATION OF CARE:  10:13 PM Will provide crutches and ankle brace.  Patient should ice, use Tylenol as needed for pain, and f/u with Cathleen Fears in 1 week.  Patient acknowledges and agrees with plan.    Labs Review Labs  Reviewed - No data to display  Imaging Review No results found. I have personally reviewed and evaluated these image results as part of my medical decision-making.   EKG Interpretation None      MDM   Final diagnoses:  Ankle sprain, left, initial encounter  Eczema    aso Crutches Ibuprofen avs See Dr. Eulah Pont for recheck in 1 week.   Lonia Skinner Schneider, PA-C 02/26/15 0100  Lavera Guise, MD 04/05/15 [phone]

## 2015-02-25 NOTE — ED Notes (Signed)
Pt reports to the ED for eval of left ankle pain. She reports she fell just PTA she tripped walking to her car. Heard a "pop." Slightly more swollen than the right. Denies any head injury or LOC. Pt A&OX4, resp e/u, and skin warm and dry.

## 2016-02-26 ENCOUNTER — Encounter (HOSPITAL_COMMUNITY): Payer: Self-pay | Admitting: Emergency Medicine

## 2016-02-26 DIAGNOSIS — Z79899 Other long term (current) drug therapy: Secondary | ICD-10-CM | POA: Insufficient documentation

## 2016-02-26 DIAGNOSIS — R51 Headache: Secondary | ICD-10-CM | POA: Insufficient documentation

## 2016-02-26 DIAGNOSIS — Z7984 Long term (current) use of oral hypoglycemic drugs: Secondary | ICD-10-CM | POA: Insufficient documentation

## 2016-02-26 DIAGNOSIS — I10 Essential (primary) hypertension: Secondary | ICD-10-CM | POA: Insufficient documentation

## 2016-02-26 LAB — COMPREHENSIVE METABOLIC PANEL
ALT: 19 U/L (ref 14–54)
ANION GAP: 6 (ref 5–15)
AST: 23 U/L (ref 15–41)
Albumin: 3.5 g/dL (ref 3.5–5.0)
Alkaline Phosphatase: 44 U/L (ref 38–126)
BUN: 11 mg/dL (ref 6–20)
CHLORIDE: 104 mmol/L (ref 101–111)
CO2: 28 mmol/L (ref 22–32)
Calcium: 9.5 mg/dL (ref 8.9–10.3)
Creatinine, Ser: 1.23 mg/dL — ABNORMAL HIGH (ref 0.44–1.00)
GFR calc non Af Amer: 56 mL/min — ABNORMAL LOW (ref >60)
Glucose, Bld: 101 mg/dL — ABNORMAL HIGH (ref 65–99)
Potassium: 4.1 mmol/L (ref 3.5–5.1)
SODIUM: 138 mmol/L (ref 135–145)
Total Bilirubin: 0.3 mg/dL (ref 0.3–1.2)
Total Protein: 7.7 g/dL (ref 6.5–8.1)

## 2016-02-26 LAB — CBC WITH DIFFERENTIAL/PLATELET
Basophils Absolute: 0 10*3/uL (ref 0.0–0.1)
Basophils Relative: 0 %
EOS ABS: 0.2 10*3/uL (ref 0.0–0.7)
EOS PCT: 4 %
HCT: 41.3 % (ref 36.0–46.0)
Hemoglobin: 13.6 g/dL (ref 12.0–15.0)
LYMPHS ABS: 2.1 10*3/uL (ref 0.7–4.0)
Lymphocytes Relative: 35 %
MCH: 29 pg (ref 26.0–34.0)
MCHC: 32.9 g/dL (ref 30.0–36.0)
MCV: 88.1 fL (ref 78.0–100.0)
MONOS PCT: 6 %
Monocytes Absolute: 0.4 10*3/uL (ref 0.1–1.0)
Neutro Abs: 3.4 10*3/uL (ref 1.7–7.7)
Neutrophils Relative %: 55 %
PLATELETS: 206 10*3/uL (ref 150–400)
RBC: 4.69 MIL/uL (ref 3.87–5.11)
RDW: 13.6 % (ref 11.5–15.5)
WBC: 6.1 10*3/uL (ref 4.0–10.5)

## 2016-02-26 LAB — URINALYSIS, ROUTINE W REFLEX MICROSCOPIC
Bilirubin Urine: NEGATIVE
Glucose, UA: NEGATIVE mg/dL
Hgb urine dipstick: NEGATIVE
KETONES UR: NEGATIVE mg/dL
Leukocytes, UA: NEGATIVE
NITRITE: NEGATIVE
PH: 7 (ref 5.0–8.0)
Protein, ur: NEGATIVE mg/dL
SPECIFIC GRAVITY, URINE: 1.017 (ref 1.005–1.030)

## 2016-02-26 LAB — POC URINE PREG, ED: PREG TEST UR: NEGATIVE

## 2016-02-26 NOTE — ED Triage Notes (Signed)
Pt. reports elevated blood pressure at home today she has not taken her blood pressure medication for 6 days ( Lisinopril 20 mg.) , pt. added mild headache.

## 2016-02-27 ENCOUNTER — Emergency Department (HOSPITAL_COMMUNITY)
Admission: EM | Admit: 2016-02-27 | Discharge: 2016-02-27 | Disposition: A | Discharge status: Home / Self Care | Payer: Self-pay | Attending: Emergency Medicine | Admitting: Emergency Medicine

## 2016-02-27 DIAGNOSIS — R519 Headache, unspecified: Secondary | ICD-10-CM

## 2016-02-27 DIAGNOSIS — R51 Headache: Secondary | ICD-10-CM

## 2016-02-27 DIAGNOSIS — I10 Essential (primary) hypertension: Secondary | ICD-10-CM

## 2016-02-27 MED ORDER — IBUPROFEN 400 MG PO TABS
600.0000 mg | ORAL_TABLET | Freq: Once | ORAL | Status: AC
  Administered 2016-02-27: 600 mg via ORAL
  Filled 2016-02-27: qty 1

## 2016-02-27 MED ORDER — LISINOPRIL 20 MG PO TABS
20.0000 mg | ORAL_TABLET | Freq: Once | ORAL | Status: AC
  Administered 2016-02-27: 20 mg via ORAL
  Filled 2016-02-27: qty 1

## 2016-02-27 MED ORDER — LISINOPRIL 20 MG PO TABS: 20.0000 mg | ORAL_TABLET | Freq: Every day | ORAL | 1 refills | Status: DC

## 2016-02-27 NOTE — ED Provider Notes (Signed)
[facility]-EMERGENCY DEPT Provider Note   CSN: [id number] Arrival date & time: 02/26/16  2136  By signing my name below, I, Sandrea Hammond, attest that this documentation has been prepared under the direction and in the presence of Derwood Kaplan, MD. Electronically Signed: Sandrea Hammond, ED Scribe. 02/27/16. 2:45 AM.     History   Chief Complaint Chief Complaint  Patient presents with  . Hypertension    HPI Comments: Donna Burns is a 35 y.o. female who presents to the Emergency Department because of a progressively-worsening, splitting headache with high blood pressure that started earlier today. Pt says she ran out of her prescribed lisinopril 20 mg daily dose that she takes for hypertension. Pt says she has experienced lightheadedness. She denies numbness, tingling, chest pain, and difficulty in breathing.    The history is provided by the patient. No language interpreter was used.    Past Medical History:  Diagnosis Date  . Bilateral polycystic ovarian syndrome   . HSV-2 (herpes simplex virus 2) infection   . Hypertension     Patient Active Problem List   Diagnosis Date Noted  . VAGINAL DISCHARGE 04/13/2010  . ANEMIA 10/27/2008  . VAGINITIS, BACTERIAL 10/27/2008  . HYPERTENSION, BENIGN ESSENTIAL 06/21/2008  . LEG CRAMPS 06/21/2008  . ENDOMETRIAL HYPERPLASIA UNSPECIFIED 01/18/2008  . OBESITY 10/02/2007  . ECZEMA 10/02/2007  . 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 DENTAL CARIES 08/19/2007  . POLYCYSTIC OVARIAN DISEASE 05/27/1997    Past Surgical History:  Procedure Laterality Date  . KNEE SURGERY    . MOUTH SURGERY      OB History    No data available       Home Medications    Prior to Admission medications   Medication Sig Start Date End Date Taking? 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     Historical Provider, MD  Diclofenac (ZORVOLEX) 35 MG CAPS Take 35 mg by mouth 3 (three) times daily  after meals. 02/03/13   Linna Hoff, MD  Ibuprofen (ADVIL PO) Take 1 tablet by mouth every 8 (eight) hours as needed (For pain).    Historical Provider, MD  lisinopril (PRINIVIL,ZESTRIL) 20 MG tablet Take 1 tablet (20 mg total) by mouth daily. 02/27/16   Derwood Kaplan, MD  metFORMIN (GLUCOPHAGE) 500 MG tablet Take 500 mg by mouth daily at 12 noon.     Historical Provider, MD  nystatin-triamcinolone ointment (MYCOLOG) Apply 1 application topically 2 (two) times daily as needed (for antifungal).    Historical Provider, MD  pramoxine-hydrocortisone (PROCTOCREAM-HC) 1-1 % rectal cream Place rectally 2 (two) times daily. 11/18/12   Adlih Moreno-Coll, MD  traMADol (ULTRAM) 50 MG tablet Take 1 tablet (50 mg total) by mouth every 6 (six) hours as needed for pain. 12/21/12   Garlon Hatchet, PA-C    Family History Family History  Problem Relation Age of Onset  . Heart failure Mother   . Diabetes Mother   . Hypertension Mother     Social History Social History  Substance Use Topics  . Smoking status: Never Smoker  . Smokeless tobacco: Never Used  . Alcohol use No     Allergies   Pork-derived products   Review of Systems Review of Systems  Respiratory: Negative for shortness of breath.   Cardiovascular: Negative for chest pain.  Neurological: Positive for light-headedness and headaches. Negative for numbness.     10 Systems reviewed and are negative for acute  change except as noted in the HPI.    Physical Exam Updated Vital Signs BP (!) 146/103 (BP Location: Right Arm)   Pulse 86   Temp 97.9 F (36.6 C) (Oral)   Resp 18   Ht 5\' 8"  (1.727 m)   Wt 285 lb (129.3 kg)   SpO2 95%   BMI 43.33 kg/m   Physical Exam  Constitutional: She is oriented to person, place, and time. She appears well-developed and well-nourished. No distress.  HENT:  Head: Normocephalic and atraumatic.  Right Ear: Hearing normal.  Left Ear: Hearing normal.  Nose: Nose normal.  Mouth/Throat: Oropharynx  is clear and moist and mucous membranes are normal.  Eyes: Conjunctivae and EOM are normal. Pupils are equal, round, and reactive to light.  Pupils 3 mm and equal  Neck: Normal range of motion. Neck supple.  Cardiovascular: Normal rate, regular rhythm, S1 normal and S2 normal.  Exam reveals no gallop and no friction rub.   No murmur heard. Pulmonary/Chest: Effort normal and breath sounds normal. No respiratory distress. She exhibits no tenderness.  Lungs clear to auscultation  Abdominal: Soft. Normal appearance and bowel sounds are normal. There is no hepatosplenomegaly. There is no tenderness. There is no rebound, no guarding, no tenderness at McBurney's point and negative Murphy's sign. No hernia.  Musculoskeletal: Normal range of motion.  Neurological: She is alert and oriented to person, place, and time. She has normal strength. No cranial nerve deficit or sensory deficit. Coordination normal. GCS eye subscore is 4. GCS verbal subscore is 5. GCS motor subscore is 6.  Sensation intact  Skin: Skin is warm, dry and intact. No rash noted. No cyanosis.  Psychiatric: She has a normal mood and affect. Her speech is normal and behavior is normal. Thought content normal.  Nursing note and vitals reviewed.    ED Treatments / Results   DIAGNOSTIC STUDIES: Oxygen Saturation is 98% on RA, normal by my interpretation.    COORDINATION OF CARE: 2:34 AM Discussed treatment plan with pt at bedside and pt agreed to plan.   Labs (all labs ordered are listed, but only abnormal results are displayed) Labs Reviewed  COMPREHENSIVE METABOLIC PANEL - Abnormal; Notable for the following:       Result Value   Glucose, Bld 101 (*)    Creatinine, Ser 1.23 (*)    GFR calc non Af Amer 56 (*)    All other components within normal limits  CBC WITH DIFFERENTIAL/PLATELET  URINALYSIS, ROUTINE W REFLEX MICROSCOPIC (NOT [facility])  POC URINE PREG, ED    EKG  EKG Interpretation None       Radiology No  results found.  Procedures Procedures (including critical care time)  Medications Ordered in ED Medications  ibuprofen (ADVIL,MOTRIN) tablet 600 mg (not administered)  lisinopril (PRINIVIL,ZESTRIL) tablet 20 mg (not administered)     Initial Impression / Assessment and Plan / ED Course  I have reviewed the triage vital signs and the nursing notes.  Pertinent labs & imaging results that were available during my care of the patient were reviewed by me and considered in my medical decision making (see chart for details).  Clinical Course   I personally performed the services described in this documentation, which was scribed in my presence. The recorded information has been reviewed and is accurate.   Pt has essential HTN - out of meds. Will give her rx of her home meds. She has a headache w/o neuro deficits, BP is not severely high and  we dont think it's a SAH -ibuprofen given.  Final Clinical Impressions(s) / ED Diagnoses   Final diagnoses:  Essential hypertension  Nonintractable headache, unspecified chronicity pattern, unspecified headache type    New Prescriptions New Prescriptions   LISINOPRIL (PRINIVIL,ZESTRIL) 20 MG TABLET    Take 1 tablet (20 mg total) by mouth daily.        Derwood Kaplan, MD 02/27/16 [phone]

## 2017-04-06 ENCOUNTER — Other Ambulatory Visit: Payer: Self-pay

## 2017-04-06 ENCOUNTER — Encounter (HOSPITAL_BASED_OUTPATIENT_CLINIC_OR_DEPARTMENT_OTHER): Payer: Self-pay | Admitting: Emergency Medicine

## 2017-04-06 ENCOUNTER — Emergency Department (HOSPITAL_BASED_OUTPATIENT_CLINIC_OR_DEPARTMENT_OTHER)
Admission: EM | Admit: 2017-04-06 | Discharge: 2017-04-06 | Disposition: A | Discharge status: Home / Self Care | Payer: BLUE CROSS/BLUE SHIELD | Attending: Emergency Medicine | Admitting: Emergency Medicine

## 2017-04-06 DIAGNOSIS — I10 Essential (primary) hypertension: Secondary | ICD-10-CM | POA: Diagnosis not present

## 2017-04-06 DIAGNOSIS — R1032 Left lower quadrant pain: Secondary | ICD-10-CM | POA: Diagnosis not present

## 2017-04-06 DIAGNOSIS — R109 Unspecified abdominal pain: Secondary | ICD-10-CM

## 2017-04-06 DIAGNOSIS — Z79899 Other long term (current) drug therapy: Secondary | ICD-10-CM | POA: Insufficient documentation

## 2017-04-06 LAB — CBC WITH DIFFERENTIAL/PLATELET
Basophils Absolute: 0.1 10*3/uL (ref 0.0–0.1)
Basophils Relative: 1 %
Eosinophils Absolute: 0.2 10*3/uL (ref 0.0–0.7)
Eosinophils Relative: 3 %
HCT: 36.6 % (ref 36.0–46.0)
Hemoglobin: 12 g/dL (ref 12.0–15.0)
LYMPHS ABS: 2.1 10*3/uL (ref 0.7–4.0)
Lymphocytes Relative: 35 %
MCH: 29.1 pg (ref 26.0–34.0)
MCHC: 32.8 g/dL (ref 30.0–36.0)
MCV: 88.8 fL (ref 78.0–100.0)
Monocytes Absolute: 0.5 10*3/uL (ref 0.1–1.0)
Monocytes Relative: 8 %
Neutro Abs: 3.2 10*3/uL (ref 1.7–7.7)
Neutrophils Relative %: 53 %
Platelets: 199 10*3/uL (ref 150–400)
RBC: 4.12 MIL/uL (ref 3.87–5.11)
RDW: 14.1 % (ref 11.5–15.5)
WBC: 6.1 10*3/uL (ref 4.0–10.5)

## 2017-04-06 LAB — COMPREHENSIVE METABOLIC PANEL
ALT: 14 U/L (ref 14–54)
AST: 19 U/L (ref 15–41)
Albumin: 3.7 g/dL (ref 3.5–5.0)
Alkaline Phosphatase: 44 U/L (ref 38–126)
Anion gap: 7 (ref 5–15)
BUN: 16 mg/dL (ref 6–20)
CO2: 22 mmol/L (ref 22–32)
Calcium: 9.1 mg/dL (ref 8.9–10.3)
Chloride: 109 mmol/L (ref 101–111)
Creatinine, Ser: 1.11 mg/dL — ABNORMAL HIGH (ref 0.44–1.00)
GFR calc Af Amer: >60 mL/min (ref >60)
GFR calc non Af Amer: >60 mL/min (ref >60)
Glucose, Bld: 94 mg/dL (ref 65–99)
Potassium: 4 mmol/L (ref 3.5–5.1)
Sodium: 138 mmol/L (ref 135–145)
Total Bilirubin: 0.2 mg/dL — ABNORMAL LOW (ref 0.3–1.2)
Total Protein: 7.9 g/dL (ref 6.5–8.1)

## 2017-04-06 LAB — URINALYSIS, ROUTINE W REFLEX MICROSCOPIC
Bilirubin Urine: NEGATIVE
Glucose, UA: NEGATIVE mg/dL
Hgb urine dipstick: NEGATIVE
Ketones, ur: NEGATIVE mg/dL
Leukocytes, UA: NEGATIVE
Nitrite: NEGATIVE
Protein, ur: NEGATIVE mg/dL
Specific Gravity, Urine: 1.02 (ref 1.005–1.030)
pH: 6 (ref 5.0–8.0)

## 2017-04-06 LAB — PREGNANCY, URINE: Preg Test, Ur: NEGATIVE

## 2017-04-06 LAB — LIPASE, BLOOD: Lipase: 27 U/L (ref 11–51)

## 2017-04-06 IMAGING — US US EXTREM LOW VENOUS*L*
1 series · 13 of 24 positions shown · non-contrast
Comparison: [DATE]

CLINICAL DATA: Left calf pain x1 day



[Series 1: us extrem low venous*left* · 0.07mm/px · 13 of 31 slices shown]
[im 1/31]
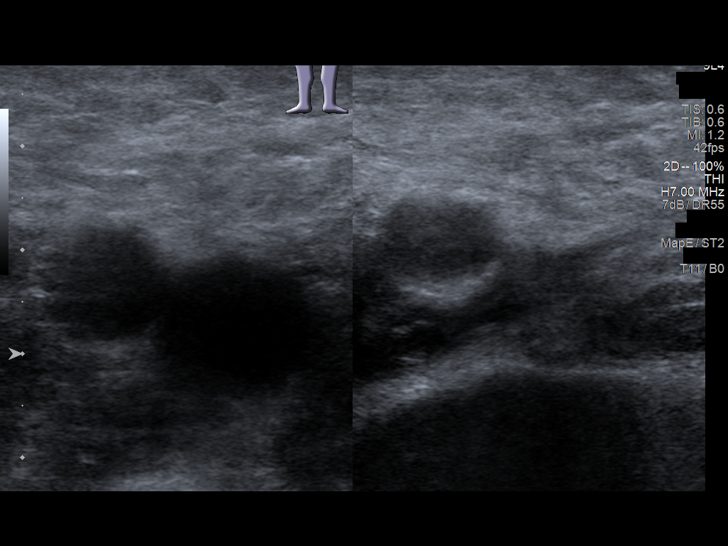
[im 3/31]
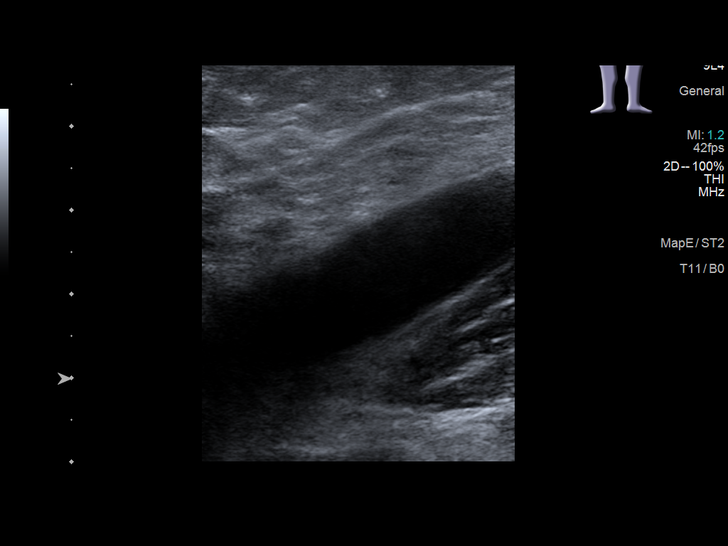
[im 6/31]
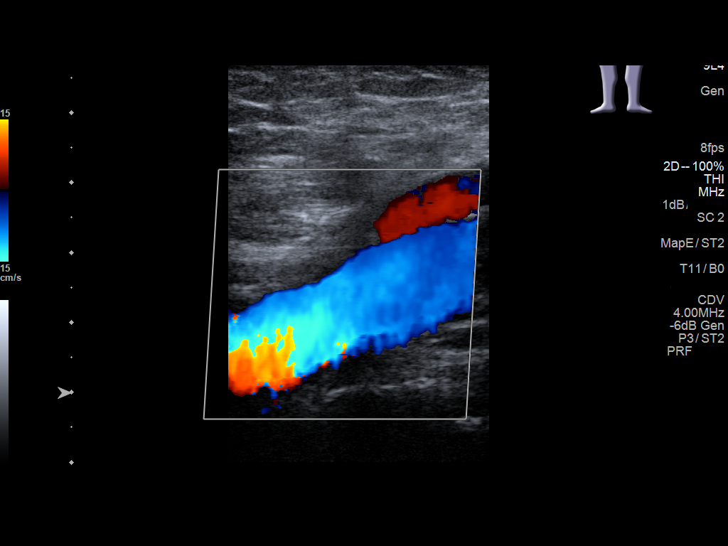
[im 8/31]
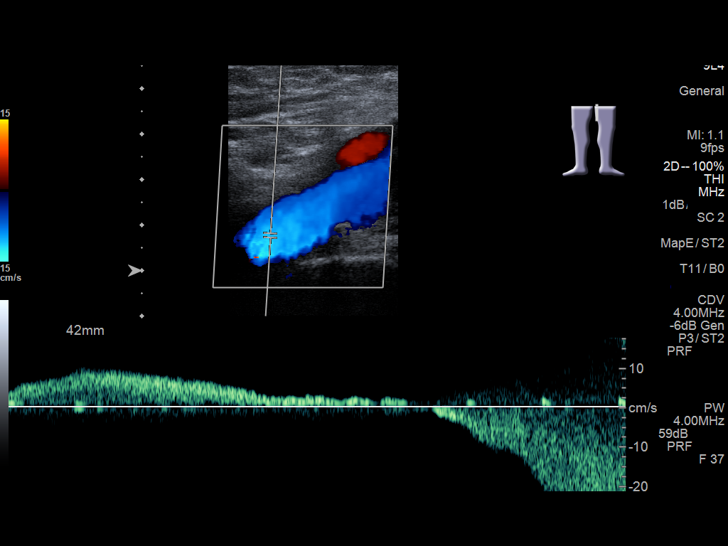
[im 11/31]
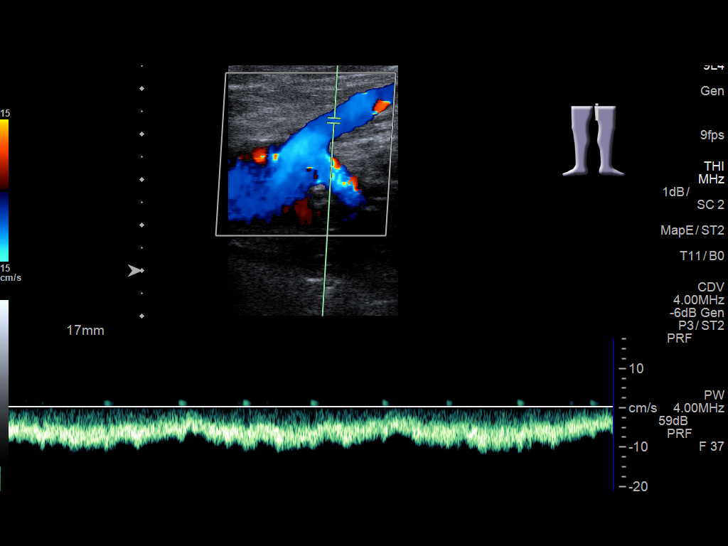
[im 14/31]
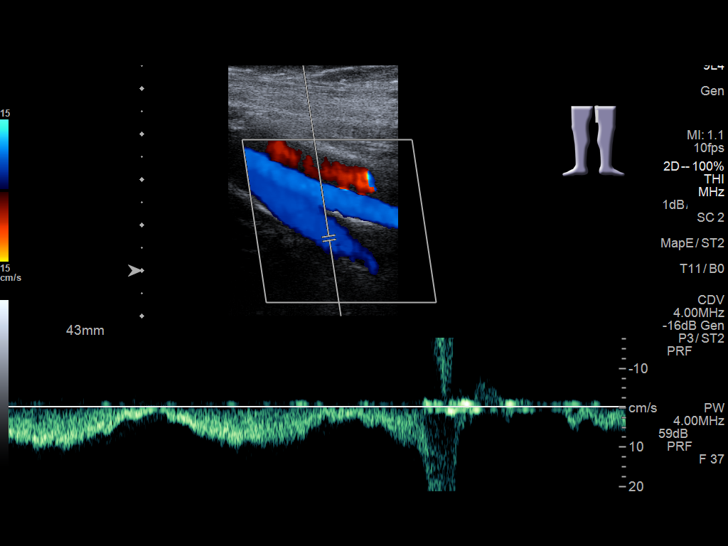
[im 16/31]
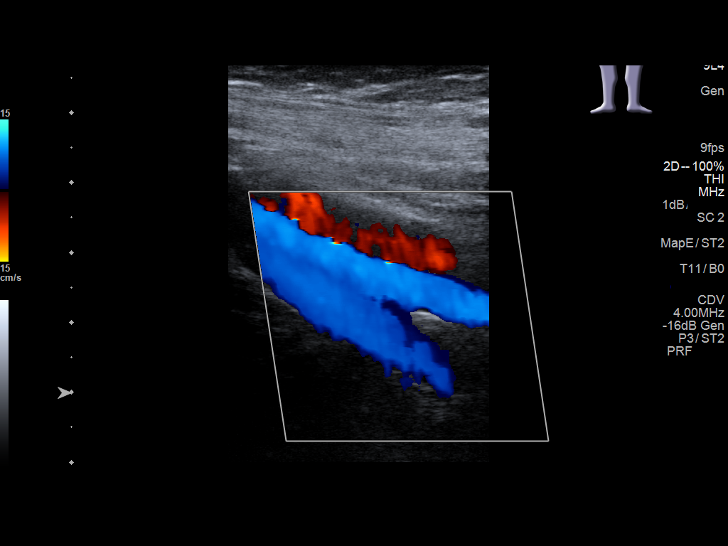
[im 17/31]
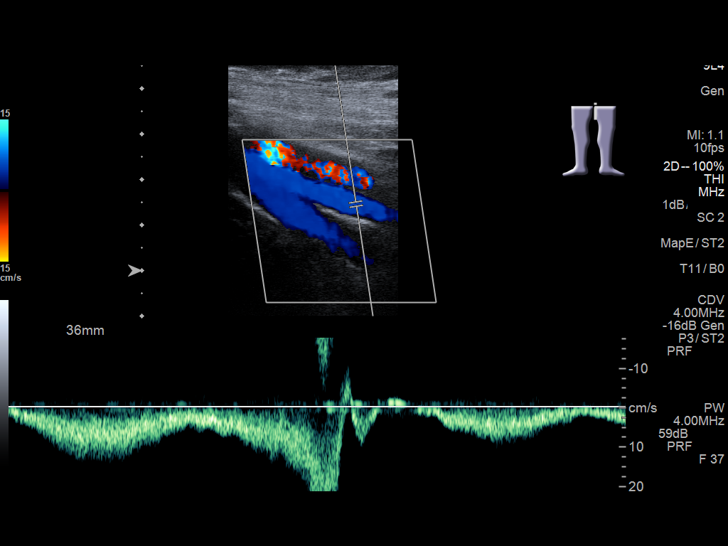
[im 20/31]
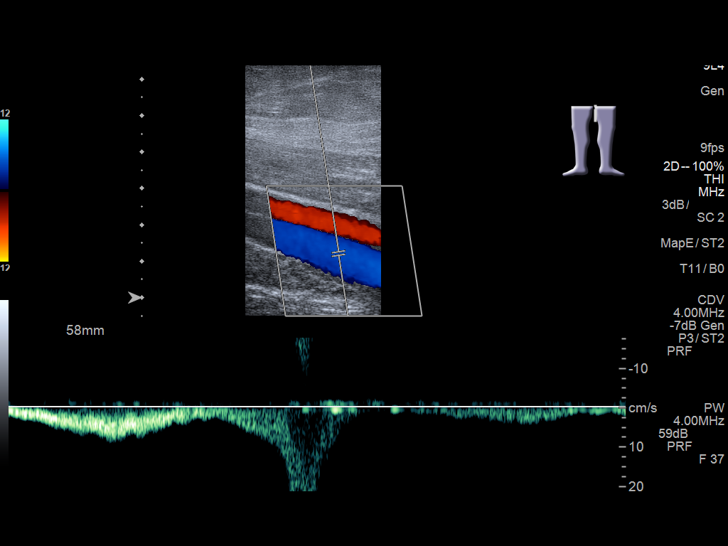
[im 23/31]
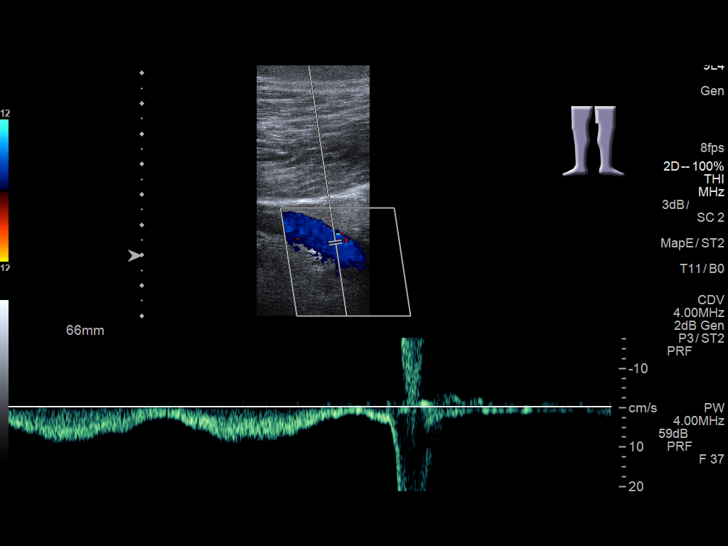
[im 25/31]
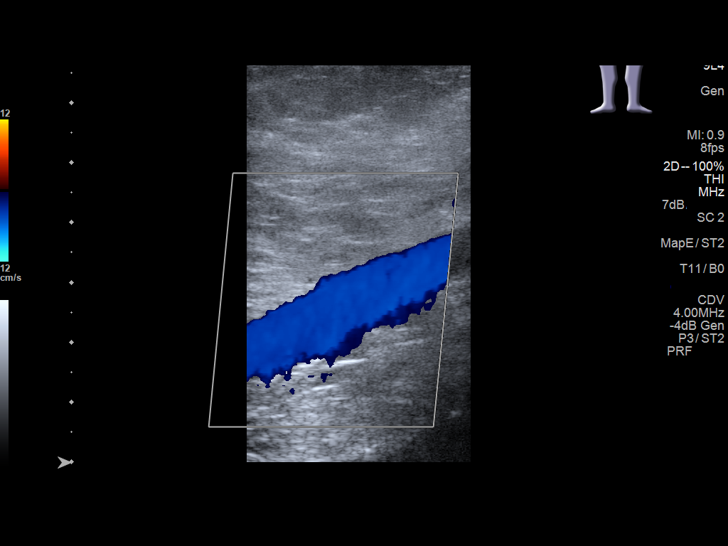
[im 28/31]
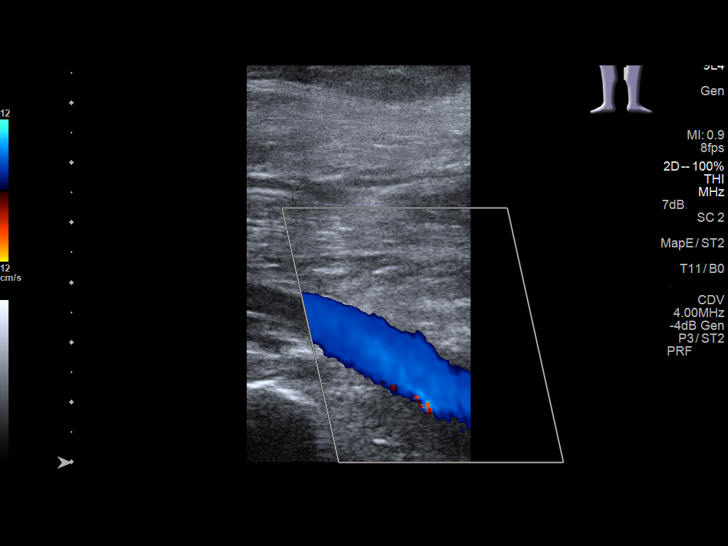
[im 31/31]
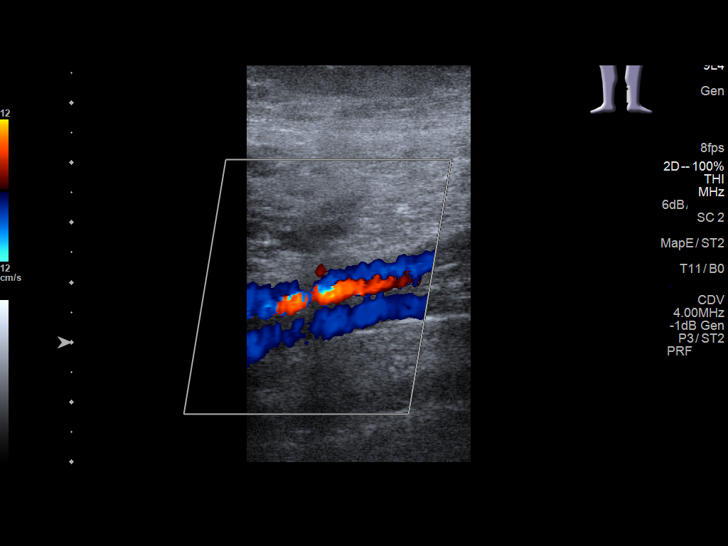

[13 of 24 positions shown; findings below may reference images not displayed]

FINDINGS: Contralateral Common Femoral Vein: Respiratory phasicity is normal
and symmetric with the symptomatic side. No evidence of thrombus.
Normal compressibility.

Common Femoral Vein: No evidence of thrombus. Normal
compressibility, respiratory phasicity and response to augmentation.

Saphenofemoral Junction: No evidence of thrombus. Normal
compressibility and flow on color Doppler imaging.

Profunda Femoral Vein: No evidence of thrombus. Normal
compressibility and flow on color Doppler imaging.

Femoral Vein: No evidence of thrombus. Normal compressibility,
respiratory phasicity and response to augmentation.

Popliteal Vein: No evidence of thrombus. Normal compressibility,
respiratory phasicity and response to augmentation.

Calf Veins: No evidence of thrombus. Normal compressibility and flow
on color Doppler imaging.

Other Findings:  None.
IMPRESSION: No evidence of deep venous thrombosis.

## 2017-04-06 MED ORDER — ACETAMINOPHEN 325 MG PO TABS
650.0000 mg | ORAL_TABLET | Freq: Once | ORAL | Status: AC
  Administered 2017-04-06: 650 mg via ORAL
  Filled 2017-04-06: qty 2

## 2017-04-06 NOTE — ED Notes (Signed)
Pt given d/c instructions as per chart. Verbalizes understanding. No questions. Will f/u with PCP tomorrow for BP. Also states has not taken BP med tonight.

## 2017-04-06 NOTE — ED Triage Notes (Signed)
L lower abd pain for "weeks", worse this week. Has been seen by GYN and PCP with no acute problem found. Denies urinary symptoms.

## 2017-04-06 NOTE — ED Provider Notes (Signed)
[facility] EMERGENCY DEPARTMENT Provider Note   CSN: [id number] Arrival date & time: 04/06/17  1825     History   Chief Complaint Chief Complaint  Patient presents with  . Abdominal Pain    HPI Donna Burns is a 36 y.o. female with a hx of PCOS who presents complaining of left lower abdominal pain x 3 weeks. Patient describes the pain as being to her left lower abdomen/hip area radiating to her L side. States that the pain is always present, but seems worse in the morning when she wakes up and with twisting/turning motions. Has tried Tylenol, Ibuprofen, and Ice without relief. States she started exercising at home including sit-up exercises around the time the pain began, she has however stopped exercising and has tried rest without relief. Has been seen by her gynecologist for this complaint, had a pelvic exam with a pap smear which was normal. Reports 1 new sexual partner, however this was prior to obgyn exam. Her gynecologist restarted her on Metformin for her PCOS at time of visit. Denies N/V/D, dysuria, hematuria, vaginal bleeding/discharge, fever, or chills.   HPI  Past Medical History:  Diagnosis Date  . Bilateral polycystic ovarian syndrome   . HSV-2 (herpes simplex virus 2) infection   . Hypertension     Patient Active Problem List   Diagnosis Date Noted  . VAGINAL DISCHARGE 04/13/2010  . ANEMIA 10/27/2008  . VAGINITIS, BACTERIAL 10/27/2008  . HYPERTENSION, BENIGN ESSENTIAL 06/21/2008  . LEG CRAMPS 06/21/2008  . ENDOMETRIAL HYPERPLASIA UNSPECIFIED 01/18/2008  . OBESITY 10/02/2007  . ECZEMA 10/02/2007  . 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 DENTAL CARIES 08/19/2007  . POLYCYSTIC OVARIAN DISEASE 05/27/1997    Past Surgical History:  Procedure Laterality Date  . KNEE SURGERY    . MOUTH SURGERY      OB History    No data available       Home Medications    Prior to Admission medications   Medication Sig Start Date End Date Taking? Authorizing  Provider  metFORMIN (GLUCOPHAGE) 500 MG tablet Take 500 mg by mouth daily at 12 noon.    Yes [provider]  metoprolol tartrate (LOPRESSOR) 25 MG tablet Take 25 mg 2 (two) times daily by mouth.   Yes [provider]  acyclovir (ZOVIRAX) 800 MG tablet Take 800 mg by mouth daily as needed (for HSV 2).     [provider]  Diclofenac (ZORVOLEX) 35 MG CAPS Take 35 mg by mouth 3 (three) times daily after meals. 02/03/13   Linna HoffKindl, James D, MD  Ibuprofen (ADVIL PO) Take 1 tablet by mouth every 8 (eight) hours as needed (For pain).    [provider]  lisinopril (PRINIVIL,ZESTRIL) 20 MG tablet Take 1 tablet (20 mg total) by mouth daily. 02/27/16   Derwood KaplanNanavati, Ankit, MD  nystatin-triamcinolone ointment (MYCOLOG) Apply 1 application topically 2 (two) times daily as needed (for antifungal).    [provider]  pramoxine-hydrocortisone (PROCTOCREAM-HC) 1-1 % rectal cream Place rectally 2 (two) times daily. 11/18/12   Moreno-Coll, Adlih, MD  traMADol (ULTRAM) 50 MG tablet Take 1 tablet (50 mg total) by mouth every 6 (six) hours as needed for pain. 12/21/12   Garlon HatchetSanders, Lisa M, PA-C    Family History Family History  Problem Relation Age of Onset  . Heart failure Mother   . Diabetes Mother   . Hypertension Mother     Social History Social History   Tobacco Use  . Smoking status: Never Smoker  .  Smokeless tobacco: Never Used  Substance Use Topics  . Alcohol use: No  . Drug use: No     Allergies   Pork-derived products   Review of Systems Review of Systems  Constitutional: Negative for chills and fever.  HENT: Negative for congestion.   Respiratory: Negative for cough and shortness of breath.   Cardiovascular: Negative for chest pain.  Gastrointestinal: Positive for abdominal pain (LLQ). Negative for constipation, diarrhea, nausea and vomiting.  Genitourinary: Negative for dysuria, hematuria, urgency, vaginal bleeding and vaginal discharge.    Musculoskeletal: Negative for neck pain.  Skin: Negative for rash.  Neurological: Negative for dizziness and numbness.  All other systems reviewed and are negative.    Physical Exam Updated Vital Signs BP (!) 184/112 (BP Location: Right Arm)   Pulse 83   Temp 99.1 F (37.3 C) (Oral)   Resp 16   Ht 5\' 8"  (1.727 m)   Wt 122 kg (269 lb)   LMP 03/15/2017   SpO2 100%   BMI 40.90 kg/m   Physical Exam  Constitutional: She appears well-developed and well-nourished. No distress.  HENT:  Head: Normocephalic and atraumatic.  Eyes: Conjunctivae are normal. Right eye exhibits no discharge. Left eye exhibits no discharge.  Cardiovascular: Normal rate and regular rhythm.  No murmur heard. Pulmonary/Chest: Breath sounds normal. No respiratory distress. She has no wheezes. She has no rales.  Abdominal: Soft. There is tenderness (Mild, non focal ) in the left lower quadrant. There is no rigidity, no rebound and no guarding. No hernia.  Neurological: She is alert.  Clear speech.   Skin: Skin is warm and dry. No rash noted.  Psychiatric: She has a normal mood and affect. Her behavior is normal.  Nursing note and vitals reviewed.    ED Treatments / Results   Results for orders placed or performed during the hospital encounter of 04/06/17  Lipase, blood  Result Value Ref Range   Lipase 27 11 - 51 U/L  Comprehensive metabolic panel  Result Value Ref Range   Sodium 138 135 - 145 mmol/L   Potassium 4.0 3.5 - 5.1 mmol/L   Chloride 109 101 - 111 mmol/L   CO2 22 22 - 32 mmol/L   Glucose, Bld 94 65 - 99 mg/dL   BUN 16 6 - 20 mg/dL   Creatinine, Ser 1.611.11 (H) 0.44 - 1.00 mg/dL   Calcium 9.1 8.9 - 09.610.3 mg/dL   Total Protein 7.9 6.5 - 8.1 g/dL   Albumin 3.7 3.5 - 5.0 g/dL   AST 19 15 - 41 U/L   ALT 14 14 - 54 U/L   Alkaline Phosphatase 44 38 - 126 U/L   Total Bilirubin 0.2 (L) 0.3 - 1.2 mg/dL   GFR calc non Af Amer >60 >60 mL/min   GFR calc Af Amer >60 >60 mL/min   Anion gap 7 5 - 15   Urinalysis, Routine w reflex microscopic  Result Value Ref Range   Color, Urine YELLOW YELLOW   APPearance CLEAR CLEAR   Specific Gravity, Urine 1.020 1.005 - 1.030   pH 6.0 5.0 - 8.0   Glucose, UA NEGATIVE NEGATIVE mg/dL   Hgb urine dipstick NEGATIVE NEGATIVE   Bilirubin Urine NEGATIVE NEGATIVE   Ketones, ur NEGATIVE NEGATIVE mg/dL   Protein, ur NEGATIVE NEGATIVE mg/dL   Nitrite NEGATIVE NEGATIVE   Leukocytes, UA NEGATIVE NEGATIVE  Pregnancy, urine  Result Value Ref Range   Preg Test, Ur NEGATIVE NEGATIVE  CBC with Differential/Platelet  Result Value Ref  Range   WBC 6.1 4.0 - 10.5 K/uL   RBC 4.12 3.87 - 5.11 MIL/uL   Hemoglobin 12.0 12.0 - 15.0 g/dL   HCT 96.0 45.4 - 09.8 %   MCV 88.8 78.0 - 100.0 fL   MCH 29.1 26.0 - 34.0 pg   MCHC 32.8 30.0 - 36.0 g/dL   RDW 11.9 14.7 - 82.9 %   Platelets 199 150 - 400 K/uL   Neutrophils Relative % 53 %   Lymphocytes Relative 35 %   Monocytes Relative 8 %   Eosinophils Relative 3 %   Basophils Relative 1 %   Neutro Abs 3.2 1.7 - 7.7 K/uL   Lymphs Abs 2.1 0.7 - 4.0 K/uL   Monocytes Absolute 0.5 0.1 - 1.0 K/uL   Eosinophils Absolute 0.2 0.0 - 0.7 K/uL   Basophils Absolute 0.1 0.0 - 0.1 K/uL   Smear Review MORPHOLOGY UNREMARKABLE    No results found. Medications Ordered in ED Medications  acetaminophen (TYLENOL) tablet 650 mg (650 mg Oral Given 04/06/17 2201)    Initial Impression / Assessment and Plan / ED Course  I have reviewed the triage vital signs and the nursing notes.  Pertinent labs & imaging results that were available during my care of the patient were reviewed by me and considered in my medical decision making (see chart for details).  Patient presents with left lower abdominal discomfort. She is nontoxic appearing, afebrile, and HR is WNL. Given constant pain without significant variation over the past 3 weeks with nonfocal exam doubt acute abdominal process. Screening labs were grossly normal other than creatinine  elevated at 1.11, this appears to be patient's baseline in comparison to previous labs- last creatinine in October 2017 was 1.23. Given normal labs (UA, CBC, CMP, lipase) doubt cystitis/pyelonephritis, pancreatitis, or diverticulitis. Given gyn evaluation and length of symptoms doubt ovarian torsion. Tylenol was given for pain. Discussed lab results with patient. Discussed the risk vs. Benefit of a CT scan, patient in agreement that this is not necessary. Will follow up with PCP for a re-evaluation of her discomfort as well as for a re-check of her blood pressure as this was elevated in the emergency department today- denies change in vision, chest pain, dyspnea, numbness, tingling, or weakness. Discussed return precautions including worsening/new/concerning symptoms.   Vitals:   04/06/17 1842 04/06/17 2051  BP: (!) 175/110 (!) 184/112  Pulse: 84 83  Resp: 20 16  Temp: 99.1 F (37.3 C)   SpO2: 100% 100%    Final Clinical Impressions(s) / ED Diagnoses   Final diagnoses:  Abdominal pain, unspecified abdominal location    ED Discharge Orders    None       Desmond Lope 04/06/17 2300    Raeford Razor, MD 04/08/17 1239

## 2017-04-06 NOTE — ED Notes (Signed)
ED Provider at bedside. 

## 2017-04-06 NOTE — Discharge Instructions (Addendum)
Follow up with your primary care provider within 3 days for further evaluation and for a re-check of your blood pressure as this was elevated in the emergency department today. Continue to rest, apply ice, and take Tylenol for the discomfort.   Return to the emergency department for any new or worsening symptoms.

## 2017-04-06 NOTE — ED Notes (Signed)
Pt requesting a female provider. EDP and PA made aware

## 2018-04-06 ENCOUNTER — Emergency Department: Payer: Self-pay

## 2018-04-06 ENCOUNTER — Other Ambulatory Visit: Payer: Self-pay

## 2018-04-06 ENCOUNTER — Emergency Department
Admission: EM | Admit: 2018-04-06 | Discharge: 2018-04-07 | Disposition: A | Discharge status: Home / Self Care | Payer: Self-pay | Attending: Emergency Medicine | Admitting: Emergency Medicine

## 2018-04-06 DIAGNOSIS — I10 Essential (primary) hypertension: Secondary | ICD-10-CM | POA: Insufficient documentation

## 2018-04-06 DIAGNOSIS — M79605 Pain in left leg: Secondary | ICD-10-CM | POA: Insufficient documentation

## 2018-04-06 DIAGNOSIS — M5432 Sciatica, left side: Secondary | ICD-10-CM | POA: Insufficient documentation

## 2018-04-06 DIAGNOSIS — Z79899 Other long term (current) drug therapy: Secondary | ICD-10-CM | POA: Insufficient documentation

## 2018-04-06 DIAGNOSIS — M79662 Pain in left lower leg: Secondary | ICD-10-CM

## 2018-04-06 NOTE — ED Triage Notes (Signed)
Pt in with co left leg weakness and numbness states intermittent episodes throughout the day. Pt denies any injury, states does have calf pain.

## 2018-04-06 NOTE — ED Provider Notes (Signed)
[facility] Emergency Department Provider Note  ____________________________________________   First MD Initiated Contact with Patient 04/06/18 2333     (approximate)  I have reviewed the triage vital signs and the nursing notes.   HISTORY  Chief Complaint Extremity Weakness    HPI Donna Burns is a 37 y.o. female with medical history as listed below who presents for evaluation of left leg pain, numbness, and tingling that has been intermittent throughout the day.  She reports that she has had some pain in the upper back part of her leg but today it is radiating down into the calf.  She describes it as a burning and aching pain that is accompanied with some numbness and tingling and she feels like her leg gave out on her a couple times during the day.  She is currently ambulating without any difficulty although occasionally she will limp slightly.  She reports that the symptoms have been anywhere from mild to severe at times.  Nothing in particular seems to make it better or worse.  She has no known trauma of which she is aware.  She has no history of sciatica nor blood clots in the legs of the lungs.  She has no back pain but the pain does radiate up into the left rear part of her pelvis.  She has no numbness, pain, nor weakness in her arms or her right leg.  She denies headache, visual changes, chest pain, shortness of breath, nausea, vomiting, abdominal pain, and dysuria.  She takes metformin for her PCOS but does not have a diagnosis of diabetes.  She does have chronic hypertension for which she is working with her primary care doctor to establish an effective regimen.  Her medications recently changed but she says that she is compliant with her treatment.  Past Medical History:  Diagnosis Date  . Bilateral polycystic ovarian syndrome   . HSV-2 (herpes simplex virus 2) infection   . Hypertension     Patient Active Problem List   Diagnosis Date Noted  .  VAGINAL DISCHARGE 04/13/2010  . ANEMIA 10/27/2008  . VAGINITIS, BACTERIAL 10/27/2008  . HYPERTENSION, BENIGN ESSENTIAL 06/21/2008  . LEG CRAMPS 06/21/2008  . ENDOMETRIAL HYPERPLASIA UNSPECIFIED 01/18/2008  . OBESITY 10/02/2007  . ECZEMA 10/02/2007  . 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 DENTAL CARIES 08/19/2007  . POLYCYSTIC OVARIAN DISEASE 05/27/1997    Past Surgical History:  Procedure Laterality Date  . KNEE SURGERY    . MOUTH SURGERY      Prior to Admission medications   Medication Sig Start Date End Date Taking? 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     [provider]  Diclofenac (ZORVOLEX) 35 MG CAPS Take 35 mg by mouth 3 (three) times daily after meals. 02/03/13   Linna Hoff, MD  Ibuprofen (ADVIL PO) Take 1 tablet by mouth every 8 (eight) hours as needed (For pain).    [provider]  lisinopril (PRINIVIL,ZESTRIL) 20 MG tablet Take 1 tablet (20 mg total) by mouth daily. 02/27/16   Derwood Kaplan, MD  metFORMIN (GLUCOPHAGE) 500 MG tablet Take 500 mg by mouth daily at 12 noon.     [provider]  metoprolol tartrate (LOPRESSOR) 25 MG tablet Take 25 mg 2 (two) times daily by mouth.    [provider]  nystatin-triamcinolone ointment (MYCOLOG) Apply 1 application topically 2 (two) times daily as needed (for antifungal).  [provider]  pramoxine-hydrocortisone (PROCTOCREAM-HC) 1-1 % rectal cream Place rectally 2 (two) times daily. 11/18/12   Moreno-Coll, Adlih, MD  traMADol (ULTRAM) 50 MG tablet Take 1 tablet (50 mg total) by mouth every 6 (six) hours as needed for pain. 12/21/12   Garlon Hatchet, PA-C    Allergies Pork-derived products  Family History  Problem Relation Age of Onset  . Heart failure Mother   . Diabetes Mother   . Hypertension Mother     Social History Social History   Tobacco Use  . Smoking status: Never Smoker  . Smokeless tobacco:  Never Used  Substance Use Topics  . Alcohol use: No  . Drug use: No    Review of Systems Constitutional: No fever/chills Eyes: No visual changes. ENT: No sore throat. Cardiovascular: Denies chest pain. Respiratory: Denies shortness of breath. Gastrointestinal: No abdominal pain.  No nausea, no vomiting.  No diarrhea.  No constipation. Genitourinary: Negative for dysuria. Musculoskeletal: Pain in the left lower extremity as described above.  No other extremity pain. Integumentary: Negative for rash. Neurological: Pain, numbness, and weakness in left lower extremity as described above.  No other focal neurological deficits.   ____________________________________________   PHYSICAL EXAM:  VITAL SIGNS: ED Triage Vitals [04/06/18 2224]  Enc Vitals Group     BP (!) 166/112     Pulse Rate 95     Resp 20     Temp 98.5 F (36.9 C)     Temp Source Oral     SpO2 98 %     Weight 127 kg (280 lb)     Height 1.727 m (5\' 8" )     Head Circumference      Peak Flow      Pain Score 8     Pain Loc      Pain Edu?      Excl. in GC?     Constitutional: Alert and oriented. Well appearing and in no acute distress. Eyes: Conjunctivae are normal.  Head: Atraumatic. Nose: No congestion/rhinnorhea. Mouth/Throat: Mucous membranes are moist. Neck: No stridor.  No meningeal signs.   Cardiovascular: Normal rate, regular rhythm. Good peripheral circulation. Grossly normal heart sounds. Respiratory: Normal respiratory effort.  No retractions. Lungs CTAB. Gastrointestinal: Soft and nontender. No distention.  Musculoskeletal: No lower extremity tenderness nor edema. No gross deformities of extremities. Neurologic:  Normal speech and language. No gross focal neurologic deficits are appreciated.  She has normal grip strength, upper muscle group strength, and lower muscle group strength bilaterally.  She has some mild tenderness to palpation of the left posterior calf and lower extremity. Skin:  Skin  is warm, dry and intact. No rash noted. Psychiatric: Mood and affect are normal. Speech and behavior are normal.  ____________________________________________   LABS (all labs ordered are listed, but only abnormal results are displayed)  Labs Reviewed - No data to display ____________________________________________  EKG  None - EKG not ordered by ED physician ____________________________________________  RADIOLOGY   ED MD interpretation: No indication of DVT  Official radiology report(s): US Venous Img Lower Unilateral Left  Result Date: 04/06/2018 CLINICAL DATA:  Left calf pain x1 day EXAM: Left LOWER EXTREMITY VENOUS DOPPLER ULTRASOUND TECHNIQUE: Gray-scale sonography with graded compression, as well as color Doppler and duplex ultrasound were performed to evaluate the lower extremity deep venous systems from the level of the common femoral vein and including the common femoral, femoral, profunda femoral, popliteal and calf veins including the posterior tibial, peroneal and gastrocnemius veins  when visible. The superficial great saphenous vein was also interrogated. Spectral Doppler was utilized to evaluate flow at rest and with distal augmentation maneuvers in the common femoral, femoral and popliteal veins. COMPARISON:  02/25/2015 FINDINGS: Contralateral Common Femoral Vein: Respiratory phasicity is normal and symmetric with the symptomatic side. No evidence of thrombus. Normal compressibility. Common Femoral Vein: No evidence of thrombus. Normal compressibility, respiratory phasicity and response to augmentation. Saphenofemoral Junction: No evidence of thrombus. Normal compressibility and flow on color Doppler imaging. Profunda Femoral Vein: No evidence of thrombus. Normal compressibility and flow on color Doppler imaging. Femoral Vein: No evidence of thrombus. Normal compressibility, respiratory phasicity and response to augmentation. Popliteal Vein: No evidence of thrombus. Normal  compressibility, respiratory phasicity and response to augmentation. Calf Veins: No evidence of thrombus. Normal compressibility and flow on color Doppler imaging. Other Findings:  None. IMPRESSION: No evidence of deep venous thrombosis. Electronically Signed   By: Jasmine Pang M.D.   On: 04/06/2018 23:51    ____________________________________________   PROCEDURES  Critical Care performed: No   Procedure(s) performed:   Procedures   ____________________________________________   INITIAL IMPRESSION / ASSESSMENT AND PLAN / ED COURSE  As part of my medical decision making, I reviewed the following data within the electronic MEDICAL RECORD NUMBER Nursing notes reviewed and incorporated    Differential diagnosis includes, but is not limited to, sciatica, musculoskeletal strain, DVT, less likely CVA.  Patient has no risk factors for DVT and a negative ultrasound.  Her symptoms are most consistent with sciatica.  She has no decrease in strength and as I explained to her, it is very unlikely to be a stroke given that CVA does not cause pain but rather causes an absence of sensation and strength.  She is ambulating without difficulty and has continued to have some achy pain that is most well explained by sciatica.  I had my usual customary sciatica discussion.  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  Her vital signs are notable for hypertension but this sounds like a chronic condition and is unlikely to be the cause of her chief complaint tonight.  Given that she is working with her primary care doctor on adequate blood pressure control, encouraged her to follow-up with her doctor for a recheck and for additional antihypertensives as needed.  The patient understands and agrees with the plan and will try over-the-counter ibuprofen 3 times a day with meals as well as Tylenol to see if this helps.  I do not think that prednisone would be beneficial for her given the questionable efficacy in the  first place but also the fact that it may cause glycemic control issues for her.  I gave my usual customary return precautions.    ____________________________________________  FINAL CLINICAL IMPRESSION(S) / ED DIAGNOSES  Final diagnoses:  Left sided sciatica  Essential hypertension     MEDICATIONS GIVEN DURING THIS VISIT:  Medications - No data to display   ED Discharge Orders    None       Note:  This document was prepared using Dragon voice recognition software and may include unintentional dictation errors.    Loleta Rose, MD 04/07/18 [phone]

## 2018-04-07 NOTE — Discharge Instructions (Signed)
As we discussed, your ultrasound was reassuring with no evidence of blood clot.  The pain and numbness/tingling in your left leg are most likely the result of sciatica.  Please read through the included information and try taking ibuprofen 600 mg 3 times a day with meals for at least the next 5 to 7 days.  You may also take over-the-counter Tylenol according to label instructions.  Follow-up with your regular doctor at the next available opportunity.  Your blood pressure was quite high tonight but this sounds like a chronic condition and does not explain the leg pain that you are having, but you may benefit from working with your doctor on improved blood pressure control.    Return to the emergency department if you develop new or worsening symptoms that concern you.

## 2019-02-09 ENCOUNTER — Emergency Department (HOSPITAL_COMMUNITY): Payer: Self-pay

## 2019-02-09 ENCOUNTER — Other Ambulatory Visit: Payer: Self-pay

## 2019-02-09 ENCOUNTER — Encounter (HOSPITAL_COMMUNITY): Payer: Self-pay | Admitting: Emergency Medicine

## 2019-02-09 ENCOUNTER — Emergency Department (HOSPITAL_COMMUNITY)
Admission: EM | Admit: 2019-02-09 | Discharge: 2019-02-09 | Discharge status: Against Medical Advice | Payer: Self-pay | Attending: Emergency Medicine | Admitting: Emergency Medicine

## 2019-02-09 DIAGNOSIS — Z5321 Procedure and treatment not carried out due to patient leaving prior to being seen by health care provider: Secondary | ICD-10-CM | POA: Insufficient documentation

## 2019-02-09 LAB — CBC
HCT: 43.9 % (ref 36.0–46.0)
Hemoglobin: 13.9 g/dL (ref 12.0–15.0)
MCH: 28.5 pg (ref 26.0–34.0)
MCHC: 31.7 g/dL (ref 30.0–36.0)
MCV: 90 fL (ref 80.0–100.0)
Platelets: 205 10*3/uL (ref 150–400)
RBC: 4.88 MIL/uL (ref 3.87–5.11)
RDW: 13.8 % (ref 11.5–15.5)
WBC: 6.7 10*3/uL (ref 4.0–10.5)
nRBC: 0 % (ref 0.0–0.2)

## 2019-02-09 LAB — I-STAT BETA HCG BLOOD, ED (MC, WL, AP ONLY): I-stat hCG, quantitative: <5 m[IU]/mL (ref <5)

## 2019-02-09 LAB — BASIC METABOLIC PANEL
Anion gap: 10 (ref 5–15)
BUN: 15 mg/dL (ref 6–20)
CO2: 23 mmol/L (ref 22–32)
Calcium: 9.4 mg/dL (ref 8.9–10.3)
Chloride: 105 mmol/L (ref 98–111)
Creatinine, Ser: 1.33 mg/dL — ABNORMAL HIGH (ref 0.44–1.00)
GFR calc Af Amer: 58 mL/min — ABNORMAL LOW (ref >60)
GFR calc non Af Amer: 50 mL/min — ABNORMAL LOW (ref >60)
Glucose, Bld: 91 mg/dL (ref 70–99)
Potassium: 4.4 mmol/L (ref 3.5–5.1)
Sodium: 138 mmol/L (ref 135–145)

## 2019-02-09 LAB — TROPONIN I (HIGH SENSITIVITY)
Troponin I (High Sensitivity): 6 ng/L (ref <18)
Troponin I (High Sensitivity): 5 ng/L (ref <18)

## 2019-02-09 IMAGING — DX DG CHEST 2V
2 series · 2 of 2 positions shown · non-contrast
Comparison: [DATE]

CLINICAL DATA: Chest pain and shortness of breath

EXAM:
CHEST - 2 VIEW

[chest pa]
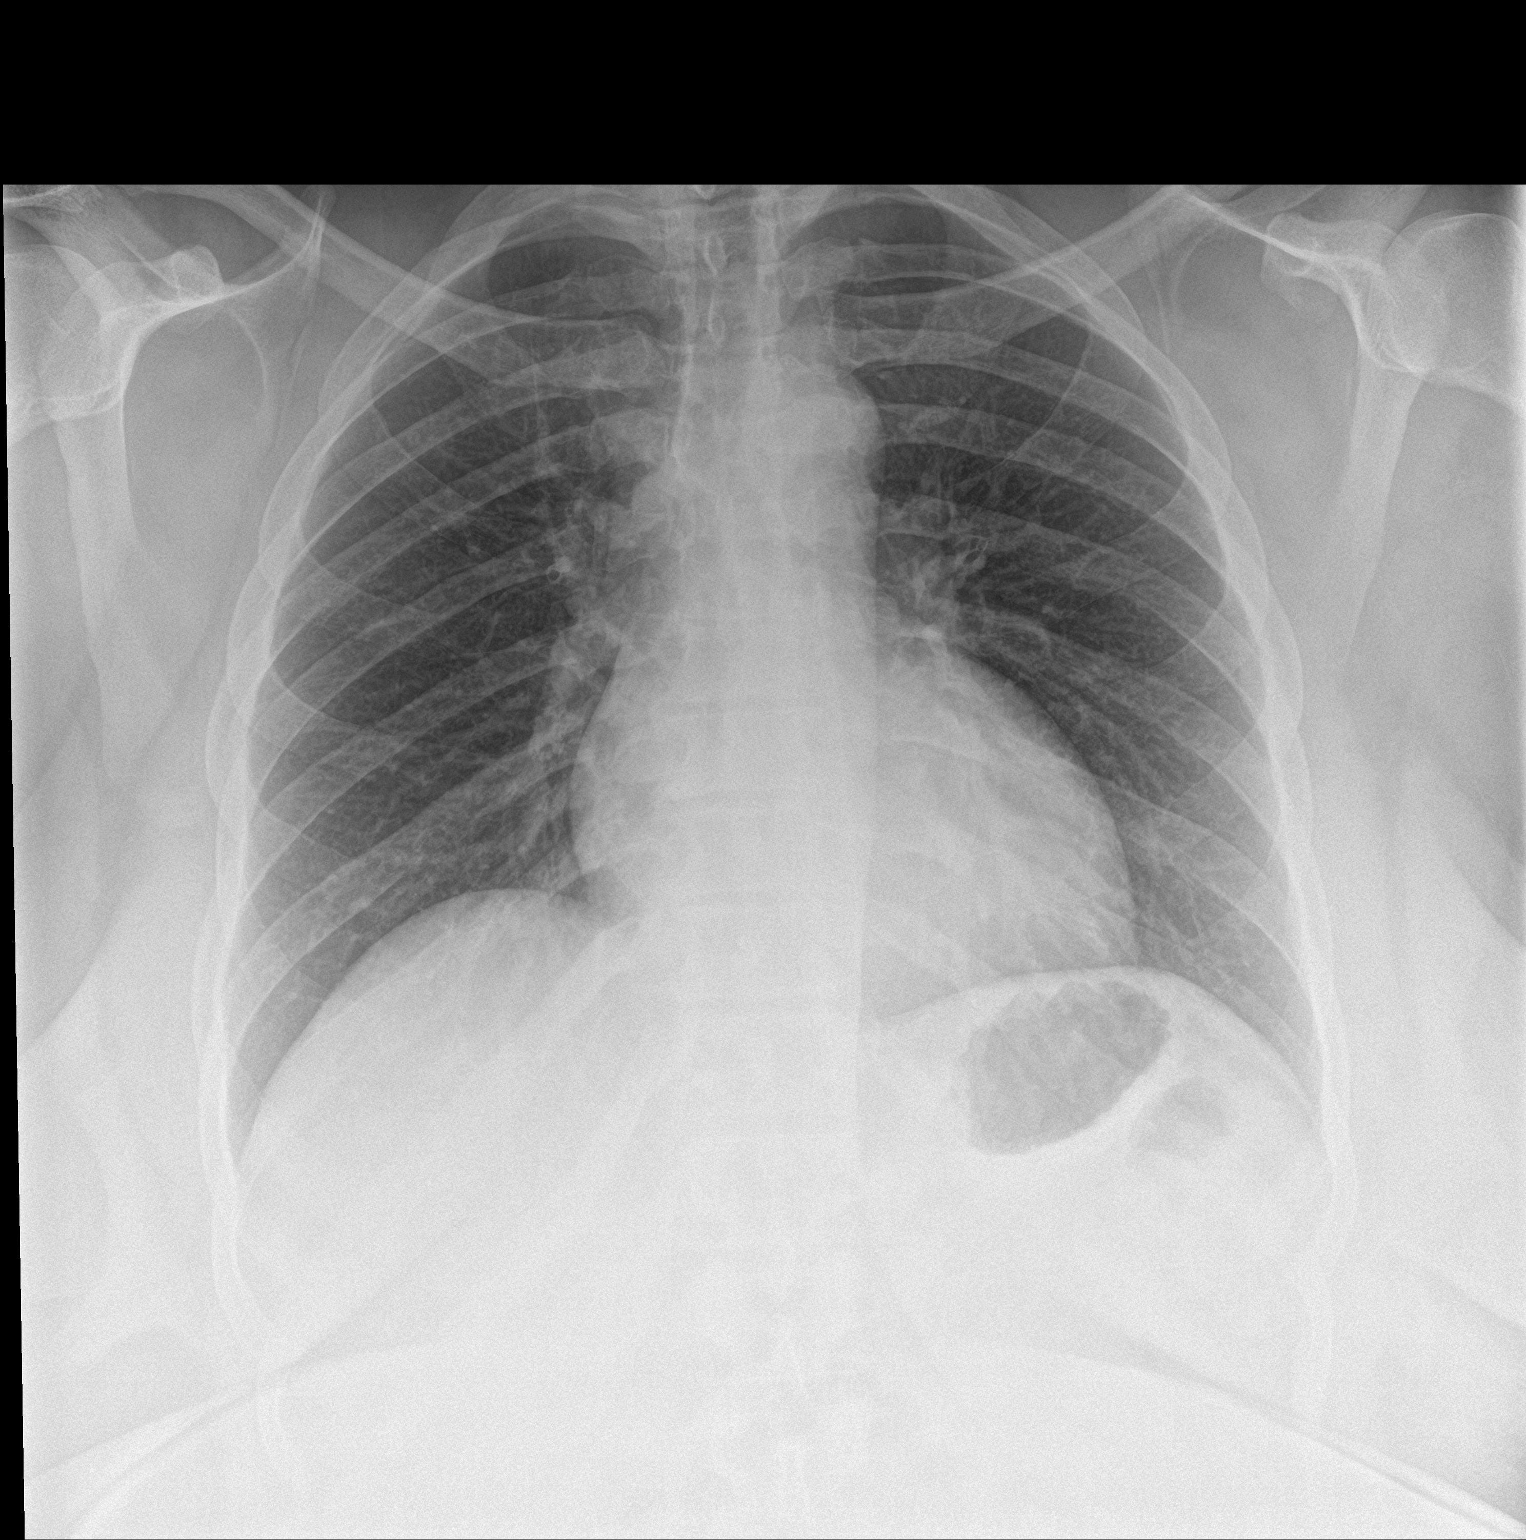

[chest lat]
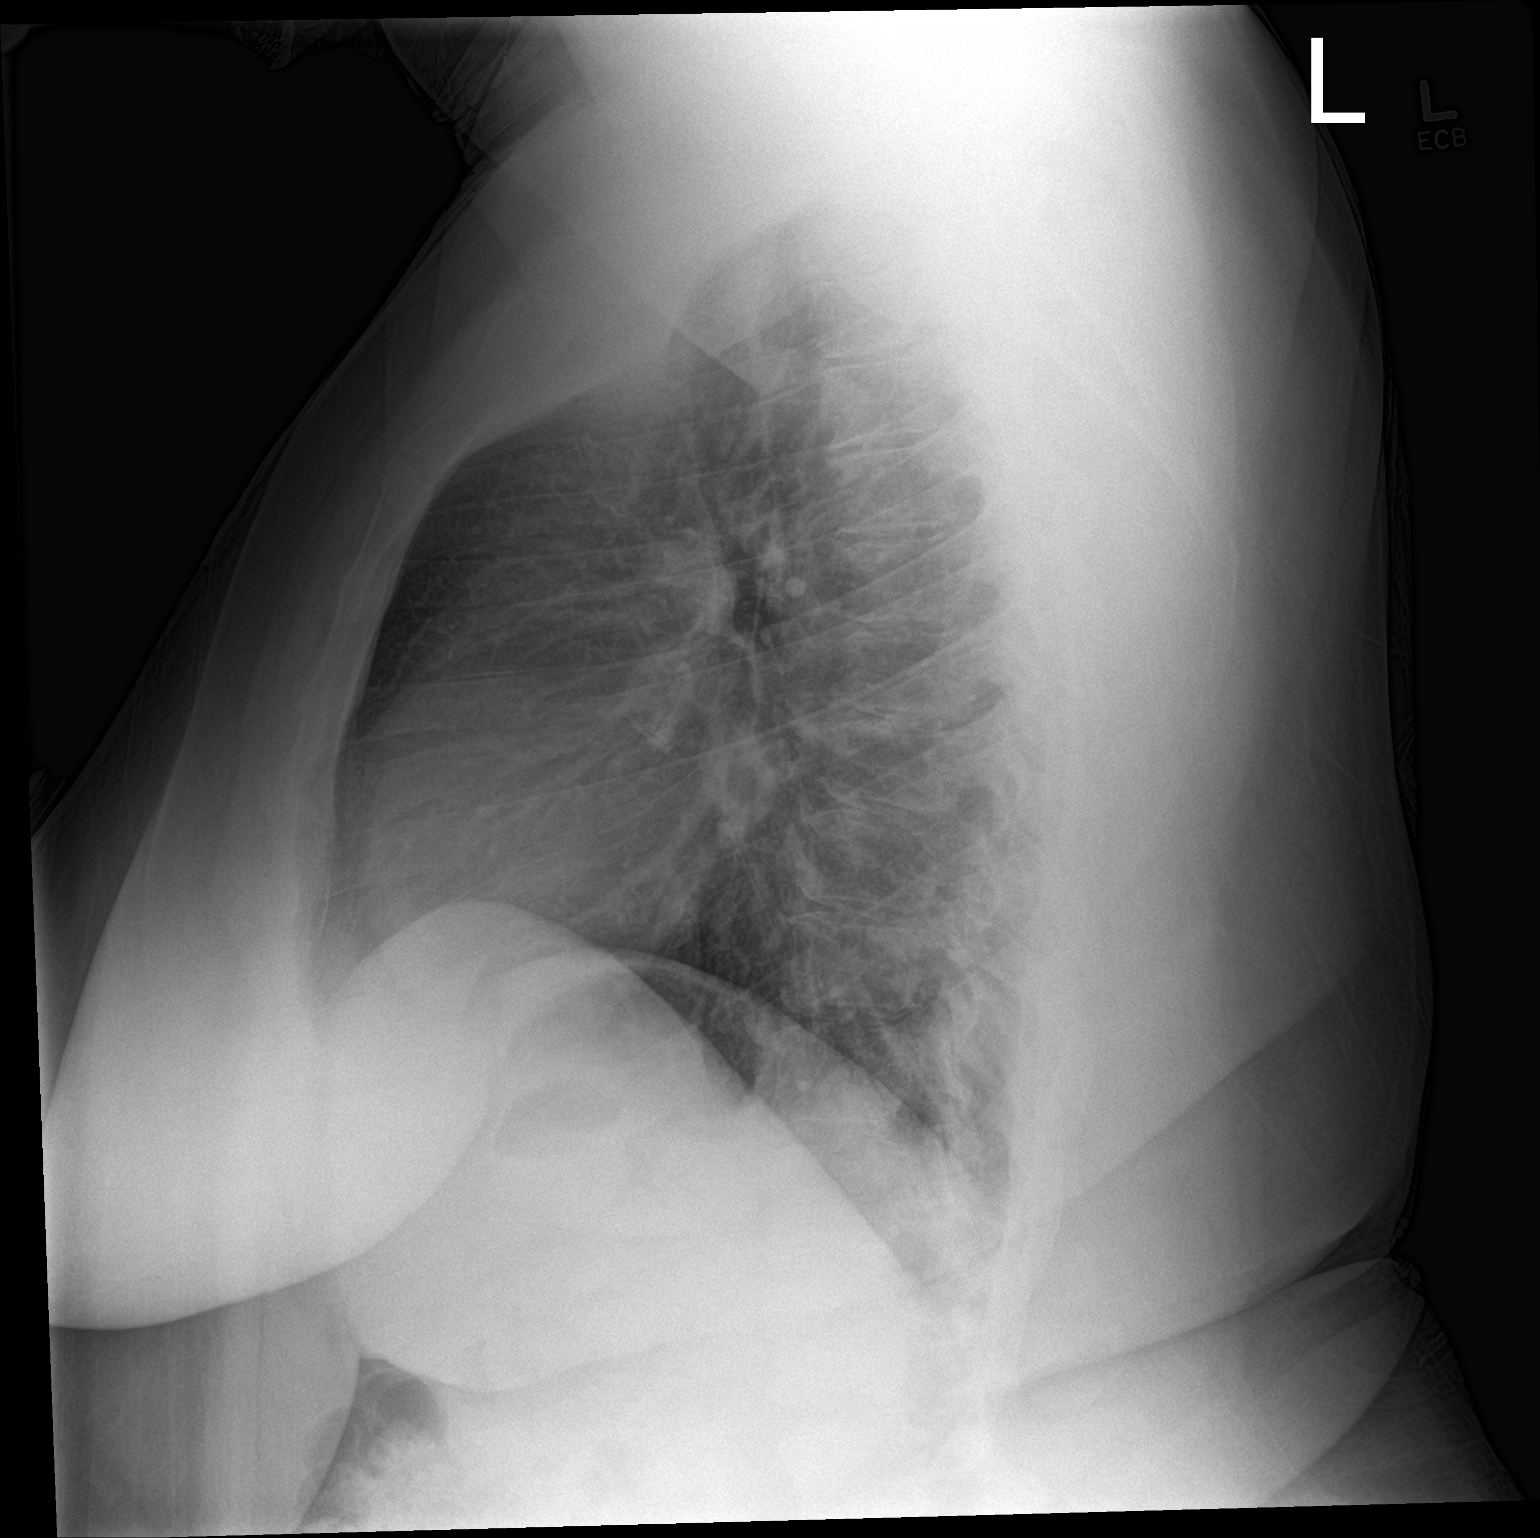

[2 of 2 positions shown; findings below may reference images not displayed]

FINDINGS: Lungs are clear. Heart size and pulmonary vascularity are normal. No
adenopathy. No pneumothorax. No bone lesions.
IMPRESSION: No edema or consolidation.

## 2019-02-09 MED ORDER — SODIUM CHLORIDE 0.9% FLUSH: 3.0000 mL | Freq: Once | INTRAVENOUS | Status: DC

## 2019-02-09 NOTE — ED Notes (Signed)
Pt stated she is leaving. 

## 2019-02-09 NOTE — ED Triage Notes (Signed)
Onset chest pain and shortness of breath today currently chest pain 8/10 dull pressure. States fatigue intermttent since 05/2018.

## 2019-02-26 ENCOUNTER — Ambulatory Visit: Payer: Self-pay | Admitting: Internal Medicine

## 2019-02-26 ENCOUNTER — Encounter: Payer: Self-pay | Admitting: Internal Medicine

## 2019-02-26 ENCOUNTER — Other Ambulatory Visit: Payer: Self-pay

## 2019-02-26 VITALS — BP 180/120 | HR 78 | Temp 97.2°F | Resp 12 | Ht 66.0 in | Wt 286.0 lb

## 2019-02-26 DIAGNOSIS — Z23 Encounter for immunization: Secondary | ICD-10-CM

## 2019-02-26 DIAGNOSIS — N181 Chronic kidney disease, stage 1: Secondary | ICD-10-CM

## 2019-02-26 DIAGNOSIS — B353 Tinea pedis: Secondary | ICD-10-CM | POA: Insufficient documentation

## 2019-02-26 DIAGNOSIS — B354 Tinea corporis: Secondary | ICD-10-CM

## 2019-02-26 DIAGNOSIS — Z79899 Other long term (current) drug therapy: Secondary | ICD-10-CM

## 2019-02-26 DIAGNOSIS — I10 Essential (primary) hypertension: Secondary | ICD-10-CM

## 2019-02-26 DIAGNOSIS — E282 Polycystic ovarian syndrome: Secondary | ICD-10-CM

## 2019-02-26 DIAGNOSIS — R7303 Prediabetes: Secondary | ICD-10-CM

## 2019-02-26 MED ORDER — TEA TREE 100 % EX OIL: TOPICAL_OIL | CUTANEOUS | Status: DC

## 2019-02-26 MED ORDER — TERBINAFINE HCL 250 MG PO TABS: ORAL_TABLET | ORAL | 1 refills | Status: DC

## 2019-02-26 NOTE — Progress Notes (Signed)
Subjective:    Patient ID: Donna Burns, female   DOB: [birth year], 38 y.o.   MRN: [id number]   HPI   Here to establish  1.  Elevated Creatinine:  Noted in Epic labs since 2014.  She has a history of hypertension when she was first treated in 2008.  States when she loses weight, her bp come down and then she can come off medication. Generally, was treated with Lisinopril when her bp was up. Last year treated with Metoprolol and then just before Covid19, started on Labetalol December 2019.   She has not been taking the Labetalol regularly and sometimes feels tired.  Forgets the second dose.  2.  Obesity/PCOS: Up at 5 a.m. Eats oatmeal Or breakfast smoothie Celery, Kale, berries, water. Lots of veggies. Soda 20 oz every 2 days. Has been on Metformin for PCOS but on and off with elevated creatinine.  A1C in 2008 was 6.8% 5.5% 2 months later.  3.  Skin issue:  Ring on skin that itches.  Has had for years.  Comes on seasonally.  Has been treated for ring worm, which seems to help, but comes back.     Current Meds  Medication Sig  . labetalol (NORMODYNE) 200 MG tablet Take 200 mg by mouth 2 (two) times daily.   Allergies  Allergen Reactions  . Pork-Derived Products Other (See Comments)    Pt does not eat pork or use pork-derived products   Past Medical History:  Diagnosis Date  . Bilateral polycystic ovarian syndrome   . HSV-2 (herpes simplex virus 2) infection   . Hypertension 2008  . Prediabetes     Past Surgical History:  Procedure Laterality Date  . KNEE SURGERY Right 2015   torn cartilage--arthroscopic  . MOUTH SURGERY  2016   9 teeth pulled for decay-upper incisors.    Family History  Problem Relation Age of Onset  . Heart failure Mother   . Diabetes Mother   . Hypertension Mother   . Stroke Father        Nursing home bound  . Hypertension Father   . Dementia Father        from strokes  . Other Sister        Prediabetes  . Hypertension Sister      Social History   Socioeconomic History  . Marital status: Married    Spouse name: [name]  . Number of children: 0  . Years of education: Not on file  . Highest education level: Bachelor's degree (e.g., BA, AB, BS)  Occupational History  . Not on file  Social Needs  . Financial resource strain: Not on file  . Food insecurity    Worry: Never true    Inability: Never true  . Transportation needs    Medical: No    Non-medical: No  Tobacco Use  . Smoking status: Never Smoker  . Smokeless tobacco: Never Used  Substance and Sexual Activity  . Alcohol use: No  . Drug use: No  . Sexual activity: Yes    Birth control/protection: None  Lifestyle  . Physical activity    Days per week: Not on file    Minutes per session: Not on file  . Stress: Not on file  Relationships  . Social Herbalist on phone: Not on file    Gets together: Not on file    Attends religious service: Not on file    Active member of club or organization: Not  on file    Attends meetings of clubs or organizations: Not on file    Relationship status: Not on file  . Intimate partner violence    Fear of current or ex partner: No    Emotionally abused: No    Physically abused: No    Forced sexual activity: No  Other Topics Concern  . Not on file  Social History Narrative   Lives with husband in [place].   He is a Education officer, environmental   She is a Forensic scientist.     Review of Systems    Objective:   BP (!) 180/120 (BP Location: Left Arm, Patient Position: Sitting, Cuff Size: Large)   Pulse 78   Temp (!) 97.2 F (36.2 C) (Temporal)   Resp 12   Ht 5\' 6"  (1.676 m)   Wt 286 lb (129.7 kg)   LMP 01/21/2019   BMI 46.16 kg/m   Physical Exam  HEENT:  PERRL, EOMI, Discs sharp, TMs pearly gray. Throat without injection Neck:  Supple, No adenopathy, no thyromegaly Chest:  CTA CV:  RRR with normal S1 and S2, No S3, S4 or murmur.  Carotid, radial, and DP pulses normal and  equal. Abd:  S, NT, No HSM or mass, + BS LE:  No edema. Skin:  Multiple small and large rings with central clearing and built up erythematous flaking edges, Mainly on bilateral thighs and lower legs.  One on right upper arm.   No lesions involving scalp Feet with erythema and fissuring in between toes and flaking along edges of circumference of feet.  Toenails without lesion.   Assessment & Plan   1.  Hypertension:  Labetalol 200 mg twice daily without missing.  BP and pulse check in 1 week.  2.  CKD: Recent Creatinine mildly elevated at 1.33.  Will see with control of hypertension if this improves.   Discussed at length need to control bp to protect kidney function.  3.  Obesity:  Discussed dietary changes and gradually improving physical activity.  4.  PCOS/prediabetes:  A1C.  Decision to hold on restarting Metformin until done with oral Terbinafine if A1C not particularly high.  5.  Tinea corporis and pedis/possibly some eczema of feet:  Terbinafine 250 mg daily for 30 days with 1 refill--recheck in 4 weeks before second fill with hepatic profile and to see if improved. Tea Tree oil mixed with Gold Bond Foot cream to feet nightly. Baseline hepatic profile.  6.  HM:  Tdap.  Encouraged influenza vaccination with next flu clinic.

## 2019-02-27 LAB — HEPATIC FUNCTION PANEL
ALT: 14 IU/L (ref 0–32)
AST: 21 IU/L (ref 0–40)
Albumin: 4 g/dL (ref 3.8–4.8)
Alkaline Phosphatase: 53 IU/L (ref 39–117)
Bilirubin Total: <0.2 mg/dL (ref 0.0–1.2)
Bilirubin, Direct: 0.08 mg/dL (ref 0.00–0.40)
Total Protein: 7.4 g/dL (ref 6.0–8.5)

## 2019-02-27 LAB — HGB A1C W/O EAG: Hgb A1c MFr Bld: 6.4 % — ABNORMAL HIGH (ref 4.8–5.6)

## 2019-03-01 ENCOUNTER — Other Ambulatory Visit: Payer: Self-pay

## 2019-03-01 ENCOUNTER — Encounter (HOSPITAL_COMMUNITY): Payer: Self-pay

## 2019-03-01 ENCOUNTER — Ambulatory Visit (HOSPITAL_COMMUNITY)
Admission: EM | Admit: 2019-03-01 | Discharge: 2019-03-01 | Disposition: A | Discharge status: Home / Self Care | Payer: Self-pay | Attending: Emergency Medicine | Admitting: Emergency Medicine

## 2019-03-01 DIAGNOSIS — Z3202 Encounter for pregnancy test, result negative: Secondary | ICD-10-CM

## 2019-03-01 DIAGNOSIS — M25531 Pain in right wrist: Secondary | ICD-10-CM

## 2019-03-01 DIAGNOSIS — I10 Essential (primary) hypertension: Secondary | ICD-10-CM

## 2019-03-01 LAB — POCT PREGNANCY, URINE: Preg Test, Ur: NEGATIVE

## 2019-03-01 MED ORDER — MUPIROCIN 2 % EX OINT: 1.0000 "application " | TOPICAL_OINTMENT | Freq: Three times a day (TID) | CUTANEOUS | 0 refills | Status: DC

## 2019-03-01 NOTE — ED Triage Notes (Signed)
Pt states she has a swollen wrist. Pt states this just started 1 hour ago.

## 2019-03-01 NOTE — Discharge Instructions (Addendum)
Your urine pregnancy was negative.  Can try icing your wrist for 20 minutes at a time.  Take 1 g of Tylenol 3-4 times a day as needed for pain.  You can try the Bactroban in case it starts to look infected.  Continue to keep a close eye on your blood pressure.  Decrease your salt intake. diet and exercise will lower your blood pressure significantly. It is important to keep your blood pressure under good control, as having a elevated blood pressure for prolonged periods of time significantly increases your risk of stroke, heart attacks, kidney damage, eye damage, and other problems. Measure your blood pressure once a day, preferably at the same time every day. Keep a log of this and bring it to your next doctor's appointment.  Bring your blood pressure cuff as well.   Return immediately to the ER if you start having chest pain, headache, problems seeing, problems talking, problems walking, if you feel like you're about to pass out, if you do pass out, if you have a seizure, or for any other concerns.  Go to www.goodrx.com to look up your medications. This will give you a list of where you can find your prescriptions at the most affordable prices. Or ask the pharmacist what the cash price is, or if they have any other discount programs available to help make your medication more affordable. This can be less expensive than what you would pay with insurance.

## 2019-03-01 NOTE — ED Provider Notes (Signed)
HPI  SUBJECTIVE:  Donna Burns is a right handed 38 y.o. female who presents with right wrist pain and swelling starting an hour prior to evaluation.  She denies trauma, repetitive activity, change in her physical activity.  No erythema.  No distal numbness or tingling, grip weakness.  No bruising, deformity.  No aggravating or alleviating factors.  She has not tried anything for this.  Past medical history of hypertension, prediabetes.  She is not a smoker.  She is concerned about a possible spider bite.  states she has a history of a brown recluse bite that required hospitalization.  [facility]:ENIDPOEU, Lanora Manis, MD   Past Medical History:  Diagnosis Date  . Bilateral polycystic ovarian syndrome   . HSV-2 (herpes simplex virus 2) infection   . Hypertension 2008  . Prediabetes     Past Surgical History:  Procedure Laterality Date  . KNEE SURGERY Right 2015   torn cartilage--arthroscopic  . MOUTH SURGERY  2016   9 teeth pulled for decay-upper incisors.    Family History  Problem Relation Age of Onset  . Heart failure Mother   . Diabetes Mother   . Hypertension Mother   . Stroke Father        Nursing home bound  . Hypertension Father   . Dementia Father        from strokes  . Other Sister        Prediabetes  . Hypertension Sister     Social History   Tobacco Use  . Smoking status: Never Smoker  . Smokeless tobacco: Never Used  Substance Use Topics  . Alcohol use: No  . Drug use: No    No current facility-administered medications for this encounter.   Current Outpatient Medications:  .  acyclovir (ZOVIRAX) 800 MG tablet, Take 800 mg by mouth daily as needed (for HSV 2). , Disp: , Rfl:  .  labetalol (NORMODYNE) 200 MG tablet, Take 200 mg by mouth 2 (two) times daily., Disp: , Rfl:  .  metFORMIN (GLUCOPHAGE) 500 MG tablet, Take 500 mg by mouth daily at 12 noon. , Disp: , Rfl:  .  mupirocin ointment (BACTROBAN) 2 %, Apply 1 application topically 3 (three) times daily.,  Disp: 22 g, Rfl: 0 .  Tea Tree 100 % OIL, Mix with Gold Bond Foot cream and apply all over dry flaky irritated areas of feet daily, Disp:  , Rfl:  .  terbinafine (LAMISIL) 250 MG tablet, 1 tab by mouth daily for 30 days., Disp: 30 tablet, Rfl: 1  Allergies  Allergen Reactions  . Pork-Derived Products Other (See Comments)    Pt does not eat pork or use pork-derived products     ROS  As noted in HPI.   Physical Exam  BP (!) 169/116 (BP Location: Right Arm)   Pulse 84   Temp 98.6 F (37 C)   Resp 18   Wt 129.3 kg   LMP 01/21/2019   SpO2 99%   BMI 46.00 kg/m   Constitutional: Well developed, well nourished, no acute distress Eyes:  EOMI, conjunctiva normal bilaterally HENT: Normocephalic, atraumatic,mucus membranes moist Respiratory: Normal inspiratory effort Cardiovascular: Normal rate GI: nondistended skin: No rash, skin intact Musculoskeletal: R distal radius, dorsum of wrist mildly tender, no swelling, erythema.  Skin intact.  Distal ulnar styloid NT, snuffbox NT, carpals NT , metacarpals NT, digits NT, TFCC NT.  no pain with supination, no pain with pronation,  no pain with radial / ulnar deviation. Motor intact ability to  flex / extend digits, Sensation LT to hand normal, RP 2+, CR<2 seconds distally.  [place] neg.   Elbow and proximal forearm NT. Neurologic: Alert & oriented x 3, no focal neuro deficits Psychiatric: Speech and behavior appropriate   ED Course   Medications - No data to display  Orders Placed This Encounter  Procedures  . POC urine pregnancy    Standing Status:   Standing    Number of Occurrences:   1  . Pregnancy, urine POC    Standing Status:   Standing    Number of Occurrences:   1    Results for orders placed or performed during the hospital encounter of 03/01/19 (from the past 24 hour(s))  Pregnancy, urine POC     Status: None   Collection Time: 03/01/19  1:10 PM  Result Value Ref Range   Preg Test, Ur NEGATIVE NEGATIVE   No  results found.  ED Clinical Impression  1. Acute pain of right wrist   2. Essential hypertension      ED Assessment/Plan  Blood pressure noted.  Patient states that her blood pressure normally runs in the 160s to 110's.  Last 3 visits has run 160's-180/115-120's.  she is  asymptomatic.  Denies headache, blurry vision, strokelike symptoms, chest pain, shortness of breath, pain tearing through to her back, abdominal pain, seizures, syncope, lower extremity swelling, hematuria, anuria.  She states that she is working on her blood pressure closely with her primary care physician, she has follow-up with her on Friday.  She is monitoring her BP at home.  Give her warning signs of hypertensive emergency.  Checking urine pregnancy per patient request.  Patient has some bony tenderness along the distal radius, but the skin is intact, there is no sign of trauma.  She has no history of trauma, do not think that imaging would be helpful.  Possible ganglion cyst?  She has no pain with PROM of the wrist.  Unsure as to the cause of her symptoms, especially since it started so recently, will send her home with Tylenol, ice, Bactroban in case it starts to look infected given her history of a spider bite.   Urine pregnancy negative.  Discussed MDM, treatment plan, and plan for follow-up with patient. Discussed sn/sx that should prompt return to the ED. patient agrees with plan.   Meds ordered this encounter  Medications  . mupirocin ointment (BACTROBAN) 2 %    Sig: Apply 1 application topically 3 (three) times daily.    Dispense:  22 g    Refill:  0    *This clinic note was created using [employer]. Therefore, there may be occasional mistakes despite careful proofreading.   ?   Melynda Ripple, MD 03/02/19 1228

## 2019-03-05 ENCOUNTER — Other Ambulatory Visit: Payer: Self-pay

## 2019-03-05 ENCOUNTER — Ambulatory Visit (INDEPENDENT_AMBULATORY_CARE_PROVIDER_SITE_OTHER): Payer: Self-pay

## 2019-03-05 VITALS — BP 166/110 | HR 74

## 2019-03-05 DIAGNOSIS — I10 Essential (primary) hypertension: Secondary | ICD-10-CM

## 2019-03-05 NOTE — Progress Notes (Signed)
Patient BP still running high on Labetalol 200 mg 1 twice a day. Per Dr. Amil Amen have patient increase medication to 11/2 tablets twice a day and call in with BP readings in 1 week. Left detailed message for patient and asked for a return call to confirm receipt of message.

## 2019-03-19 ENCOUNTER — Telehealth: Payer: Self-pay | Admitting: Internal Medicine

## 2019-03-19 MED ORDER — LABETALOL HCL 200 MG PO TABS: 200.0000 mg | ORAL_TABLET | Freq: Two times a day (BID) | ORAL | 11 refills | Status: DC

## 2019-03-19 NOTE — Telephone Encounter (Signed)
Rx sent to pharmacy. To Dr. Amil Amen for [facility]

## 2019-03-19 NOTE — Telephone Encounter (Signed)
Patient called to provided BP reading for today. BP was 157/99 with pulse at 72. Patient also stated when to pick up labetalol (NORMODYNE) 200 MG tablet and was told was nothing available for her.  Please advise.

## 2019-03-29 ENCOUNTER — Other Ambulatory Visit: Payer: Self-pay

## 2019-03-29 ENCOUNTER — Encounter: Payer: Self-pay | Admitting: Internal Medicine

## 2019-03-29 ENCOUNTER — Ambulatory Visit: Payer: Self-pay | Admitting: Internal Medicine

## 2019-03-29 VITALS — BP 152/110 | HR 72 | Resp 14 | Ht 66.0 in | Wt 280.0 lb

## 2019-03-29 DIAGNOSIS — N181 Chronic kidney disease, stage 1: Secondary | ICD-10-CM

## 2019-03-29 DIAGNOSIS — B353 Tinea pedis: Secondary | ICD-10-CM

## 2019-03-29 DIAGNOSIS — B354 Tinea corporis: Secondary | ICD-10-CM

## 2019-03-29 DIAGNOSIS — R7303 Prediabetes: Secondary | ICD-10-CM

## 2019-03-29 DIAGNOSIS — L309 Dermatitis, unspecified: Secondary | ICD-10-CM

## 2019-03-29 DIAGNOSIS — I10 Essential (primary) hypertension: Secondary | ICD-10-CM

## 2019-03-29 MED ORDER — TRIAMCINOLONE ACETONIDE 0.1 % EX CREA: TOPICAL_CREAM | CUTANEOUS | 0 refills | Status: DC

## 2019-03-29 MED ORDER — LABETALOL HCL 200 MG PO TABS: ORAL_TABLET | ORAL | 11 refills | Status: DC

## 2019-03-29 MED ORDER — TERBINAFINE HCL 250 MG PO TABS: ORAL_TABLET | ORAL | 0 refills | Status: DC

## 2019-03-29 NOTE — Progress Notes (Signed)
    Subjective:    Patient ID: Donna Burns, female   DOB: 11/17/1980, 38 y.o.   MRN: [id number]   HPI   1.  Hypertension:  At nurse visit follow up, bp was improved, but not at goal, so increased to 300 mg of Labetalol twice daily.  States she is not missing.  Tolerating this dosing fine.  She is contemplating possible pregnancy once her bp and health improved, so working with Labetalol.  2.  Skin lesions/foot rash:  Felt to be tinea.  Finished 30 days of oral Terbinafine 250 mg a couple of days ago.  The lesions on her legs and arms are significantly improved, but she states they do this every year as the weather gets cooler.   Her feet are better as well, especially in between toes. She does have a history of significant eczema as a child, but seemed to resolve as she grew older.  3.  CKD:  Discussed would like to see her bp be in a normal range for a while before rechecking Creatinine.  4.  PCOS/obesity/prediabetes:  She has lost weight with lifestyle changes.  She would like to hold on the Metformin until she is done with the Terbinafine.  A1C was 6.4% on 02/26/2019 when established.  Current Meds  Medication Sig  . labetalol (NORMODYNE) 200 MG tablet Take 1 tablet (200 mg total) by mouth 2 (two) times daily. (Patient taking differently: Take 200 mg by mouth. 1 1/2 tablets twice a day)  . mupirocin ointment (BACTROBAN) 2 % Apply 1 application topically 3 (three) times daily.  . Tea Tree 100 % OIL Mix with Gold Bond Foot cream and apply all over dry flaky irritated areas of feet daily   Allergies  Allergen Reactions  . Pork-Derived Products Other (See Comments)    Pt does not eat pork or use pork-derived products     Review of Systems    Objective:   BP (!) 152/110 (BP Location: Left Arm, Cuff Size: Large)   Pulse 72   Resp 14   Ht 5\' 6"  (1.676 m)   Wt 280 lb (127 kg)   LMP 01/15/2019 (Approximate)   BMI 45.19 kg/m   Physical Exam   NAD Lungs:  CTA CV:  RRR  without murmur or rub.  Radial and DP pulses normal and equal LE:  No edema Skin:  Ringed lesions now with a very thin and fine white edge of dry skin and mild hyperpigmentation within circle, but the elevated flaking on erythema edge has resolved. Feet with mild fissuring between 4th and 5th digits of left foot.  Mild flaking of plantar foot.  Still with erythematous patches of flaking along feet edges bilaterally.  Decreased from before.   Assessment & Plan   1.  Hypertension/CKD:  Increase Labetalol to 400 mg twice daily.  Follow up in 2 months.  CmP  2.  Tinea/eczema  Skin other than feet appears to be essentially or almost resolved.  For feet, will treat another 45 days of oral Terbinafine.  Triamcinolone cream twice daily to edges of feet where still with patches to see if some of this is more eczematous.  Not to place cortisone cream in between toes. Continue with Tea tree oil/foot cream once daily.  3.  PCOS/obesity/prediabetes:  Hold on starting Metformin.  She continues to work on lifestyle changes.

## 2019-03-30 LAB — COMPREHENSIVE METABOLIC PANEL
ALT: 16 IU/L (ref 0–32)
AST: 16 IU/L (ref 0–40)
Albumin/Globulin Ratio: 1.2 (ref 1.2–2.2)
Albumin: 4 g/dL (ref 3.8–4.8)
Alkaline Phosphatase: 49 IU/L (ref 39–117)
BUN/Creatinine Ratio: 11 (ref 9–23)
BUN: 17 mg/dL (ref 6–20)
Bilirubin Total: 0.2 mg/dL (ref 0.0–1.2)
CO2: 22 mmol/L (ref 20–29)
Calcium: 9.5 mg/dL (ref 8.7–10.2)
Chloride: 105 mmol/L (ref 96–106)
Creatinine, Ser: 1.51 mg/dL — ABNORMAL HIGH (ref 0.57–1.00)
GFR calc Af Amer: 50 mL/min/{1.73_m2} — ABNORMAL LOW (ref >59)
GFR calc non Af Amer: 43 mL/min/{1.73_m2} — ABNORMAL LOW (ref >59)
Globulin, Total: 3.3 g/dL (ref 1.5–4.5)
Glucose: 102 mg/dL — ABNORMAL HIGH (ref 65–99)
Potassium: 4.3 mmol/L (ref 3.5–5.2)
Sodium: 140 mmol/L (ref 134–144)
Total Protein: 7.3 g/dL (ref 6.0–8.5)

## 2019-06-01 ENCOUNTER — Other Ambulatory Visit: Payer: Self-pay

## 2019-06-01 ENCOUNTER — Ambulatory Visit: Payer: Self-pay | Admitting: Internal Medicine

## 2019-06-01 ENCOUNTER — Encounter: Payer: Self-pay | Admitting: Internal Medicine

## 2019-06-01 VITALS — BP 180/122 | HR 74 | Resp 12 | Ht 66.0 in | Wt 284.0 lb

## 2019-06-01 DIAGNOSIS — G8929 Other chronic pain: Secondary | ICD-10-CM

## 2019-06-01 DIAGNOSIS — N181 Chronic kidney disease, stage 1: Secondary | ICD-10-CM

## 2019-06-01 DIAGNOSIS — M5442 Lumbago with sciatica, left side: Secondary | ICD-10-CM

## 2019-06-01 DIAGNOSIS — R7303 Prediabetes: Secondary | ICD-10-CM

## 2019-06-01 DIAGNOSIS — E282 Polycystic ovarian syndrome: Secondary | ICD-10-CM

## 2019-06-01 DIAGNOSIS — L309 Dermatitis, unspecified: Secondary | ICD-10-CM

## 2019-06-01 DIAGNOSIS — I10 Essential (primary) hypertension: Secondary | ICD-10-CM

## 2019-06-01 MED ORDER — ACYCLOVIR 400 MG PO TABS: ORAL_TABLET | ORAL | 0 refills | Status: DC

## 2019-06-01 MED ORDER — TRIAMCINOLONE ACETONIDE 0.1 % EX CREA: TOPICAL_CREAM | CUTANEOUS | 1 refills | Status: DC

## 2019-06-01 MED ORDER — NIFEDIPINE ER OSMOTIC RELEASE 30 MG PO TB24: 30.0000 mg | ORAL_TABLET | Freq: Every day | ORAL | 11 refills | Status: DC

## 2019-06-01 MED ORDER — METFORMIN HCL ER 500 MG PO TB24: 500.0000 mg | ORAL_TABLET | Freq: Every day | ORAL | 11 refills | Status: DC

## 2019-06-01 NOTE — Progress Notes (Signed)
Subjective:    Patient ID: Donna Burns, female   DOB: [birth year], 39 y.o.   MRN: [id number]   HPI   1.  Hypertension:  Was increased to a total of 400 mg twice daily of Labetalol at visit in November.  Missing her medications maybe 4 times in past month.  Last was 1 week ago. Also with mildly elevated Creatinine/CKD Has been on Labetalol and Amlodipine combination in the past and worked well for her.  Cannot promise to use condoms regularly and would like to get pregnant, though does have PCOS.   She would like to go back to Lisinopril, but discussed concerns for fetus with ACEs.     2.  Foot flaking/itching/likely eczema:  Her use of Triamcinolone cream worked the best to control this.  Was using 3 times daily.  2 weeks ago, she started using less frequently as running out.  Previously also treated with Terbinafine, but not clear that helped her foot lesion issue.  3.  Prediabetes and PCOS:  Would like to get back on Metformin.  Last A1C was 6.4%.  Describes eating a better diet.   Wants to go on a very restrictive diet, but attempted to counsel her to follow a healthy diet she can maintain for life. Was on Metformin once or twice daily in past.  Prefers extended release.  4.  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  Has been more stressed and feels this may be coming on.  Would like to have some as needed if has a flare.  Her husband also has genital herpes.    5.  Low back pain:  Previously seen in [place] at [facility] ([facility] in 03/2018.  Symptoms with pain radiation into left lateral thigh and groin as well as posterior left leg. Xrays at the time showed mild DDD and mild degenerative changes of both hips   Pain responded to short course of prednisone at the time. Was to go to PT, but could never get a time that worked.   Patient states was to have an MR of L/S spine, but I am not able to find that recommendation in ortho note--Care Everywhere.  She would like to be seen  by ortho for this.   Current Meds  Medication Sig  . labetalol (NORMODYNE) 200 MG tablet 2 tabs by mouth twice daily.  . Podiatric Products (GOLD BOND FOOT) CREA Apply topically. As directed  . Tea Tree 100 % OIL Mix with Gold Bond Foot cream and apply all over dry flaky irritated areas of feet daily   Allergies  Allergen Reactions  . Pork-Derived Products Other (See Comments)    Pt does not eat pork or use pork-derived products     Review of Systems    Objective:   BP (!) 180/122 (BP Location: Left Arm, Patient Position: Sitting, Cuff Size: Large)   Pulse 74   Resp 12   Ht 5\' 6"  (1.676 m)   Wt 284 lb (128.8 kg)   LMP  (LMP Unknown)   BMI 45.84 kg/m   Physical Exam  NAD Lungs:  CTA CV:  RRR with normal S1 and S2, No S3, S4 or murmur.  Radial and DP pulses normal and equal. LE:  Trace edema Feet:  Flaking again along edges of feet, particularly mid foot to heel area.  MS/Neuro:  NT over spinous processes.  Mild tenderness over paraspinous musculature, L/S area.  Motor 5/5,  DTRs 2+/4.  Gait normal.  Assessment & Plan  1.  Hypertension:  As trying to get pregnant, change amlodipine to Nifedipine XL 30 mg daily with continued Labetalol 400 mg twice daily.   BP and pulse check in 1 week  2.  CKD:  BMP  3.  Eczema of feet:  Triamcinolone Cream twice daily as needed.  4.  Prediabetes/Obesity/PCOS:  Metformin XR 500 mg daily.  5.  HSV:  Acyclovir 400 mg 3 times daily for 5 days as needed for flare.  Discussed suppression, but not interested.  6.  Chronic low back pain:  [facility] referral.  Records in care everywhere.  To avoid NSAIDS with CKD.  Careful use of corticosteroids with prediabetes. Ortho at [facility]

## 2019-06-02 LAB — BASIC METABOLIC PANEL
BUN/Creatinine Ratio: 12 (ref 9–23)
BUN: 16 mg/dL (ref 6–20)
CO2: 25 mmol/L (ref 20–29)
Calcium: 9.7 mg/dL (ref 8.7–10.2)
Chloride: 104 mmol/L (ref 96–106)
Creatinine, Ser: 1.29 mg/dL — ABNORMAL HIGH (ref 0.57–1.00)
GFR calc Af Amer: 60 mL/min/{1.73_m2} (ref >59)
GFR calc non Af Amer: 52 mL/min/{1.73_m2} — ABNORMAL LOW (ref >59)
Glucose: 94 mg/dL (ref 65–99)
Potassium: 4.7 mmol/L (ref 3.5–5.2)
Sodium: 139 mmol/L (ref 134–144)

## 2019-06-15 ENCOUNTER — Other Ambulatory Visit: Payer: Self-pay

## 2019-06-15 ENCOUNTER — Ambulatory Visit (INDEPENDENT_AMBULATORY_CARE_PROVIDER_SITE_OTHER): Payer: Self-pay

## 2019-06-15 VITALS — BP 138/92 | HR 74

## 2019-06-15 DIAGNOSIS — I10 Essential (primary) hypertension: Secondary | ICD-10-CM

## 2019-06-15 NOTE — Progress Notes (Signed)
Patient BP has come down a lot after adding Nifedipine 30 mg once a day but still not at goal. Per Dr. Delrae Alfred have patient come back in one month for repeat BP check to see if BP has come down more and if so will consider taking patient off labetalol and just keeping her on Nefedipine. Patient verbalized understanding.

## 2019-06-22 ENCOUNTER — Other Ambulatory Visit: Payer: Self-pay

## 2019-06-22 ENCOUNTER — Ambulatory Visit: Payer: Self-pay | Admitting: Family Medicine

## 2019-06-22 ENCOUNTER — Encounter: Payer: Self-pay | Admitting: Orthopaedic Surgery

## 2019-06-22 ENCOUNTER — Ambulatory Visit (INDEPENDENT_AMBULATORY_CARE_PROVIDER_SITE_OTHER): Payer: Self-pay | Admitting: Orthopaedic Surgery

## 2019-06-22 DIAGNOSIS — M5416 Radiculopathy, lumbar region: Secondary | ICD-10-CM

## 2019-06-22 MED ORDER — PREDNISONE 10 MG (21) PO TBPK: ORAL_TABLET | ORAL | 0 refills | Status: DC

## 2019-06-22 MED ORDER — METHOCARBAMOL 500 MG PO TABS: 500.0000 mg | ORAL_TABLET | Freq: Two times a day (BID) | ORAL | 0 refills | Status: DC | PRN

## 2019-06-22 NOTE — Progress Notes (Signed)
Office Visit Note   Patient: Deliliah Spranger           Date of Birth: [birth year]           MRN: [id number] Visit Date: 06/22/2019              Requested by: Julieanne Manson, MD [address] [place],  [place] [address] PCP: Julieanne Manson, MD   Assessment & Plan: Visit Diagnoses:  1. Radiculopathy, lumbar region     Plan: Impression is chronic bilateral lower back pain with bilateral lumbar radiculopathy left greater than right.  I will start the patient on a steroid taper and muscle relaxer.  We will also go ahead and obtain an MRI of the lumbar spine.  She will follow up with [place] once has been completed.  Follow-Up Instructions: Return for f/u after MRI.   Orders:  No orders of the defined types were placed in this encounter.  Meds ordered this encounter  Medications  . methocarbamol (ROBAXIN) 500 MG tablet    Sig: Take 1 tablet (500 mg total) by mouth 2 (two) times daily as needed.    Dispense:  20 tablet    Refill:  0  . predniSONE (STERAPRED UNI-PAK 21 TAB) 10 MG (21) TBPK tablet    Sig: Take as directed    Dispense:  21 tablet    Refill:  0      Procedures: No procedures performed   Clinical Data: No additional findings.   Subjective: Chief Complaint  Patient presents with  . Lower Back - Pain    HPI patient is a pleasant 39 year old female who comes in today with chronic bilateral lower back pain and bilateral lower extremity radiculopathy left greater than right.  She noticed this initially about a year and a half ago when doing sit ups.  She was seen by an orthopedist at that point where x-rays of the hips and back were obtained.  Mild degenerative changes throughout.  She was sent to physical therapy but did not go due to the onset of the Covid pandemic.  She has however been working on a home exercise program.  She has tried a steroid taper without relief of symptoms.  At today's visit, she is having pain primarily to both lower extremities left  greater than right.  The pain begins in her lower back and radiates down the back of her legs into the calf.  Pain is worse going from a seated to standing position as well as when she is praying as she does this quite frequently.  She has been taking Tylenol without relief of symptoms.  Visit note occasional numbness and tingling to both feet.  No previous ESI or lumbar MRI.  Review of Systems as detailed in HPI.  All others reviewed and are negative.   Objective: Vital Signs: LMP  (LMP Unknown)   Physical Exam well-developed well-nourished female no acute distress.  Alert oriented x3.  Ortho Exam examination of her lumbar spine with no spinous or paraspinous tenderness.  No pain with lumbar flexion or extension.  She has a positive straight leg raise on the left.  Minimally positive straight leg raise on the right.  No focal weakness.  Minimally positive logroll on the left negative on the right.  She is neurovascular intact distally.  Specialty Comments:  No specialty comments available.  Imaging: No new imaging   PMFS History: Patient Active Problem List   Diagnosis Date Noted  . Stage 1 chronic kidney  disease 02/26/2019  . Tinea pedis of both feet 02/26/2019  . Tinea corporis 02/26/2019  . Prediabetes   . VAGINAL DISCHARGE 04/13/2010  . ANEMIA 10/27/2008  . VAGINITIS, BACTERIAL 10/27/2008  . Essential hypertension 06/21/2008  . LEG CRAMPS 06/21/2008  . ENDOMETRIAL HYPERPLASIA UNSPECIFIED 01/18/2008  . Morbid obesity ([facility]) 10/02/2007  . ECZEMA 10/02/2007  . 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 DENTAL CARIES 08/19/2007  . PCOS (polycystic ovarian syndrome) 05/27/1997   Past Medical History:  Diagnosis Date  . Bilateral polycystic ovarian syndrome   . HSV-2 (herpes simplex virus 2) infection   . Hypertension 2008  . Prediabetes     Family History  Problem Relation Age of Onset  . Heart failure Mother   . Diabetes Mother   . Hypertension Mother   . Stroke Father         Nursing home bound  . Hypertension Father   . Dementia Father        from strokes  . Other Sister        Prediabetes  . Hypertension Sister     Past Surgical History:  Procedure Laterality Date  . KNEE SURGERY Right 2015   torn cartilage--arthroscopic  . MOUTH SURGERY  2016   9 teeth pulled for decay-upper incisors.   Social History   Occupational History  . Not on file  Tobacco Use  . Smoking status: Never Smoker  . Smokeless tobacco: Never Used  Substance and Sexual Activity  . Alcohol use: No  . Drug use: No  . Sexual activity: Yes    Birth control/protection: None

## 2019-08-10 ENCOUNTER — Other Ambulatory Visit: Payer: Self-pay

## 2019-08-10 ENCOUNTER — Ambulatory Visit (INDEPENDENT_AMBULATORY_CARE_PROVIDER_SITE_OTHER): Payer: Self-pay

## 2019-08-10 VITALS — BP 138/88 | HR 78

## 2019-08-10 DIAGNOSIS — I10 Essential (primary) hypertension: Secondary | ICD-10-CM

## 2019-08-10 NOTE — Progress Notes (Signed)
Patient came in for BP and pulse check. Patient BP in normal range but high normal. Patient has follow up with Dr. Delrae Alfred on 08/31/19. Informed to keep appointment for that day and will see if BP has gone down more by then. Patient verbalized understanding and did say she had missed some doses of her Nifedipine. Patient verbalized understanding of taking medication daily and not missing any doses.

## 2019-08-12 ENCOUNTER — Ambulatory Visit
Admission: RE | Admit: 2019-08-12 | Discharge: 2019-08-12 | Disposition: A | Discharge status: Home / Self Care | Payer: Self-pay | Source: Ambulatory Visit | Attending: Orthopaedic Surgery | Admitting: Orthopaedic Surgery

## 2019-08-12 ENCOUNTER — Other Ambulatory Visit: Payer: Self-pay

## 2019-08-12 DIAGNOSIS — M5416 Radiculopathy, lumbar region: Secondary | ICD-10-CM

## 2019-08-12 IMAGING — MR MR LUMBAR SPINE W/O CM
4 of 5 series · 24 of 48 positions shown · non-contrast
Comparison: None.

CLINICAL DATA: Chronic bilateral low back pain

EXAM:
MRI LUMBAR SPINE WITHOUT CONTRAST
TECHNIQUE: Multiplanar, multisequence MR imaging of the lumbar spine was
performed. No intravenous contrast was administered.

[Series 6: T2 · sagittal · 4.0mm · 0.73mm/px · 5 of 13 slices shown (1 of 2)]
[im 1/13]
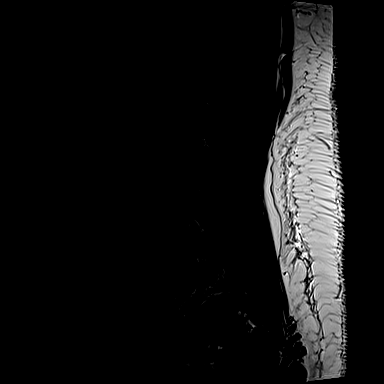
[im 4/13]
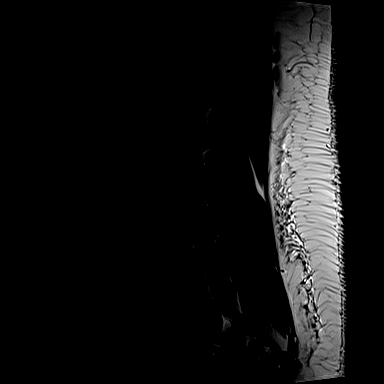
[im 7/13]
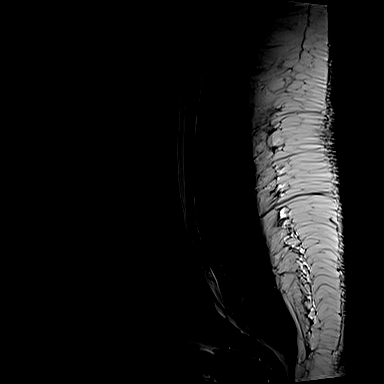
[im 10/13]
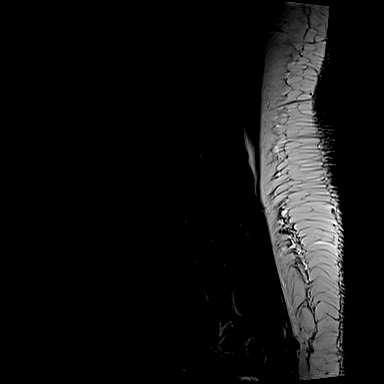
[im 13/13]
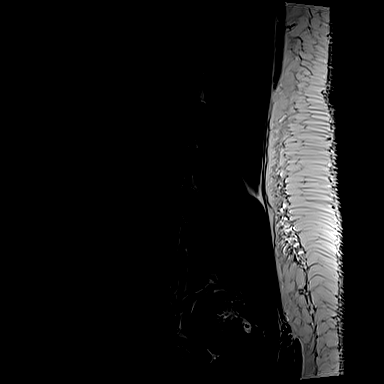

[Series 7: T1 · sagittal · 4.0mm · 0.73mm/px · 5 of 13 slices shown (1 of 2)]
[im 1/13]
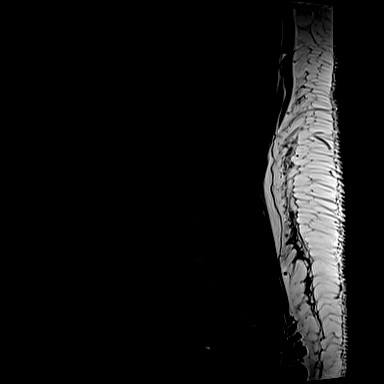
[im 4/13]
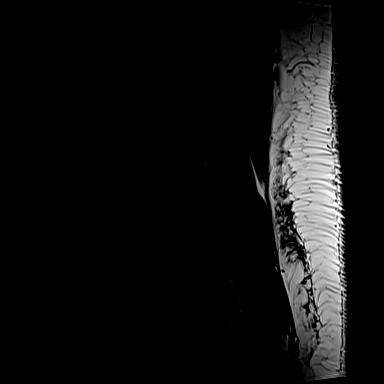
[im 7/13]
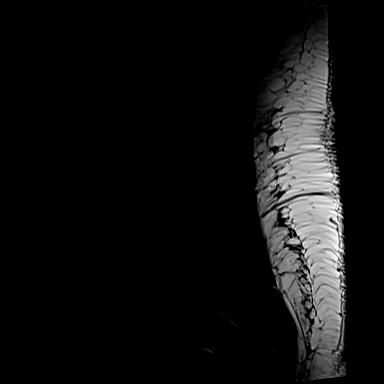
[im 10/13]
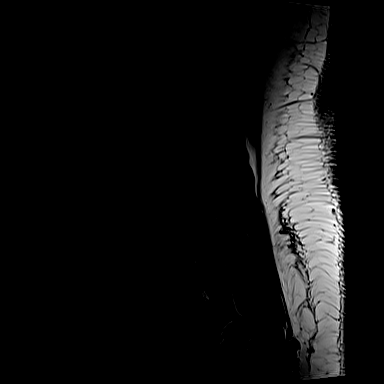
[im 13/13]
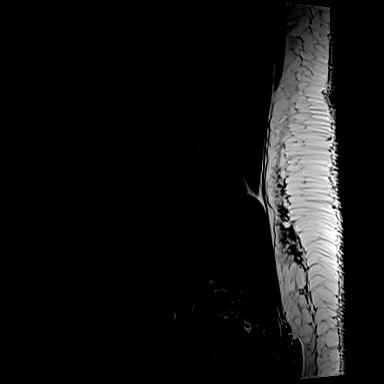

[Series 11: T2 · axial · 4.0mm · 0.35mm/px · z∈[-48,+151]mm · 10 of 37 slices shown (2 of 2)]
[im 3/37]
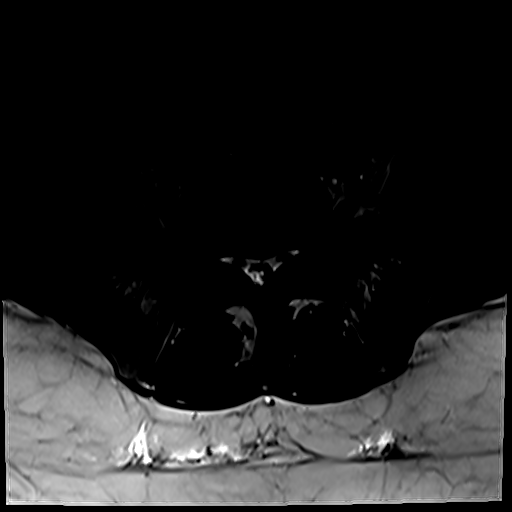
[im 5/37]
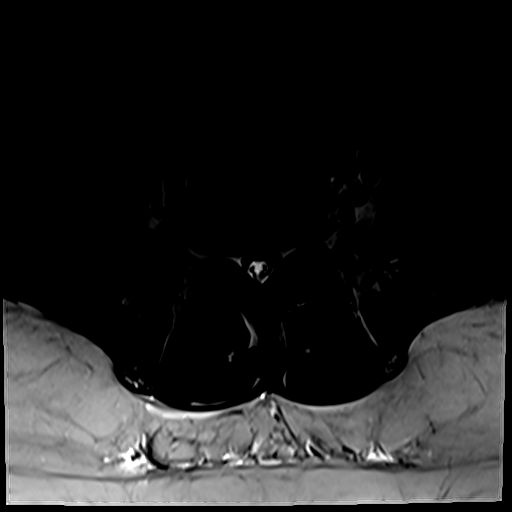
[im 8/37]
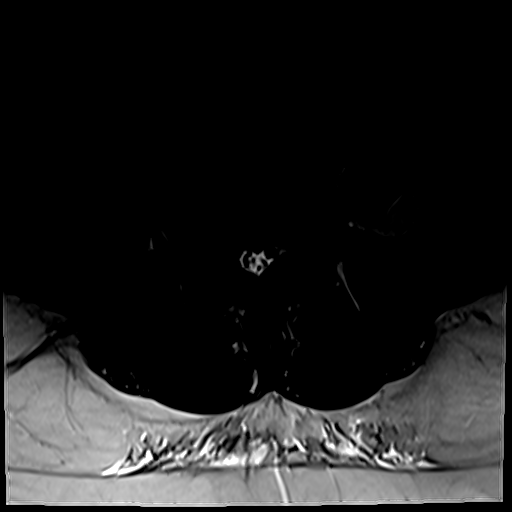
[im 13/37]
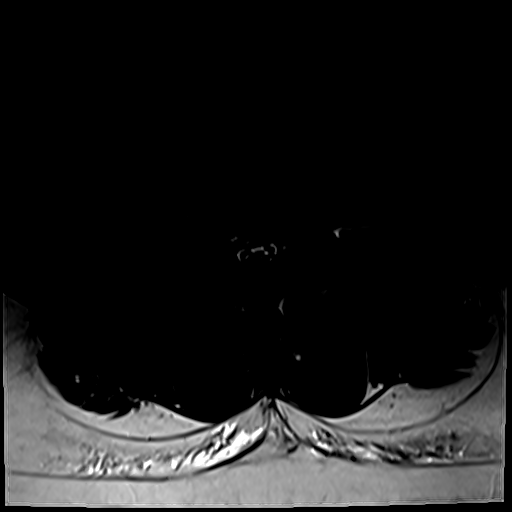
[im 17/37]
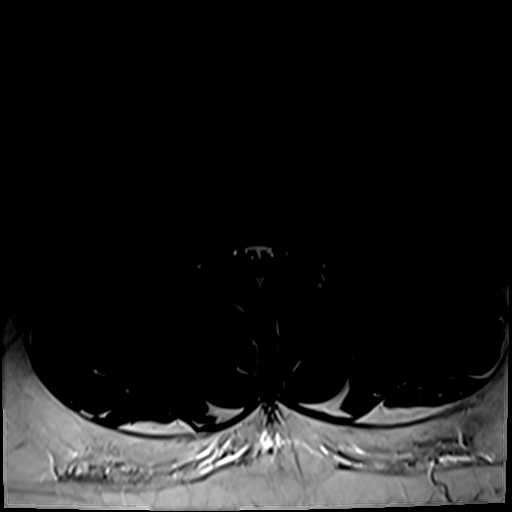
[im 20/37]
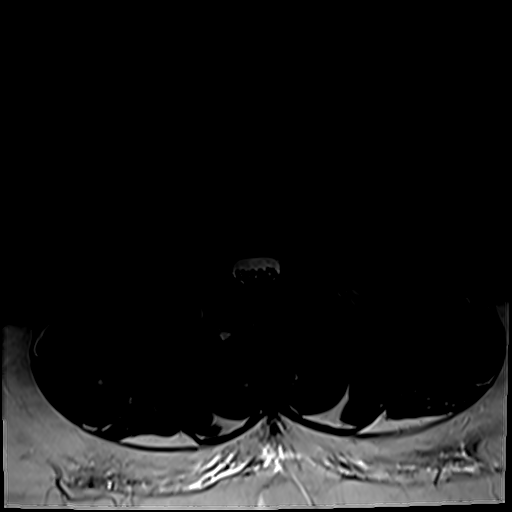
[im 22/37]
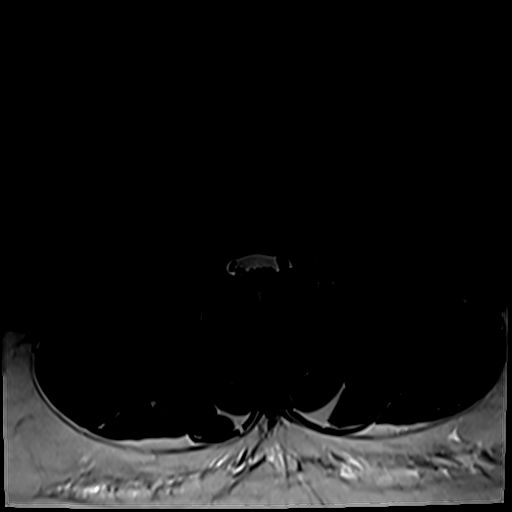
[im 27/37]
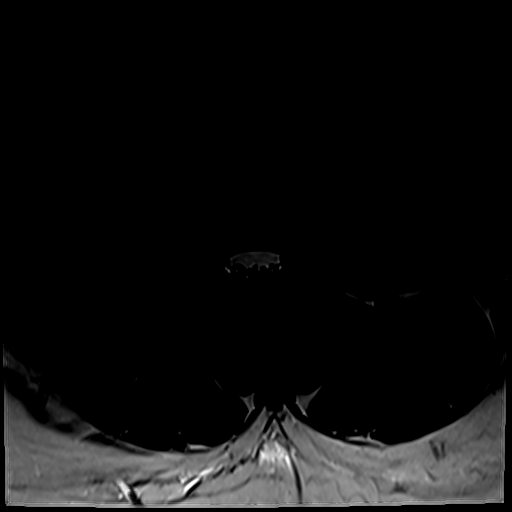
[im 32/37]
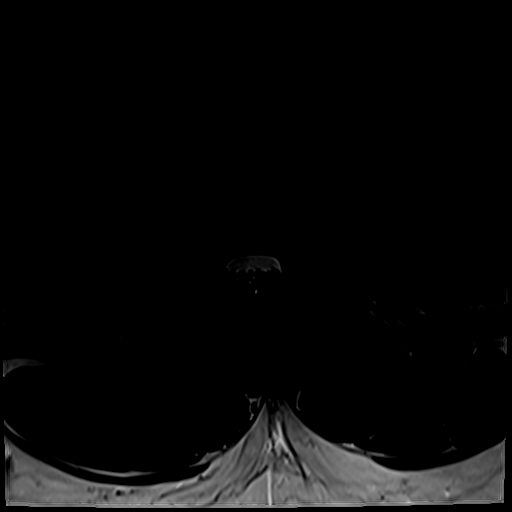
[im 37/37]
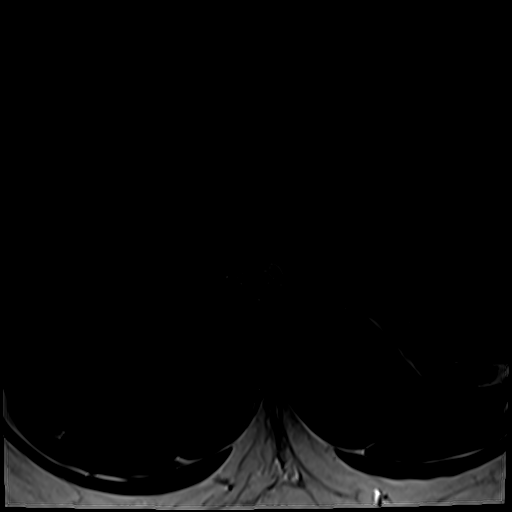

[Series 14: T1 · axial · 4.0mm · 0.35mm/px · z∈[-48,+126]mm · 4 of 37 slices shown (2 of 2)]
[im 3/37]
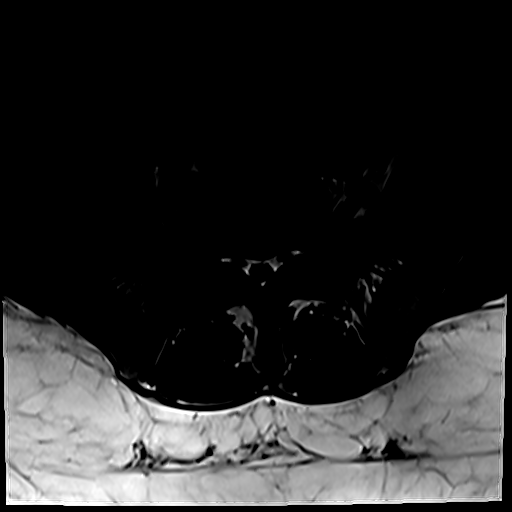
[im 5/37]
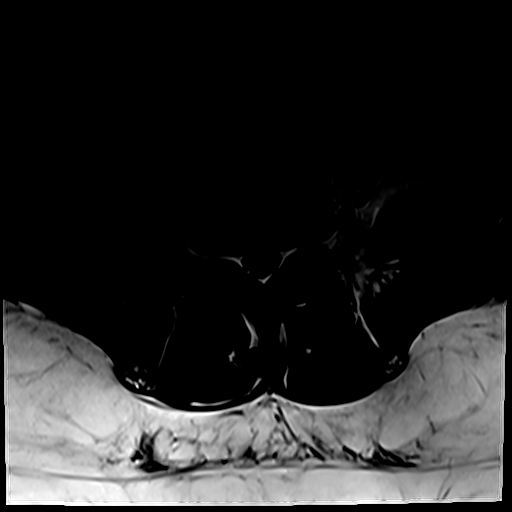
[im 20/37]
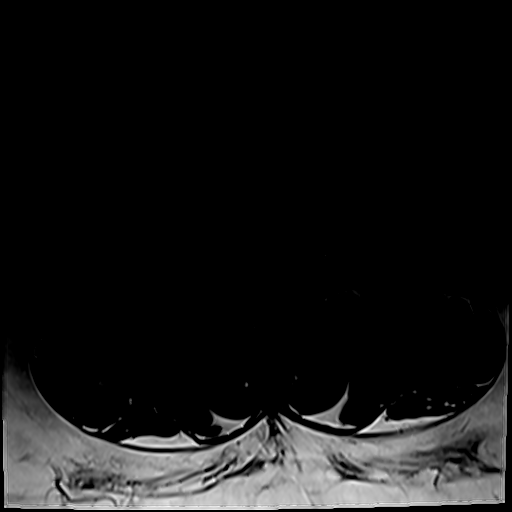
[im 32/37]
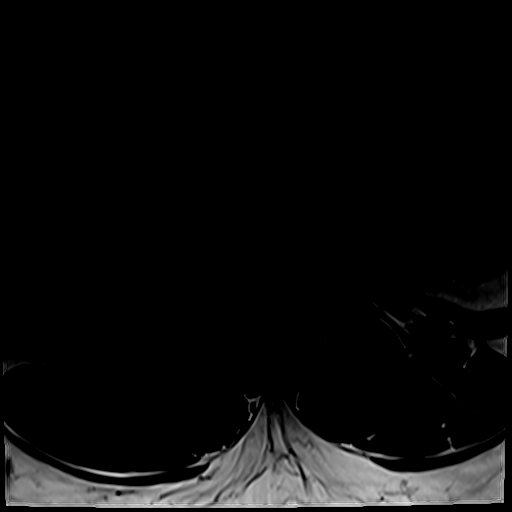

[24 of 48 positions shown; findings below may reference images not displayed]

FINDINGS: Segmentation:  Lumbar radiculopathy.

Alignment:  Mild anterolisthesis at L4-5

Vertebrae:  No fracture, evidence of discitis, or bone lesion.

Conus medullaris and cauda equina: Conus extends to the L1 level.
Conus appears normal. There is nerve root redundancy below the
advanced spinal stenosis at L4-5.

Paraspinal and other soft tissues: Negative

Disc levels:

L4-5 facet degeneration with hypertrophy and anterolisthesis. The
disc is narrowed and bulging and there is a right eccentric
protrusion extending to the level of the foramen. Spinal stenosis is
advanced. The lower lumbar canal is narrow at baseline due to short
pedicles.
IMPRESSION: L4-5 advanced spinal stenosis due to short pedicles, disc
protrusion, and facet osteoarthritis with anterolisthesis.

## 2019-08-13 NOTE — Progress Notes (Signed)
Needs f/u appt 

## 2019-08-19 ENCOUNTER — Other Ambulatory Visit: Payer: Self-pay

## 2019-08-19 ENCOUNTER — Encounter: Payer: Self-pay | Admitting: Orthopaedic Surgery

## 2019-08-19 ENCOUNTER — Ambulatory Visit (INDEPENDENT_AMBULATORY_CARE_PROVIDER_SITE_OTHER): Payer: Self-pay | Admitting: Orthopaedic Surgery

## 2019-08-19 VITALS — Ht 66.0 in | Wt 284.0 lb

## 2019-08-19 DIAGNOSIS — M5416 Radiculopathy, lumbar region: Secondary | ICD-10-CM

## 2019-08-19 MED ORDER — ACETAMINOPHEN-CODEINE #3 300-30 MG PO TABS: 1.0000 | ORAL_TABLET | Freq: Three times a day (TID) | ORAL | 1 refills | Status: DC | PRN

## 2019-08-19 NOTE — Progress Notes (Signed)
Office Visit Note   Patient: Donna Burns           Date of Birth: [birth year]           MRN: [id number] Visit Date: 08/19/2019              Requested by: Julieanne Manson, MD [address] [place],  [place] [address] PCP: Julieanne Manson, MD   Assessment & Plan: Visit Diagnoses:  1. Radiculopathy, lumbar region     Plan: Impression is bilateral lower extremity lumbar radiculopathy with evidence of spinal stenosis at L4-5 as well as disc protrusion on the right and facet osteoarthritis.  At this point, we will refer the patient to Dr. Alvester Morin for an [facility].  I will call in Tylenol 3 to take as needed.  She will follow-up with [place] as needed.  Follow-Up Instructions: Return if symptoms worsen or fail to improve.   Orders:  No orders of the defined types were placed in this encounter.  Meds ordered this encounter  Medications  . acetaminophen-codeine (TYLENOL #3) 300-30 MG tablet    Sig: Take 1 tablet by mouth 3 (three) times daily as needed for moderate pain.    Dispense:  30 tablet    Refill:  1      Procedures: No procedures performed   Clinical Data: No additional findings.   Subjective: Chief Complaint  Patient presents with  . Lower Back - Follow-up    MRI follow up    HPI patient is a pleasant 38 year old female who comes in today to discuss MRI results of her lumbar spine.  This has been ongoing for over a year and a half.  She is getting radiculopathy to both lower extremities.  She has tried oral medications without relief of symptoms.  No previous ESI.  MRI of the lumbar spine shows advanced spinal stenosis L4-5 with disc protrusion to the right as well as facet arthritis with anterolisthesis.       Objective: Vital Signs: Ht 5\' 6"  (1.676 m)   Wt 284 lb (128.8 kg)   BMI 45.84 kg/m     Ortho Exam stable lumbar exam  Specialty Comments:  No specialty comments available.  Imaging: No new imaging   PMFS History: Patient Active Problem  List   Diagnosis Date Noted  . Stage 1 chronic kidney disease 02/26/2019  . Tinea pedis of both feet 02/26/2019  . Tinea corporis 02/26/2019  . Prediabetes   . VAGINAL DISCHARGE 04/13/2010  . ANEMIA 10/27/2008  . VAGINITIS, BACTERIAL 10/27/2008  . Essential hypertension 06/21/2008  . LEG CRAMPS 06/21/2008  . ENDOMETRIAL HYPERPLASIA UNSPECIFIED 01/18/2008  . Morbid obesity ([facility]) 10/02/2007  . ECZEMA 10/02/2007  . 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 DENTAL CARIES 08/19/2007  . PCOS (polycystic ovarian syndrome) 05/27/1997   Past Medical History:  Diagnosis Date  . Bilateral polycystic ovarian syndrome   . HSV-2 (herpes simplex virus 2) infection   . Hypertension 2008  . Prediabetes     Family History  Problem Relation Age of Onset  . Heart failure Mother   . Diabetes Mother   . Hypertension Mother   . Stroke Father        Nursing home bound  . Hypertension Father   . Dementia Father        from strokes  . Other Sister        Prediabetes  . Hypertension Sister     Past Surgical History:  Procedure Laterality Date  . KNEE SURGERY  Right 2015   torn cartilage--arthroscopic  . MOUTH SURGERY  2016   9 teeth pulled for decay-upper incisors.   Social History   Occupational History  . Not on file  Tobacco Use  . Smoking status: Never Smoker  . Smokeless tobacco: Never Used  Substance and Sexual Activity  . Alcohol use: No  . Drug use: No  . Sexual activity: Yes    Birth control/protection: None

## 2019-08-20 ENCOUNTER — Other Ambulatory Visit: Payer: Self-pay | Admitting: Internal Medicine

## 2019-08-23 ENCOUNTER — Telehealth: Payer: Self-pay | Admitting: Internal Medicine

## 2019-08-23 NOTE — Telephone Encounter (Signed)
Patient called requesting Rx on acyclovir (ZOVIRAX) 400 MG tablet to be called in at the default Pharmacy.

## 2019-08-24 MED ORDER — ACYCLOVIR 400 MG PO TABS: ORAL_TABLET | ORAL | 0 refills | Status: DC

## 2019-08-24 NOTE — Telephone Encounter (Signed)
Rx request was sent over via e-escribe

## 2019-08-30 ENCOUNTER — Ambulatory Visit: Payer: Self-pay | Admitting: Internal Medicine

## 2019-09-13 ENCOUNTER — Other Ambulatory Visit: Payer: Self-pay

## 2019-09-13 ENCOUNTER — Ambulatory Visit (INDEPENDENT_AMBULATORY_CARE_PROVIDER_SITE_OTHER): Payer: Self-pay | Admitting: Physical Medicine and Rehabilitation

## 2019-09-13 ENCOUNTER — Ambulatory Visit: Payer: Self-pay

## 2019-09-13 ENCOUNTER — Encounter: Payer: Self-pay | Admitting: Physical Medicine and Rehabilitation

## 2019-09-13 VITALS — BP 152/104 | HR 71

## 2019-09-13 DIAGNOSIS — M48062 Spinal stenosis, lumbar region with neurogenic claudication: Secondary | ICD-10-CM

## 2019-09-13 MED ORDER — METHYLPREDNISOLONE ACETATE 80 MG/ML IJ SUSP
40.0000 mg | Freq: Once | INTRAMUSCULAR | Status: AC
  Administered 2019-09-13: 40 mg

## 2019-09-13 NOTE — Progress Notes (Signed)
 .  Numeric Pain Rating Scale and Functional Assessment Average Pain 8   In the last MONTH (on 0-10 scale) has pain interfered with the following?  1. General activity like being  able to carry out your everyday physical activities such as walking, climbing stairs, carrying groceries, or moving a chair?  Rating(8)   +Driver, -BT, -Dye Allergies.  

## 2019-09-14 NOTE — Progress Notes (Signed)
Donna Burns - 39 y.o. female MRN [id number]  Date of birth: 1980-12-30  Office Visit Note: Visit Date: 09/13/2019 PCP: Mack Hook, MD Referred by: Mack Hook, MD  Subjective: Chief Complaint  Patient presents with  . Lower Back - Pain  . Left Leg - Pain   HPI: Donna Burns is a 39 y.o. female who comes in today For lumbar epidural injection at the request of Tawanna Cooler, PA-C and Dr. Eduard Roux.  Patient has failed conservative care and does have MRI findings of pretty significant multifactorial stenosis at L4-5.  The rest of her spine actually looks fairly normal.  At L4-5 she has a combination of some facet joint hypertrophy as well as ligamentum flavum thickening and disc protrusion.  She is having low back and left more than right hip and thigh pain.  Symptoms are worse with standing and ambulating better at rest and sitting.  She is trying to get back to the gym and working out in the water.  She reports gaining a lot of weight because of the back pain and not been able to access the gym.  She says she has a lot of equipment in her home.  I did tell her she can get back in the pool really anytime she wants and we talked at length about what to expect from the injection.  She may do well with surgical consultation note for decompression at this 1 level.  Her case is complicated by morbid obesity and polycystic ovary syndrome.  The first step would be diagnostic and hopefully therapeutic left L5-S1 interlaminar injection.  Depending on relief could look at transforaminal approach.  ROS Otherwise per HPI.  Assessment & Plan: Visit Diagnoses:  1. Spinal stenosis of lumbar region with neurogenic claudication     Plan: No additional findings.   Meds & Orders:  Meds ordered this encounter  Medications  . methylPREDNISolone acetate (DEPO-MEDROL) injection 40 mg    Orders Placed This Encounter  Procedures  . XR C-ARM NO REPORT  . Epidural Steroid  injection    Follow-up: Return if symptoms worsen or fail to improve.   Procedures: No procedures performed  Lumbar Epidural Steroid Injection - Interlaminar Approach with Fluoroscopic Guidance  Patient: Donna Burns      Date of Birth: January 17, 1981 MRN: [id number] PCP: Mack Hook, MD      Visit Date: 09/13/2019   Universal Protocol:     Consent Given By: the patient  Position: PRONE  Additional Comments: Vital signs were monitored before and after the procedure. Patient was prepped and draped in the usual sterile fashion. The correct patient, procedure, and site was verified.   Injection Procedure Details:  Procedure Site One Meds Administered:  Meds ordered this encounter  Medications  . methylPREDNISolone acetate (DEPO-MEDROL) injection 40 mg     Laterality: Left  Location/Site:  L5-S1  Needle size: 20 G  Needle type: Tuohy  Needle Placement: Paramedian epidural  Findings:   -Comments: Excellent flow of contrast into the epidural space.  Procedure Details: Using a paramedian approach from the side mentioned above, the region overlying the inferior lamina was localized under fluoroscopic visualization and the soft tissues overlying this structure were infiltrated with 4 ml. of 1% Lidocaine without Epinephrine. The Tuohy needle was inserted into the epidural space using a paramedian approach.   The epidural space was localized using loss of resistance along with lateral and bi-planar fluoroscopic views.  After negative aspirate for air, blood, and CSF, a 2  ml. volume of Isovue-250 was injected into the epidural space and the flow of contrast was observed. Radiographs were obtained for documentation purposes.    The injectate was administered into the level noted above.   Additional Comments:  The patient tolerated the procedure well Dressing: 2 x 2 sterile gauze and Band-Aid    Post-procedure details: Patient was observed during the  procedure. Post-procedure instructions were reviewed.  Patient left the clinic in stable condition.    Clinical History: MRI LUMBAR SPINE WITHOUT CONTRAST  TECHNIQUE: Multiplanar, multisequence MR imaging of the lumbar spine was performed. No intravenous contrast was administered.  COMPARISON:  None.  FINDINGS: Segmentation:  Lumbar radiculopathy.  Alignment:  Mild anterolisthesis at L4-5  Vertebrae:  No fracture, evidence of discitis, or bone lesion.  Conus medullaris and cauda equina: Conus extends to the L1 level. Conus appears normal. There is nerve root redundancy below the advanced spinal stenosis at L4-5.  Paraspinal and other soft tissues: Negative  Disc levels:  L4-5 facet degeneration with hypertrophy and anterolisthesis. The disc is narrowed and bulging and there is a right eccentric protrusion extending to the level of the foramen. Spinal stenosis is advanced. The lower lumbar canal is narrow at baseline due to short pedicles.  IMPRESSION: L4-5 advanced spinal stenosis due to short pedicles, disc protrusion, and facet osteoarthritis with anterolisthesis.   Electronically Signed   By: Marnee Spring M.D.   On: 08/13/2019 06:24   She reports that she has never smoked. She has never used smokeless tobacco.  Recent Labs    02/26/19 1634  HGBA1C 6.4*    Objective:  VS:  HT:    WT:   BMI:     BP:(!) 152/104  HR:71bpm  TEMP: ( )  RESP:  Physical Exam  Ortho Exam Imaging: XR C-ARM NO REPORT  Result Date: 09/13/2019 Please see Notes tab for imaging impression.   Past Medical/Family/Surgical/Social History: Medications & Allergies reviewed per EMR, new medications updated. Patient Active Problem List   Diagnosis Date Noted  . Stage 1 chronic kidney disease 02/26/2019  . Tinea pedis of both feet 02/26/2019  . Tinea corporis 02/26/2019  . Prediabetes   . VAGINAL DISCHARGE 04/13/2010  . ANEMIA 10/27/2008  . VAGINITIS, BACTERIAL  10/27/2008  . Essential hypertension 06/21/2008  . LEG CRAMPS 06/21/2008  . ENDOMETRIAL HYPERPLASIA UNSPECIFIED 01/18/2008  . Morbid obesity (HCC) 10/02/2007  . ECZEMA 10/02/2007  . 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 DENTAL CARIES 08/19/2007  . PCOS (polycystic ovarian syndrome) 05/27/1997   Past Medical History:  Diagnosis Date  . Bilateral polycystic ovarian syndrome   . HSV-2 (herpes simplex virus 2) infection   . Hypertension 2008  . Prediabetes    Family History  Problem Relation Age of Onset  . Heart failure Mother   . Diabetes Mother   . Hypertension Mother   . Stroke Father        Nursing home bound  . Hypertension Father   . Dementia Father        from strokes  . Other Sister        Prediabetes  . Hypertension Sister    Past Surgical History:  Procedure Laterality Date  . KNEE SURGERY Right 2015   torn cartilage--arthroscopic  . MOUTH SURGERY  2016   9 teeth pulled for decay-upper incisors.   Social History   Occupational History  . Not on file  Tobacco Use  . Smoking status: Never Smoker  . Smokeless tobacco: Never Used  Substance  and Sexual Activity  . Alcohol use: No  . Drug use: No  . Sexual activity: Yes    Birth control/protection: None

## 2019-09-14 NOTE — Procedures (Signed)
Lumbar Epidural Steroid Injection - Interlaminar Approach with Fluoroscopic Guidance  Patient: Donna Burns      Date of Birth: 04-03-1981 MRN: [id number] PCP: Julieanne Manson, MD      Visit Date: 09/13/2019   Universal Protocol:     Consent Given By: the patient  Position: PRONE  Additional Comments: Vital signs were monitored before and after the procedure. Patient was prepped and draped in the usual sterile fashion. The correct patient, procedure, and site was verified.   Injection Procedure Details:  Procedure Site One Meds Administered:  Meds ordered this encounter  Medications  . methylPREDNISolone acetate (DEPO-MEDROL) injection 40 mg     Laterality: Left  Location/Site:  L5-S1  Needle size: 20 G  Needle type: Tuohy  Needle Placement: Paramedian epidural  Findings:   -Comments: Excellent flow of contrast into the epidural space.  Procedure Details: Using a paramedian approach from the side mentioned above, the region overlying the inferior lamina was localized under fluoroscopic visualization and the soft tissues overlying this structure were infiltrated with 4 ml. of 1% Lidocaine without Epinephrine. The Tuohy needle was inserted into the epidural space using a paramedian approach.   The epidural space was localized using loss of resistance along with lateral and bi-planar fluoroscopic views.  After negative aspirate for air, blood, and CSF, a 2 ml. volume of Isovue-250 was injected into the epidural space and the flow of contrast was observed. Radiographs were obtained for documentation purposes.    The injectate was administered into the level noted above.   Additional Comments:  The patient tolerated the procedure well Dressing: 2 x 2 sterile gauze and Band-Aid    Post-procedure details: Patient was observed during the procedure. Post-procedure instructions were reviewed.  Patient left the clinic in stable condition.

## 2019-09-18 DIAGNOSIS — G8929 Other chronic pain: Secondary | ICD-10-CM | POA: Insufficient documentation

## 2019-09-18 DIAGNOSIS — L309 Dermatitis, unspecified: Secondary | ICD-10-CM | POA: Insufficient documentation

## 2019-09-21 ENCOUNTER — Other Ambulatory Visit: Payer: Self-pay

## 2019-09-21 ENCOUNTER — Ambulatory Visit (INDEPENDENT_AMBULATORY_CARE_PROVIDER_SITE_OTHER): Payer: No Typology Code available for payment source | Admitting: Obstetrics and Gynecology

## 2019-09-21 ENCOUNTER — Other Ambulatory Visit (HOSPITAL_COMMUNITY)
Admission: RE | Admit: 2019-09-21 | Discharge: 2019-09-21 | Disposition: A | Discharge status: Home / Self Care | Payer: Self-pay | Source: Ambulatory Visit | Attending: Obstetrics and Gynecology | Admitting: Obstetrics and Gynecology

## 2019-09-21 ENCOUNTER — Encounter: Payer: Self-pay | Admitting: Obstetrics and Gynecology

## 2019-09-21 VITALS — BP 133/84 | HR 88 | Temp 97.4°F | Ht 68.0 in | Wt 293.0 lb

## 2019-09-21 DIAGNOSIS — Z01419 Encounter for gynecological examination (general) (routine) without abnormal findings: Secondary | ICD-10-CM

## 2019-09-21 DIAGNOSIS — Z3202 Encounter for pregnancy test, result negative: Secondary | ICD-10-CM

## 2019-09-21 DIAGNOSIS — Z1272 Encounter for screening for malignant neoplasm of vagina: Secondary | ICD-10-CM

## 2019-09-21 LAB — POCT URINE PREGNANCY: Preg Test, Ur: NEGATIVE

## 2019-09-21 MED ORDER — MEDROXYPROGESTERONE ACETATE 10 MG PO TABS: 10.0000 mg | ORAL_TABLET | Freq: Every day | ORAL | 12 refills | Status: DC

## 2019-09-21 MED ORDER — METFORMIN HCL 500 MG PO TABS: 500.0000 mg | ORAL_TABLET | Freq: Three times a day (TID) | ORAL | 5 refills | Status: DC

## 2019-09-21 NOTE — Progress Notes (Signed)
New GYN presents for AEX/PAP/STD screening. C/o not having a period since September 2020. Mother was recently diagnosed with Breast Cancer.

## 2019-09-21 NOTE — Progress Notes (Signed)
Subjective:     Donna Burns is a 39 y.o. female P0 with BMI 44 who is here for a comprehensive physical exam. The patient reports no problems. She has been amenorrheic since September 2020. Patient with known history of PCOS and irregular cycles, previously on provera. Patient is sexually active without complaints. She denies pelvic pain or abnormal discharge  Past Medical History:  Diagnosis Date  . Bilateral polycystic ovarian syndrome   . Chronic kidney disease   . HSV-2 (herpes simplex virus 2) infection   . Hypertension 2008  . Prediabetes   . Vaginal Pap smear, abnormal    Past Surgical History:  Procedure Laterality Date  . KNEE SURGERY Right 2015   torn cartilage--arthroscopic  . MOUTH SURGERY  2016   9 teeth pulled for decay-upper incisors.   Family History  Problem Relation Age of Onset  . Heart failure Mother   . Diabetes Mother   . Hypertension Mother   . Stroke Father        Nursing home bound  . Hypertension Father   . Dementia Father        from strokes  . Other Sister        Prediabetes  . Hypertension Sister     Social History   Socioeconomic History  . Marital status: Single    Spouse name: [name]  . Number of children: 0  . Years of education: Not on file  . Highest education level: Bachelor's degree (e.g., BA, AB, BS)  Occupational History  . Not on file  Tobacco Use  . Smoking status: Never Smoker  . Smokeless tobacco: Never Used  Substance and Sexual Activity  . Alcohol use: No  . Drug use: No  . Sexual activity: Yes    Birth control/protection: None  Other Topics Concern  . Not on file  Social History Narrative   Lives with husband in [place].   He is a Education officer, environmental   She is a Forensic scientist.   Social Determinants of Health   Financial Resource Strain:   . Difficulty of Paying Living Expenses:   Food Insecurity: No Food Insecurity  . Worried About Programme researcher, broadcasting/film/video in the Last Year: Never true  . Ran  Out of Food in the Last Year: Never true  Transportation Needs: No Transportation Needs  . Lack of Transportation (Medical): No  . Lack of Transportation (Non-Medical): No  Physical Activity:   . Days of Exercise per Week:   . Minutes of Exercise per Session:   Stress:   . Feeling of Stress :   Social Connections:   . Frequency of Communication with Friends and Family:   . Frequency of Social Gatherings with Friends and Family:   . Attends Religious Services:   . Active Member of Clubs or Organizations:   . Attends Banker Meetings:   Marland Kitchen Marital Status:   Intimate Partner Violence: Not At Risk  . Fear of Current or Ex-Partner: No  . Emotionally Abused: No  . Physically Abused: No  . Sexually Abused: No   Health Maintenance  Topic Date Due  . COVID-19 Vaccine (1) Never done  . PAP SMEAR-Modifier  11/09/2012  . INFLUENZA VACCINE  12/26/2019  . TETANUS/TDAP  02/25/2029  . HIV Screening  Completed       Review of Systems Pertinent items noted in HPI and remainder of comprehensive ROS otherwise negative.   Objective:  Blood pressure 133/84, pulse 88, temperature (!) 97.4 F (  36.3 C), height 5\' 8"  (1.727 m), weight 293 lb (132.9 kg), last menstrual period 01/30/2019.     GENERAL: Well-developed, well-nourished female in no acute distress.  HEENT: Normocephalic, atraumatic. Sclerae anicteric.  NECK: Supple. Normal thyroid.  LUNGS: Clear to auscultation bilaterally.  HEART: Regular rate and rhythm. BREASTS: Symmetric in size. No palpable masses or lymphadenopathy, skin changes, or nipple drainage. ABDOMEN: Soft, nontender, nondistended. No organomegaly. PELVIC: Normal external female genitalia. Vagina is pink and rugated.  Normal discharge. Normal appearing cervix. Bimanual exam limited secondary to body habitus EXTREMITIES: No cyanosis, clubbing, or edema, 2+ distal pulses.    Assessment:    Healthy female exam.      Plan:    Pap smear and STI screening  collected Screening mammogram ordered Rx provera provided to trigger period given PCOS Rx metformin also provided Referral to nutritionist to assist with weight loss See After Visit Summary for Counseling Recommendations

## 2019-09-22 LAB — COMPREHENSIVE METABOLIC PANEL
ALT: 16 IU/L (ref 0–32)
AST: 19 IU/L (ref 0–40)
Albumin/Globulin Ratio: 1.2 (ref 1.2–2.2)
Albumin: 4.1 g/dL (ref 3.8–4.8)
Alkaline Phosphatase: 53 IU/L (ref 39–117)
BUN/Creatinine Ratio: 13 (ref 9–23)
BUN: 19 mg/dL (ref 6–24)
Bilirubin Total: <0.2 mg/dL (ref 0.0–1.2)
CO2: 25 mmol/L (ref 20–29)
Calcium: 9.6 mg/dL (ref 8.7–10.2)
Chloride: 104 mmol/L (ref 96–106)
Creatinine, Ser: 1.42 mg/dL — ABNORMAL HIGH (ref 0.57–1.00)
GFR calc Af Amer: 53 mL/min/{1.73_m2} — ABNORMAL LOW (ref >59)
GFR calc non Af Amer: 46 mL/min/{1.73_m2} — ABNORMAL LOW (ref >59)
Globulin, Total: 3.4 g/dL (ref 1.5–4.5)
Glucose: 91 mg/dL (ref 65–99)
Potassium: 4.6 mmol/L (ref 3.5–5.2)
Sodium: 139 mmol/L (ref 134–144)
Total Protein: 7.5 g/dL (ref 6.0–8.5)

## 2019-09-22 LAB — CERVICOVAGINAL ANCILLARY ONLY
Chlamydia: NEGATIVE
Comment: NEGATIVE
Comment: NEGATIVE
Comment: NORMAL
Neisseria Gonorrhea: NEGATIVE
Trichomonas: NEGATIVE

## 2019-09-22 LAB — CYTOLOGY - PAP
Adequacy: ABSENT
Comment: NEGATIVE
Diagnosis: NEGATIVE
High risk HPV: NEGATIVE

## 2019-09-22 LAB — TSH: TSH: 2.43 u[IU]/mL (ref 0.450–4.500)

## 2019-09-22 LAB — HEPATITIS C ANTIBODY: Hep C Virus Ab: <0.1 s/co ratio (ref 0.0–0.9)

## 2019-09-22 LAB — FOLLICLE STIMULATING HORMONE: FSH: 7.1 m[IU]/mL

## 2019-09-22 LAB — PROLACTIN: Prolactin: 9.5 ng/mL (ref 4.8–23.3)

## 2019-09-22 LAB — HIV ANTIBODY (ROUTINE TESTING W REFLEX): HIV Screen 4th Generation wRfx: NONREACTIVE

## 2019-09-22 LAB — HEPATITIS B SURFACE ANTIGEN: Hepatitis B Surface Ag: NEGATIVE

## 2019-09-22 LAB — RPR: RPR Ser Ql: NONREACTIVE

## 2019-09-24 ENCOUNTER — Ambulatory Visit: Payer: Self-pay | Admitting: Internal Medicine

## 2019-09-29 ENCOUNTER — Ambulatory Visit: Payer: Self-pay | Admitting: Internal Medicine

## 2019-10-06 ENCOUNTER — Other Ambulatory Visit: Payer: Self-pay | Admitting: Internal Medicine

## 2019-10-06 DIAGNOSIS — Z1231 Encounter for screening mammogram for malignant neoplasm of breast: Secondary | ICD-10-CM

## 2019-10-13 ENCOUNTER — Ambulatory Visit
Admission: RE | Admit: 2019-10-13 | Discharge: 2019-10-13 | Disposition: A | Discharge status: Home / Self Care | Payer: No Typology Code available for payment source | Source: Ambulatory Visit | Attending: Obstetrics and Gynecology | Admitting: Obstetrics and Gynecology

## 2019-10-13 ENCOUNTER — Other Ambulatory Visit: Payer: Self-pay

## 2019-10-13 DIAGNOSIS — Z1231 Encounter for screening mammogram for malignant neoplasm of breast: Secondary | ICD-10-CM

## 2019-10-13 IMAGING — MG DIGITAL SCREENING BILAT W/ TOMO W/ CAD
8 of 14 series · 8 of 40 positions shown · non-contrast
Comparison: Previous exam(s).

CLINICAL DATA: Screening.

EXAM:
DIGITAL SCREENING BILATERAL MAMMOGRAM WITH TOMO AND CAD

[R CC synth-2D]
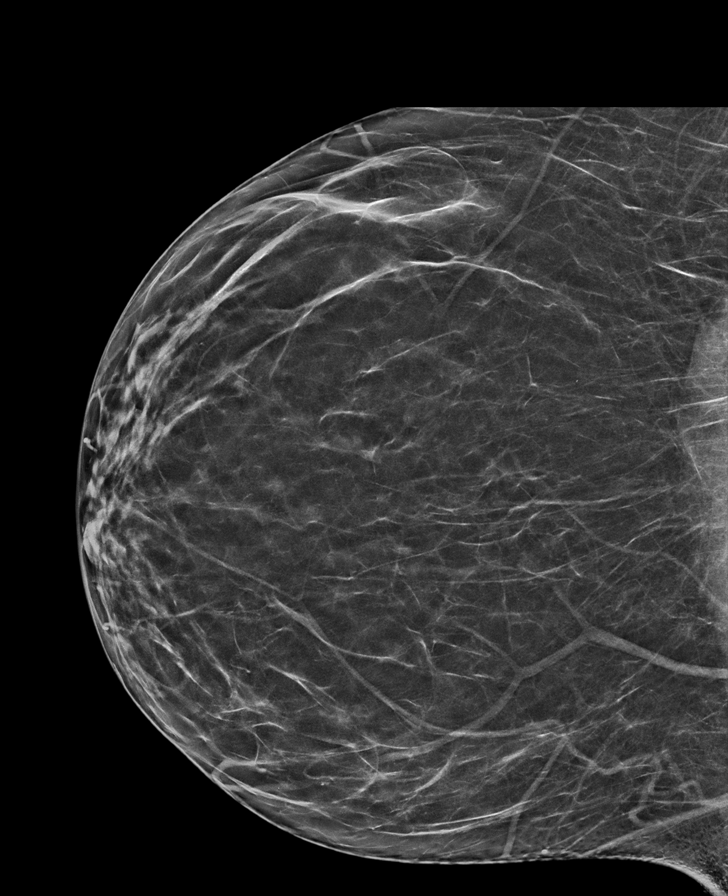

[L CC synth-2D]
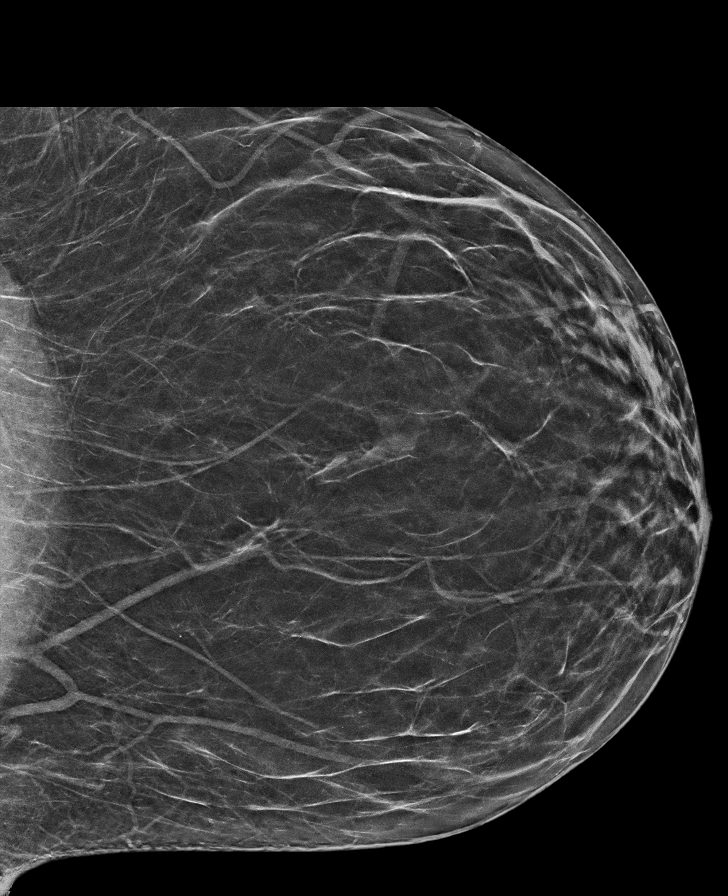

[L MLO synth-2D (1 of 3)]
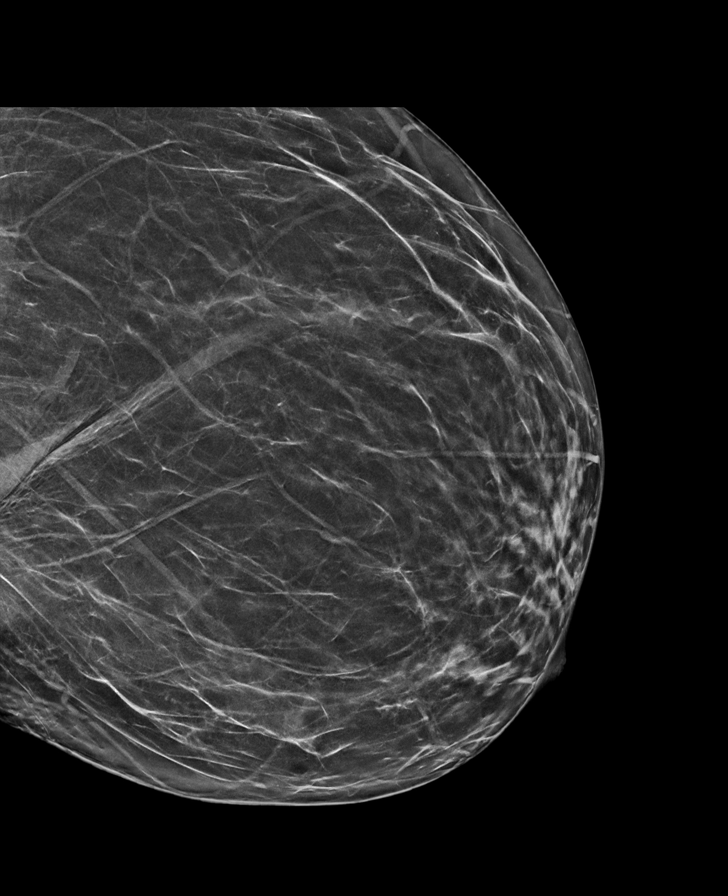

[L MLO synth-2D (2 of 3)]
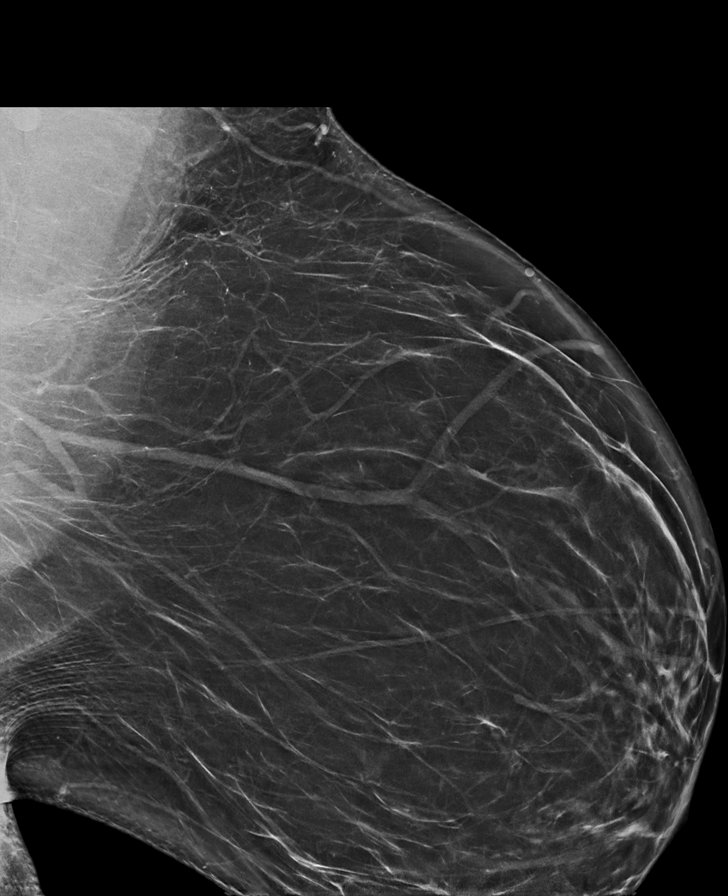

[R MLO synth-2D (1 of 2)]
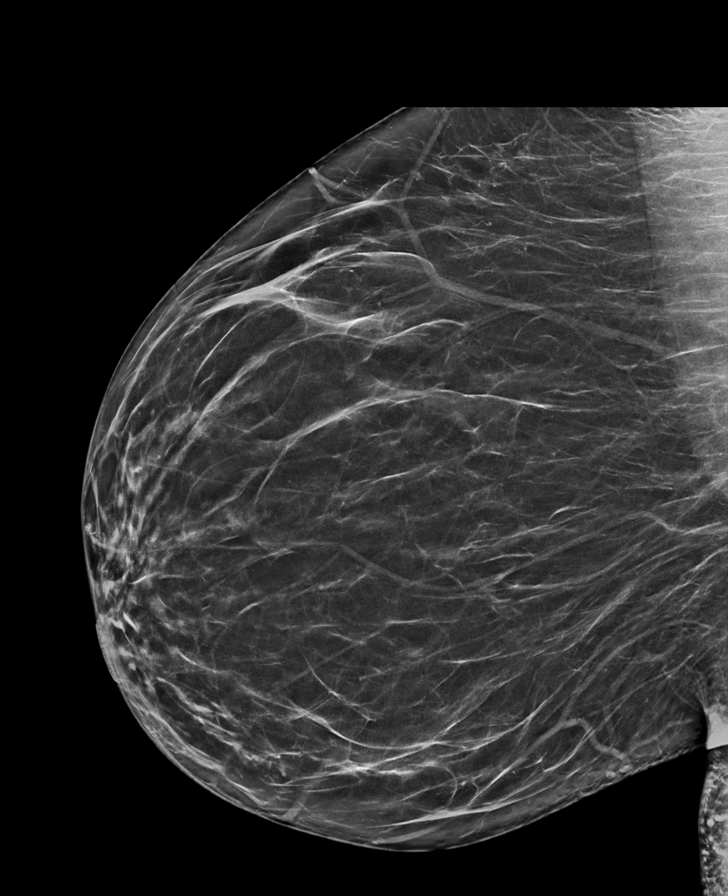

[R MLO synth-2D (2 of 2)]
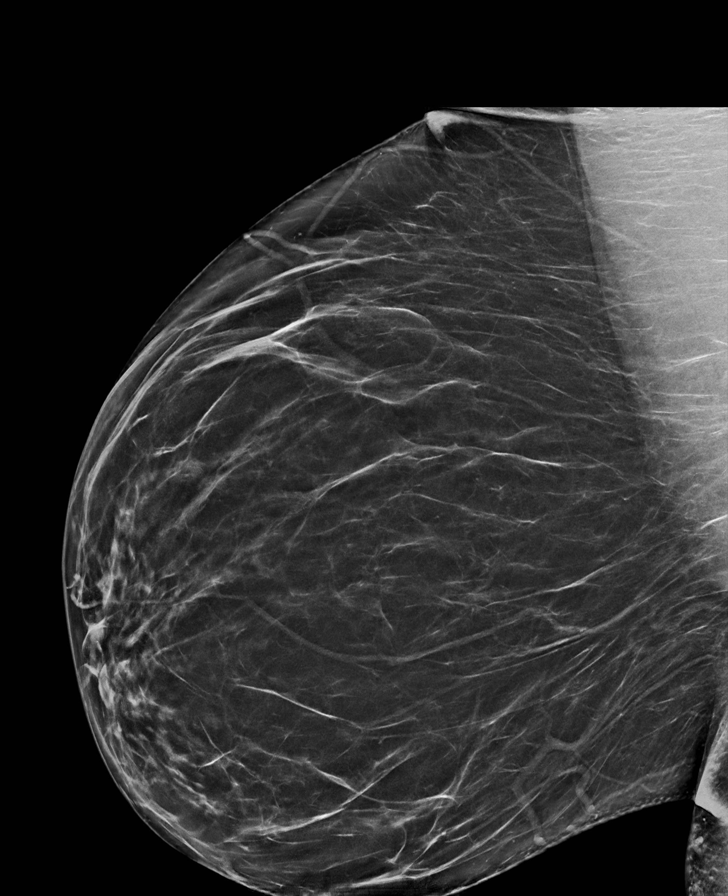

[L MLO synth-2D (3 of 3)]
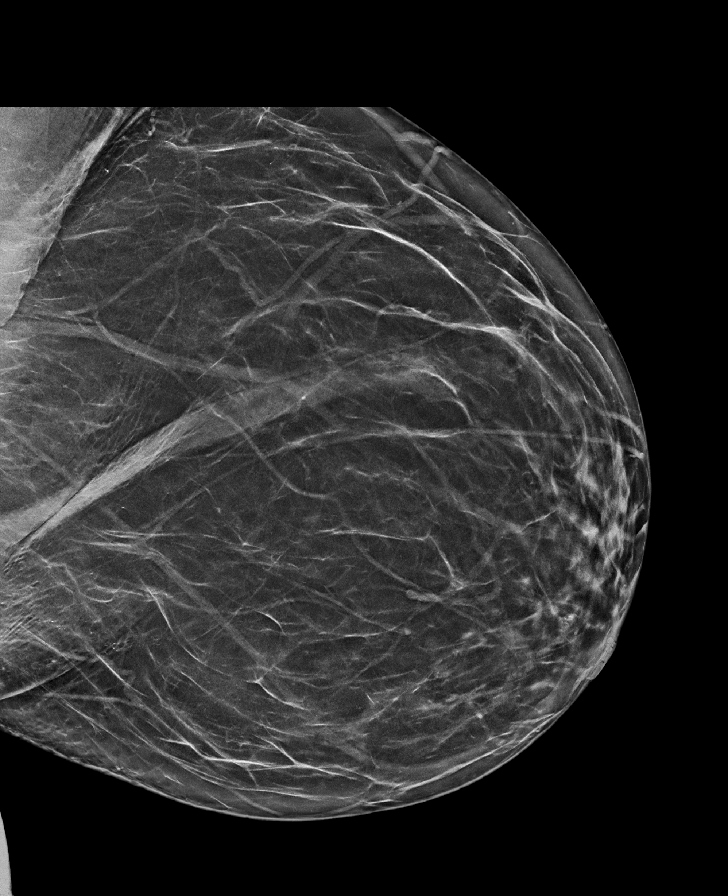

[L MLO tomo · tomo slice 41/80.0]
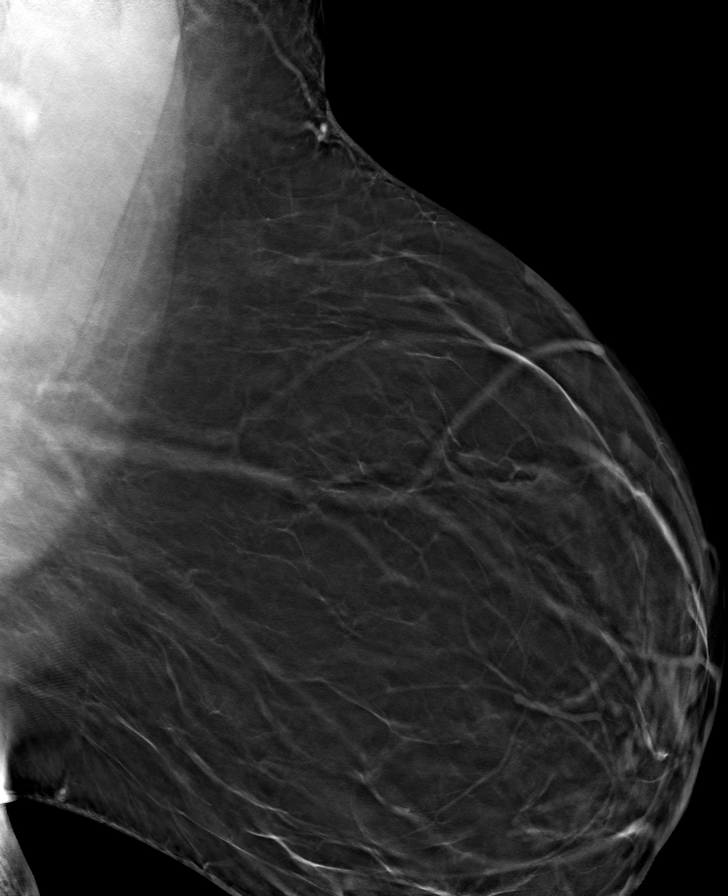

[8 of 40 positions shown; findings below may reference images not displayed]

ACR Breast Density Category b: There are scattered areas of
fibroglandular density.
FINDINGS: There are no findings suspicious for malignancy. Images were
processed with CAD.
IMPRESSION: No mammographic evidence of malignancy. A result letter of this
screening mammogram will be mailed directly to the patient.

RECOMMENDATION:
Screening mammogram in one year. (Code:[TQ])

BI-RADS CATEGORY  1: Negative.

## 2019-11-16 ENCOUNTER — Ambulatory Visit (INDEPENDENT_AMBULATORY_CARE_PROVIDER_SITE_OTHER): Payer: Self-pay | Admitting: Internal Medicine

## 2019-11-16 ENCOUNTER — Other Ambulatory Visit: Payer: Self-pay

## 2019-11-16 VITALS — BP 160/100 | HR 80 | Resp 12 | Ht 66.0 in | Wt 280.0 lb

## 2019-11-16 DIAGNOSIS — E282 Polycystic ovarian syndrome: Secondary | ICD-10-CM

## 2019-11-16 DIAGNOSIS — I10 Essential (primary) hypertension: Secondary | ICD-10-CM

## 2019-11-16 DIAGNOSIS — L309 Dermatitis, unspecified: Secondary | ICD-10-CM

## 2019-11-16 DIAGNOSIS — R7303 Prediabetes: Secondary | ICD-10-CM

## 2019-11-16 DIAGNOSIS — N181 Chronic kidney disease, stage 1: Secondary | ICD-10-CM

## 2019-11-16 NOTE — Progress Notes (Signed)
Subjective:    Patient ID: Donna Burns, female   DOB: 1981-01-29, 39 y.o.   MRN: [id number]   HPI   1.  Hypertension:  Continues on Labetalol 400 mg twice daily and Nifedipine XL 30 mg daily.  HOWEVER, she ran out of her Nifedipine XL for a week about a week ago and just back on it for a week.   Her mother is dealing with breast cancer.  She is not able to perform physical activity due to spinal stenosis.  [facility]   Not doing well with her diet.  Caring for her mother and different family members who used to live together are moving to other homes after her sister got back together with her husband.   Father has been in a nursing home sine 03/2018 and will be able to get out for the first time soon.    2.  Obesity:  Interested in gastric sleeve, going to nutritionist.  Discussed with her making long term behavioral changes and thinking about maintaining weight loss than yo yo of diet changes or taking Metformin.   3.  Rash on legs and arms:  Treated with oral Terbinafine in 02/2019 for 30 days, but patient states this recurs every spring and summer and goes away in November.  She does not have an orange card for derm referral.  Her foot lesions responded to Triamcinolone cream and ultimately resolved.  She still uses intermittently on feet.      Current Meds  Medication Sig  . acetaminophen-codeine (TYLENOL #3) 300-30 MG tablet Take 1 tablet by mouth 3 (three) times daily as needed for moderate pain.  Marland Kitchen labetalol (NORMODYNE) 200 MG tablet 2 tabs by mouth twice daily.  . metFORMIN (GLUCOPHAGE) 500 MG tablet Take 1 tablet (500 mg total) by mouth 3 (three) times daily with meals.  Marland Kitchen NIFEdipine (PROCARDIA-XL/NIFEDICAL-XL) 30 MG 24 hr tablet Take 1 tablet (30 mg total) by mouth daily.  . Tea Tree 100 % OIL Mix with Gold Bond Foot cream and apply all over dry flaky irritated areas of feet daily  . triamcinolone cream (KENALOG) 0.1 % Apply to  affected areas twice daily as needed   Allergies  Allergen Reactions  . Pork-Derived Products Other (See Comments)    Pt does not eat pork or use pork-derived products     Review of Systems    Objective:   BP (!) 160/100 (BP Location: Left Arm, Patient Position: Sitting, Cuff Size: Large)   Pulse 80   Resp 12   Ht 5\' 6"  (1.676 m)   Wt 280 lb (127 kg)   LMP 10/07/2019 (Exact Date)   BMI 45.19 kg/m   Physical Exam  NAD HEENT:  PERRL, EOMI Neck:  Supple, No adenopathy Chest:  CTA CV:  RRR without murmur or rub.  Radial and DP pulses normal and equal LE:  No edema Skin:  Right posterior thigh with wrinkled center of lesion and fine flaking at periphery--rounded lesions clustered together, largest about 4 cm in diameter Assessment & Plan  1.  Obesity/PCOS/Prediabetes:  A1C. Referral to Nutrition.  Discussed can apply for [facility] financial assistance. Urged her to make healthy lifestyle changes she can maintain and show she can stick with changes before contemplating bariatric surgery.  Shared multiple patient stories where people did not make these changes before surgery and were unsuccessful long term following bariatric surgery.  Not clear whether she is receptive to info.  2.  Hypertension with CKD:  CMP, UA today.  Was controlled on current regimen previously this spring.  Follow up 1 month for repeat bp and pulse once on Procardia XL without missing.  If at goal, consider wean of Labetalol and increase of procardia thereafter as not clear labetalol was particularly helpful.   3.  Skin lesions:  Recurrently on arms and legs.  Treated with Terbinafine in past with resolution, but could be just the time of year it resolves as per her history.  Other foot lesions resolved with Triamcinolone and felt to be eczema.  This could be nummular eczema.  Try on one lesion applying triamcinolone twice daily until resolved.  If successful, treat all.

## 2019-11-17 LAB — COMPREHENSIVE METABOLIC PANEL
ALT: 15 IU/L (ref 0–32)
AST: 14 IU/L (ref 0–40)
Albumin/Globulin Ratio: 1.4 (ref 1.2–2.2)
Albumin: 4.2 g/dL (ref 3.8–4.8)
Alkaline Phosphatase: 46 IU/L — ABNORMAL LOW (ref 48–121)
BUN/Creatinine Ratio: 10 (ref 9–23)
BUN: 12 mg/dL (ref 6–24)
Bilirubin Total: 0.2 mg/dL (ref 0.0–1.2)
CO2: 21 mmol/L (ref 20–29)
Calcium: 9.9 mg/dL (ref 8.7–10.2)
Chloride: 103 mmol/L (ref 96–106)
Creatinine, Ser: 1.22 mg/dL — ABNORMAL HIGH (ref 0.57–1.00)
GFR calc Af Amer: 64 mL/min/{1.73_m2} (ref >59)
GFR calc non Af Amer: 56 mL/min/{1.73_m2} — ABNORMAL LOW (ref >59)
Globulin, Total: 3.1 g/dL (ref 1.5–4.5)
Glucose: 137 mg/dL — ABNORMAL HIGH (ref 65–99)
Potassium: 4.4 mmol/L (ref 3.5–5.2)
Sodium: 135 mmol/L (ref 134–144)
Total Protein: 7.3 g/dL (ref 6.0–8.5)

## 2019-11-17 LAB — HGB A1C W/O EAG: Hgb A1c MFr Bld: 6 % — ABNORMAL HIGH (ref 4.8–5.6)

## 2019-11-18 ENCOUNTER — Telehealth: Payer: Self-pay | Admitting: Physical Medicine and Rehabilitation

## 2019-11-18 NOTE — Telephone Encounter (Signed)
ok 

## 2019-11-18 NOTE — Telephone Encounter (Signed)
Left message #1

## 2019-11-18 NOTE — Telephone Encounter (Signed)
Left message #2

## 2019-11-18 NOTE — Telephone Encounter (Signed)
See previous message

## 2019-11-18 NOTE — Telephone Encounter (Signed)
Patient called.   She is requesting an appointment for another back injection. Said her blood pressure was the hold up.   Call back: [phone]

## 2019-11-18 NOTE — Telephone Encounter (Signed)
Patient has [facility] discount good through 12/21/19.

## 2019-11-18 NOTE — Telephone Encounter (Signed)
Left L5-S1 IL 09/13/19. Ok to repeat if helped, same problem/side, and no new injury?

## 2019-11-18 NOTE — Telephone Encounter (Signed)
Patient returned call asked for a call back. The number to contact patient is [phone]

## 2019-11-24 NOTE — Telephone Encounter (Signed)
Left message #3

## 2019-11-26 ENCOUNTER — Ambulatory Visit (INDEPENDENT_AMBULATORY_CARE_PROVIDER_SITE_OTHER): Payer: Self-pay

## 2019-11-26 ENCOUNTER — Other Ambulatory Visit: Payer: Self-pay

## 2019-11-26 ENCOUNTER — Telehealth: Payer: Self-pay

## 2019-11-26 DIAGNOSIS — Z3201 Encounter for pregnancy test, result positive: Secondary | ICD-10-CM

## 2019-11-26 DIAGNOSIS — O09519 Supervision of elderly primigravida, unspecified trimester: Secondary | ICD-10-CM | POA: Insufficient documentation

## 2019-11-26 DIAGNOSIS — N926 Irregular menstruation, unspecified: Secondary | ICD-10-CM

## 2019-11-26 DIAGNOSIS — O0991 Supervision of high risk pregnancy, unspecified, first trimester: Secondary | ICD-10-CM

## 2019-11-26 DIAGNOSIS — Z3A01 Less than 8 weeks gestation of pregnancy: Secondary | ICD-10-CM

## 2019-11-26 DIAGNOSIS — O0990 Supervision of high risk pregnancy, unspecified, unspecified trimester: Secondary | ICD-10-CM

## 2019-11-26 LAB — POCT URINE PREGNANCY: Preg Test, Ur: POSITIVE — AB

## 2019-11-26 NOTE — Progress Notes (Signed)
Patient was assessed and managed by nursing staff during this encounter. I have reviewed the chart and agree with the documentation and plan. I have also made any necessary editorial changes.  Catalina Antigua, MD 11/26/2019 9:30 AM

## 2019-11-26 NOTE — Telephone Encounter (Signed)
Noted  

## 2019-11-26 NOTE — Progress Notes (Signed)
Ms. Rashad presents today for UPT. She has no unusual complaints. LMP: 10/13/19 (approximately) pt has PCOS    OBJECTIVE: Appears well, in no apparent distress.  OB History    Gravida  1   Para  0   Term  0   Preterm  0   AB  0   Living  0     SAB  0   TAB  0   Ectopic  0   Multiple  0   Live Births  0          Home UPT Result: Positive  In-Office UPT result: Positive  I have reviewed the patient's medical, obstetrical, social, and family histories, and medications with provider.   ASSESSMENT: Positive pregnancy test  PLAN: Prenatal care to be completed at:  [facility] intake with u/s to confirm dating  Babyscripts explained in detail

## 2019-11-26 NOTE — Telephone Encounter (Signed)
Patient called in to let you know that she just found out she is pregnant and cannot get the epidural.

## 2019-11-26 NOTE — Addendum Note (Signed)
Addended by: Dalphine Handing on: 11/26/2019 10:06 AM   Modules accepted: Orders

## 2019-12-04 ENCOUNTER — Telehealth: Payer: Self-pay | Admitting: Internal Medicine

## 2019-12-04 NOTE — Telephone Encounter (Signed)
Accidentally signed off labs when cursor jumped in lab mailbox. Sending report to patient this way instead. Her Kidney function is stable to a bit improved Her A1C was improved at 6.0%. It is very important she get her bp down.  Would like for her to come back next week for resting BP check.    She was supposed to have a UA done, but do not see results in chart and unable to close as no results mapped to order--please check on that please and let me know verbally.

## 2019-12-07 ENCOUNTER — Encounter (HOSPITAL_COMMUNITY): Payer: Self-pay | Admitting: Obstetrics and Gynecology

## 2019-12-07 ENCOUNTER — Other Ambulatory Visit: Payer: Self-pay

## 2019-12-07 ENCOUNTER — Inpatient Hospital Stay (HOSPITAL_COMMUNITY): Payer: Medicaid Other

## 2019-12-07 ENCOUNTER — Inpatient Hospital Stay (HOSPITAL_COMMUNITY)
Admission: AD | Admit: 2019-12-07 | Discharge: 2019-12-07 | Disposition: A | Discharge status: Home / Self Care | Payer: Medicaid Other | Attending: Obstetrics and Gynecology | Admitting: Obstetrics and Gynecology

## 2019-12-07 DIAGNOSIS — O26891 Other specified pregnancy related conditions, first trimester: Secondary | ICD-10-CM | POA: Insufficient documentation

## 2019-12-07 DIAGNOSIS — O26851 Spotting complicating pregnancy, first trimester: Secondary | ICD-10-CM | POA: Insufficient documentation

## 2019-12-07 DIAGNOSIS — O209 Hemorrhage in early pregnancy, unspecified: Secondary | ICD-10-CM

## 2019-12-07 DIAGNOSIS — Z3A01 Less than 8 weeks gestation of pregnancy: Secondary | ICD-10-CM | POA: Insufficient documentation

## 2019-12-07 DIAGNOSIS — Z79899 Other long term (current) drug therapy: Secondary | ICD-10-CM | POA: Insufficient documentation

## 2019-12-07 DIAGNOSIS — O98512 Other viral diseases complicating pregnancy, second trimester: Secondary | ICD-10-CM | POA: Insufficient documentation

## 2019-12-07 DIAGNOSIS — Z7984 Long term (current) use of oral hypoglycemic drugs: Secondary | ICD-10-CM | POA: Diagnosis not present

## 2019-12-07 DIAGNOSIS — R7303 Prediabetes: Secondary | ICD-10-CM | POA: Insufficient documentation

## 2019-12-07 DIAGNOSIS — O26831 Pregnancy related renal disease, first trimester: Secondary | ICD-10-CM | POA: Diagnosis not present

## 2019-12-07 DIAGNOSIS — N189 Chronic kidney disease, unspecified: Secondary | ICD-10-CM | POA: Diagnosis not present

## 2019-12-07 DIAGNOSIS — I129 Hypertensive chronic kidney disease with stage 1 through stage 4 chronic kidney disease, or unspecified chronic kidney disease: Secondary | ICD-10-CM | POA: Insufficient documentation

## 2019-12-07 DIAGNOSIS — R102 Pelvic and perineal pain: Secondary | ICD-10-CM | POA: Diagnosis not present

## 2019-12-07 DIAGNOSIS — O99891 Other specified diseases and conditions complicating pregnancy: Secondary | ICD-10-CM | POA: Insufficient documentation

## 2019-12-07 DIAGNOSIS — O468X1 Other antepartum hemorrhage, first trimester: Secondary | ICD-10-CM

## 2019-12-07 LAB — CBC
HCT: 38.7 % (ref 36.0–46.0)
Hemoglobin: 12.5 g/dL (ref 12.0–15.0)
MCH: 29.6 pg (ref 26.0–34.0)
MCHC: 32.3 g/dL (ref 30.0–36.0)
MCV: 91.7 fL (ref 80.0–100.0)
Platelets: 188 10*3/uL (ref 150–400)
RBC: 4.22 MIL/uL (ref 3.87–5.11)
RDW: 14.1 % (ref 11.5–15.5)
WBC: 7.6 10*3/uL (ref 4.0–10.5)
nRBC: 0 % (ref 0.0–0.2)

## 2019-12-07 LAB — WET PREP, GENITAL
Clue Cells Wet Prep HPF POC: NONE SEEN
Sperm: NONE SEEN
Trich, Wet Prep: NONE SEEN
Yeast Wet Prep HPF POC: NONE SEEN

## 2019-12-07 LAB — HCG, QUANTITATIVE, PREGNANCY: hCG, Beta Chain, Quant, S: 13096 m[IU]/mL — ABNORMAL HIGH (ref <5)

## 2019-12-07 LAB — URINALYSIS, ROUTINE W REFLEX MICROSCOPIC
Bilirubin Urine: NEGATIVE
Glucose, UA: NEGATIVE mg/dL
Hgb urine dipstick: NEGATIVE
Ketones, ur: NEGATIVE mg/dL
Leukocytes,Ua: NEGATIVE
Nitrite: NEGATIVE
Protein, ur: NEGATIVE mg/dL
Specific Gravity, Urine: >1.03 — ABNORMAL HIGH (ref 1.005–1.030)
pH: 5.5 (ref 5.0–8.0)

## 2019-12-07 LAB — ABO/RH: ABO/RH(D): A POS

## 2019-12-07 IMAGING — US US OB < 14 WEEKS - US OB TV
1 series · 15 of 28 positions shown · non-contrast
Comparison: None.

CLINICAL DATA: Bleeding, right lower quadrant pain

EXAM:
OBSTETRIC <14 WK US AND TRANSVAGINAL OB US
TECHNIQUE: Both transabdominal and transvaginal ultrasound examinations were
performed for complete evaluation of the gestation as well as the
maternal uterus, adnexal regions, and pelvic cul-de-sac.
Transvaginal technique was performed to assess early pregnancy.

[Series 1: us ob < 14 weeks - us ob tv · 15 of 102 slices shown]
[im 1/102]
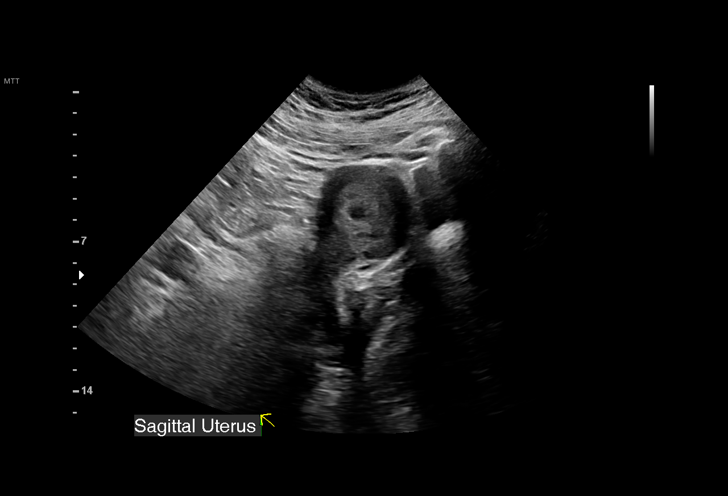
[im 8/102]
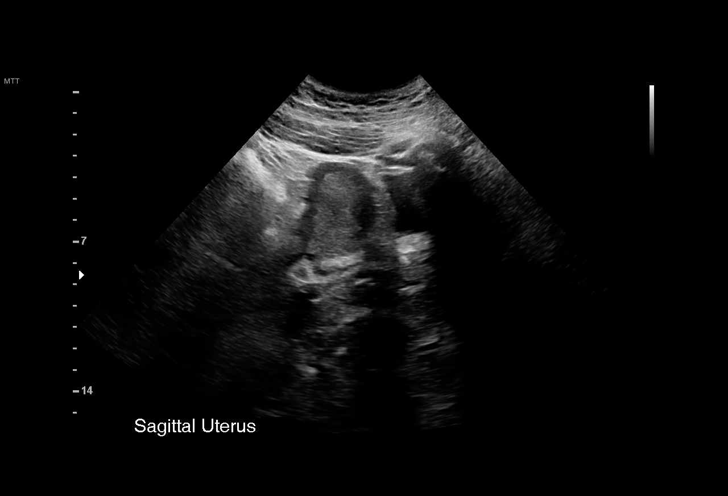
[im 15/102]
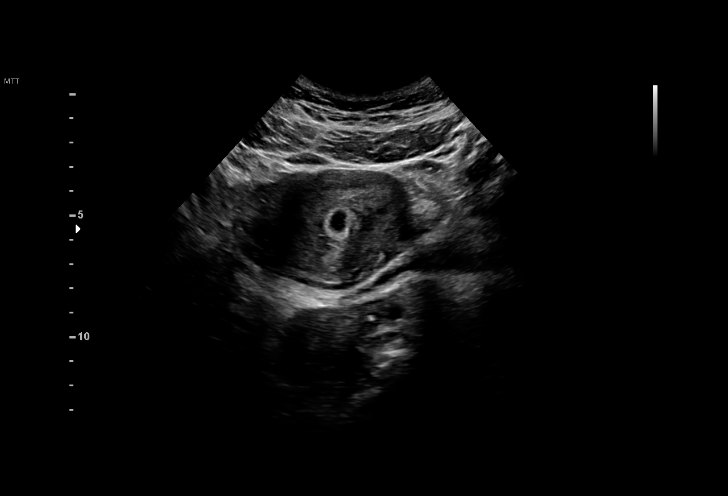
[im 23/102]
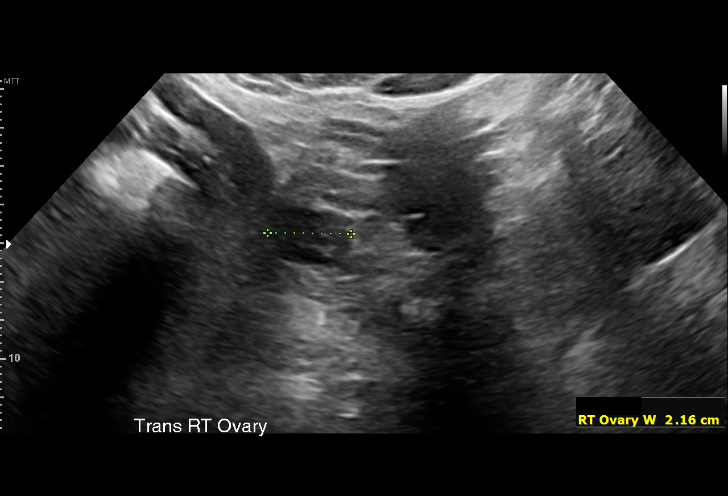
[im 30/102]
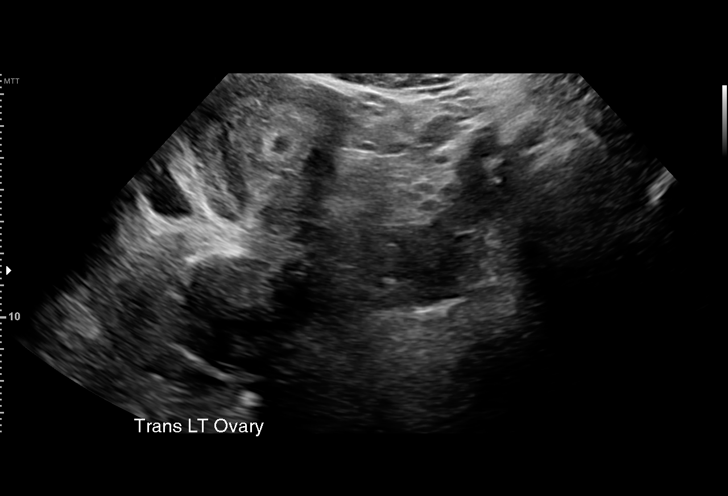
[im 38/102]
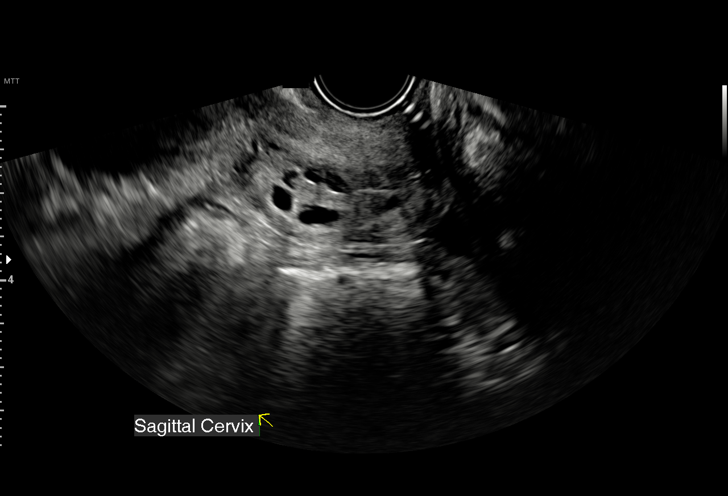
[im 45/102]
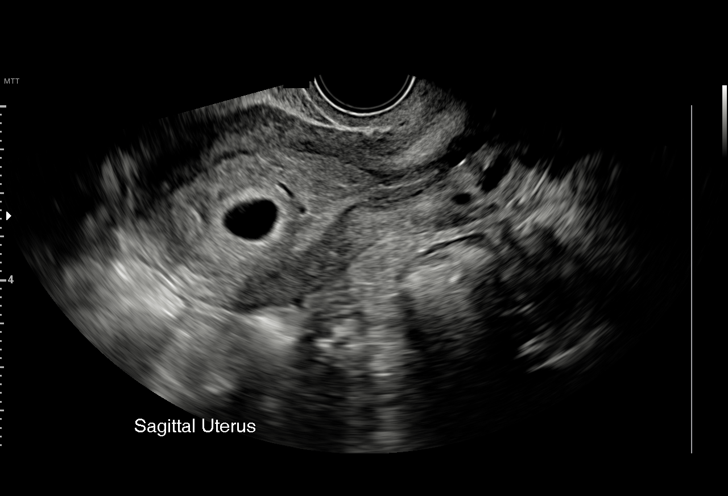
[im 53/102]
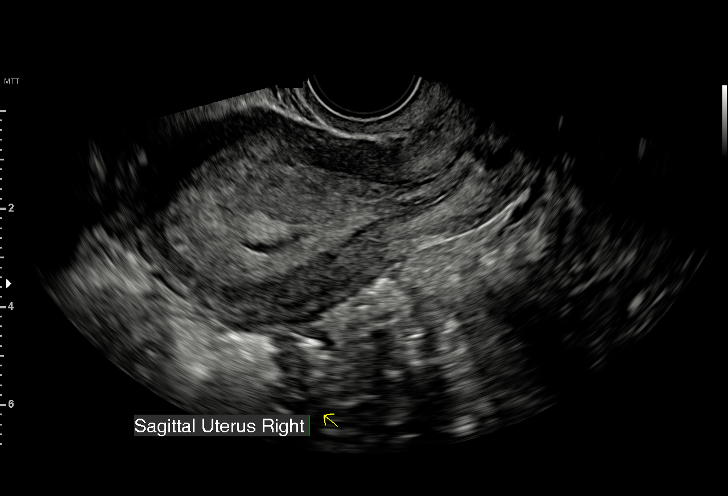
[im 57/102]
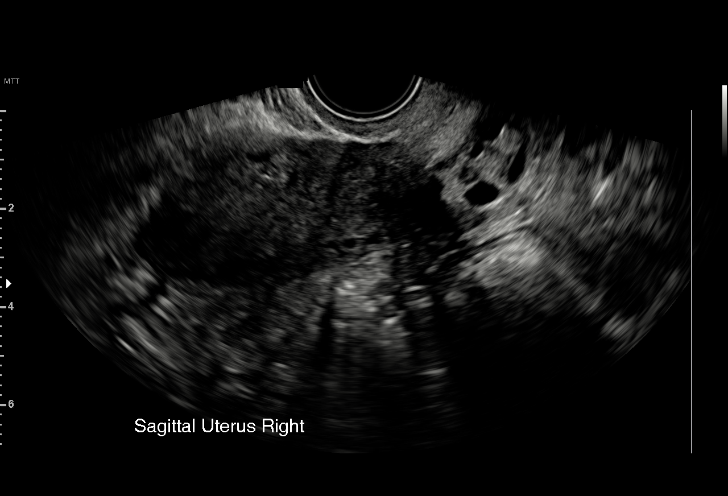
[im 64/102]
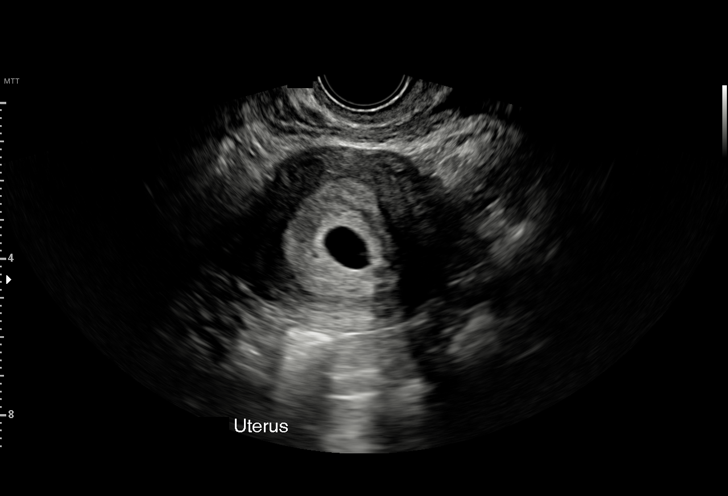
[im 72/102]
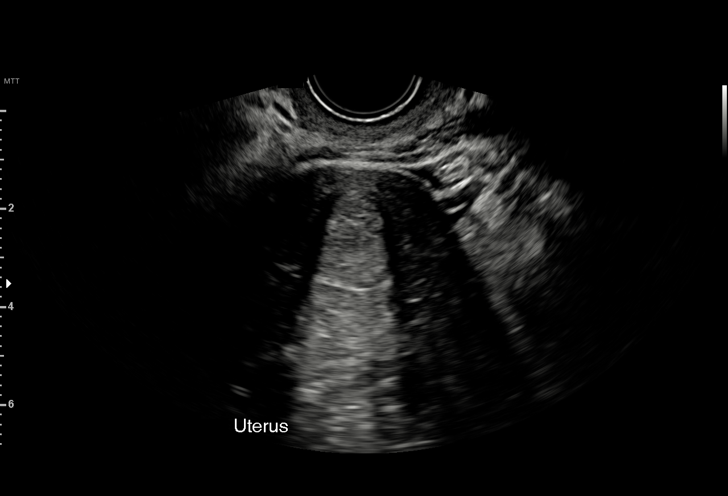
[im 79/102]
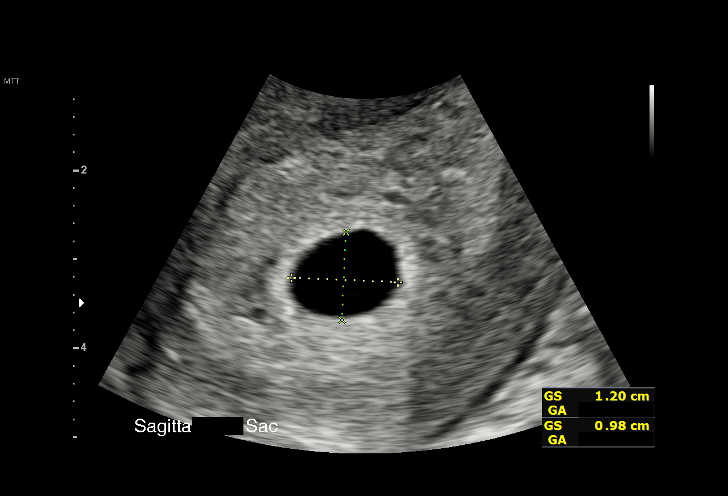
[im 87/102]
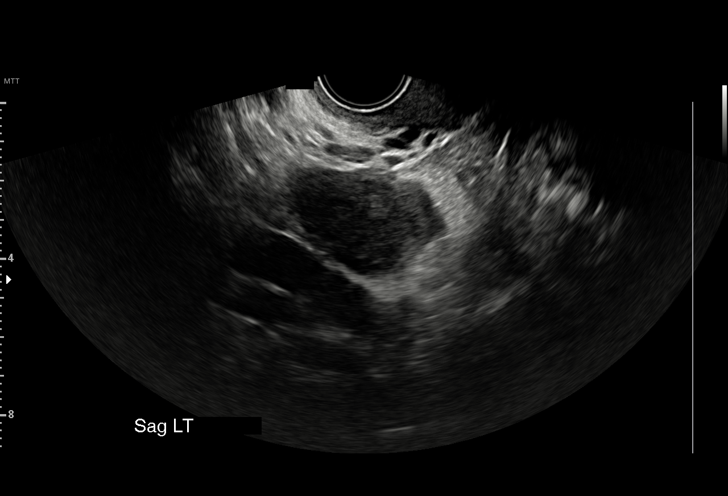
[im 94/102]
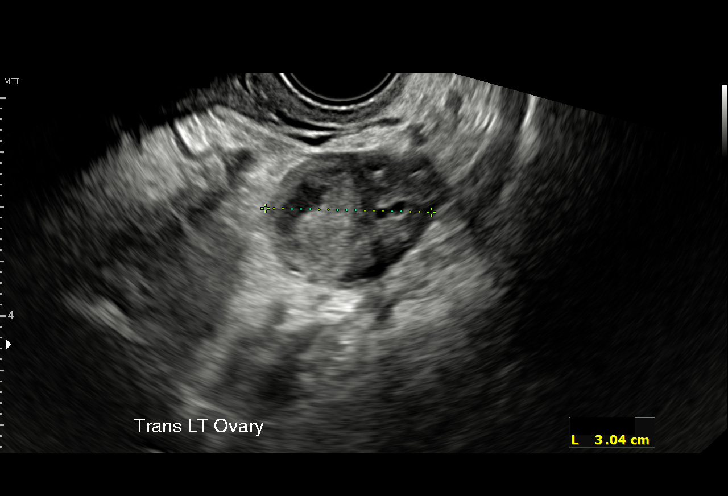
[im 102/102]
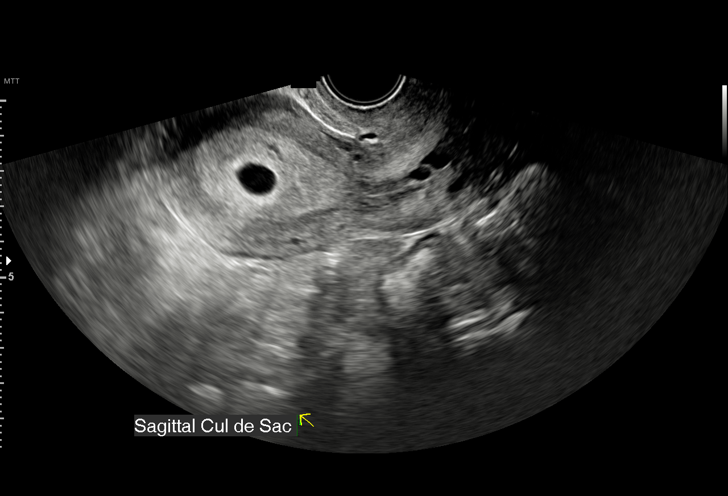

[15 of 28 positions shown; findings below may reference images not displayed]

FINDINGS: Intrauterine gestational sac: Single

Yolk sac:  Visualized

Embryo:  Not visualized

Cardiac Activity:

Heart Rate:   bpm

MSD: 10.7 mm   5 w   5 d

CRL:    mm    w    d                  US EDC:

Subchorionic hemorrhage:  Small subchorionic hemorrhage

Maternal uterus/adnexae: No adnexal mass.  Trace free fluid.
IMPRESSION: Five week 5 day intrauterine pregnancy based on mean sac diameter.
Yolk sac is present, but no fetal pole currently. This could be
followed with repeat ultrasound in 14 days to ensure expected
progression.

Small subchorionic hemorrhage.

## 2019-12-07 NOTE — MAU Provider Note (Signed)
Chief Complaint: Vaginal Bleeding and Abdominal Pain   First Provider Initiated Contact with Patient 12/07/19 0925     SUBJECTIVE HPI: Donna Burns is a 39 y.o. G1P0000 at [redacted]w[redacted]d who presents to Maternity Admissions reporting vaginal bleeding (spotting) and mild right sided pelvic pain that began after intercourse last night. Both have since subsided.   Past Medical History:  Diagnosis Date   Bilateral polycystic ovarian syndrome    Chronic kidney disease    HSV-2 (herpes simplex virus 2) infection    Hypertension 2008   Prediabetes    Vaginal Pap smear, abnormal    OB History  Gravida Para Term Preterm AB Living  1 0 0 0 0 0  SAB TAB Ectopic Multiple Live Births  0 0 0 0 0    # Outcome Date GA Lbr Len/2nd Weight Sex Delivery Anes PTL Lv  1 Current            Past Surgical History:  Procedure Laterality Date   KNEE SURGERY Right 2015   torn cartilage--arthroscopic   MOUTH SURGERY  2016   9 teeth pulled for decay-upper incisors.   Social History   Socioeconomic History   Marital status: Single    Spouse name: [name]   Number of children: 0   Years of education: Not on file   Highest education level: Bachelor's degree (e.g., BA, AB, BS)  Occupational History   Not on file  Tobacco Use   Smoking status: Never Smoker   Smokeless tobacco: Never Used  Substance and Sexual Activity   Alcohol use: Never   Drug use: Never   Sexual activity: Yes    Birth control/protection: None  Other Topics Concern   Not on file  Social History Narrative   Lives with husband in [place].   He is a Education officer, environmental   She is a Forensic scientist.   Social Determinants of Health   Financial Resource Strain:    Difficulty of Paying Living Expenses:   Food Insecurity: No Food Insecurity   Worried About Programme researcher, broadcasting/film/video in the Last Year: Never true   Ran Out of Food in the Last Year: Never true  Transportation Needs: No Transportation Needs    Lack of Transportation (Medical): No   Lack of Transportation (Non-Medical): No  Physical Activity:    Days of Exercise per Week:    Minutes of Exercise per Session:   Stress:    Feeling of Stress :   Social Connections:    Frequency of Communication with Friends and Family:    Frequency of Social Gatherings with Friends and Family:    Attends Religious Services:    Active Member of Clubs or Organizations:    Attends Engineer, structural:    Marital Status:   Intimate Partner Violence: Not At Risk   Fear of Current or Ex-Partner: No   Emotionally Abused: No   Physically Abused: No   Sexually Abused: No   Family History  Problem Relation Age of Onset   Heart failure Mother    Diabetes Mother    Hypertension Mother    Breast cancer Mother    Stroke Father        Nursing home bound   Hypertension Father    Dementia Father        from strokes   Other Sister        Prediabetes   Hypertension Sister    No current facility-administered medications on file prior to encounter.  Current Outpatient Medications on File Prior to Encounter  Medication Sig Dispense Refill   labetalol (NORMODYNE) 200 MG tablet 2 tabs by mouth twice daily. 120 tablet 11   NIFEdipine (PROCARDIA-XL/NIFEDICAL-XL) 30 MG 24 hr tablet Take 1 tablet (30 mg total) by mouth daily. 30 tablet 11   triamcinolone cream (KENALOG) 0.1 % Apply to affected areas twice daily as needed 80 g 1   acetaminophen-codeine (TYLENOL #3) 300-30 MG tablet Take 1 tablet by mouth 3 (three) times daily as needed for moderate pain. 30 tablet 1   acyclovir (ZOVIRAX) 400 MG tablet 1 tab by mouth 3 times daily for 5 days for outbreak (Patient not taking: Reported on 11/16/2019) 30 tablet 0   metFORMIN (GLUCOPHAGE) 500 MG tablet Take 1 tablet (500 mg total) by mouth 3 (three) times daily with meals. 90 tablet 5   methocarbamol (ROBAXIN) 500 MG tablet Take 1 tablet (500 mg total) by mouth 2 (two)  times daily as needed. (Patient not taking: Reported on 11/16/2019) 20 tablet 0   Podiatric Products (GOLD BOND FOOT) CREA Apply topically. As directed (Patient not taking: Reported on 11/16/2019)     predniSONE (STERAPRED UNI-PAK 21 TAB) 10 MG (21) TBPK tablet Take as directed (Patient not taking: Reported on 11/16/2019) 21 tablet 0   Tea Tree 100 % OIL Mix with Gold Bond Foot cream and apply all over dry flaky irritated areas of feet daily     Allergies  Allergen Reactions   Pork-Derived Products Other (See Comments)    Pt does not eat pork or use pork-derived products    I have reviewed patient's Past Medical Hx, Surgical Hx, Family Hx, Social Hx, medications and allergies.   Review of Systems  Constitutional: Negative.   HENT: Negative.   Eyes: Negative for photophobia and visual disturbance.  Respiratory: Negative for cough, chest tightness and shortness of breath.   Cardiovascular: Negative for chest pain and palpitations.  Gastrointestinal: Negative for abdominal pain, constipation, diarrhea, nausea and vomiting.  Endocrine: Negative.   Genitourinary: Positive for pelvic pain (right side, none now) and vaginal bleeding (spotting overnight that has stopped). Negative for vaginal discharge and vaginal pain.  Musculoskeletal: Positive for back pain (has ongoing back and knee pain from previous injury).  Skin: Negative.   Allergic/Immunologic: Negative.   Neurological: Negative for dizziness, syncope, light-headedness and headaches.  Hematological: Negative.   Psychiatric/Behavioral: Negative.     OBJECTIVE Patient Vitals for the past 24 hrs:  BP Temp Pulse Resp  12/07/19 1214 (!) 147/92 -- 90 16  12/07/19 0906 (!) 142/98 98.8 F (37.1 C) 95 16   Constitutional: Well-developed, well-nourished female in no acute distress.  Cardiovascular: normal rate & rhythm, no murmur Respiratory: normal rate and effort. Lung sounds clear throughout GI: Abd soft, non-tender, Pos BS x 4.  No guarding or rebound tenderness MS: Extremities nontender, no edema, normal ROM Neurologic: Alert and oriented x 4.  SPECULUM EXAM: NEFG, physiologic discharge, scant brown discharge around cervix noted, cervix closed.  LAB RESULTS Results for orders placed or performed during the hospital encounter of 12/07/19 (from the past 24 hour(s))  Wet prep, genital     Status: Abnormal   Collection Time: 12/07/19  9:26 AM   Specimen: PATH Cytology Cervicovaginal Ancillary Only  Result Value Ref Range   Yeast Wet Prep HPF POC NONE SEEN NONE SEEN   Trich, Wet Prep NONE SEEN NONE SEEN   Clue Cells Wet Prep HPF POC NONE SEEN NONE SEEN   WBC,  Wet Prep HPF POC MANY (A) NONE SEEN   Sperm NONE SEEN   Urinalysis, Routine w reflex microscopic     Status: Abnormal   Collection Time: 12/07/19 10:10 AM  Result Value Ref Range   Color, Urine YELLOW YELLOW   APPearance CLEAR CLEAR   Specific Gravity, Urine >1.030 (H) 1.005 - 1.030   pH 5.5 5.0 - 8.0   Glucose, UA NEGATIVE NEGATIVE mg/dL   Hgb urine dipstick NEGATIVE NEGATIVE   Bilirubin Urine NEGATIVE NEGATIVE   Ketones, ur NEGATIVE NEGATIVE mg/dL   Protein, ur NEGATIVE NEGATIVE mg/dL   Nitrite NEGATIVE NEGATIVE   Leukocytes,Ua NEGATIVE NEGATIVE  CBC     Status: None   Collection Time: 12/07/19 10:41 AM  Result Value Ref Range   WBC 7.6 4.0 - 10.5 K/uL   RBC 4.22 3.87 - 5.11 MIL/uL   Hemoglobin 12.5 12.0 - 15.0 g/dL   HCT 94.1 36 - 46 %   MCV 91.7 80.0 - 100.0 fL   MCH 29.6 26.0 - 34.0 pg   MCHC 32.3 30.0 - 36.0 g/dL   RDW 74.0 81.4 - 48.1 %   Platelets 188 150 - 400 K/uL   nRBC 0.0 0.0 - 0.2 %  ABO/Rh     Status: None   Collection Time: 12/07/19 10:41 AM  Result Value Ref Range   ABO/RH(D) A POS    No rh immune globuloin      NOT A RH IMMUNE GLOBULIN CANDIDATE, PT RH POSITIVE Performed at [facility], [address], [place], [place] [address]   hCG, quantitative, pregnancy     Status: Abnormal   Collection Time: 12/07/19 10:41  AM  Result Value Ref Range   hCG, Beta Chain, Quant, S 13,096 (H) <5 mIU/mL    IMAGING US OB LESS THAN 14 WEEKS WITH OB TRANSVAGINAL  Result Date: 12/07/2019 CLINICAL DATA:  Bleeding, right lower quadrant pain EXAM: OBSTETRIC <14 WK [place] AND TRANSVAGINAL OB US TECHNIQUE: Both transabdominal and transvaginal ultrasound examinations were performed for complete evaluation of the gestation as well as the maternal uterus, adnexal regions, and pelvic cul-de-sac. Transvaginal technique was performed to assess early pregnancy. COMPARISON:  None. FINDINGS: Intrauterine gestational sac: Single Yolk sac:  Visualized Embryo:  Not visualized Cardiac Activity: Heart Rate:   bpm MSD: 10.7 mm   5 w   5 d CRL:    mm    w    d                  [place] EDC: Subchorionic hemorrhage:  Small subchorionic hemorrhage Maternal uterus/adnexae: No adnexal mass.  Trace free fluid. IMPRESSION: Five week 5 day intrauterine pregnancy based on mean sac diameter. Yolk sac is present, but no fetal pole currently. This could be followed with repeat ultrasound in 14 days to ensure expected progression. Small subchorionic hemorrhage. Electronically Signed   By: Charlett Nose M.D.   On: 12/07/2019 11:34    MAU COURSE Orders Placed This Encounter  Procedures   Wet prep, genital   US OB LESS THAN 14 WEEKS WITH OB TRANSVAGINAL   Urinalysis, Routine w reflex microscopic   CBC   hCG, quantitative, pregnancy   ABO/Rh   Discharge patient   No orders of the defined types were placed in this encounter.   MDM Wet prep normal GC/CT pending UA, CBC, BHCG all normal CBC - normal Blood type A+ Discussed [place] results showing small SCH, advised pelvic rest for 72hrs  ASSESSMENT 1.  Subchorionic hemorrhage of placenta in first trimester, single or unspecified fetus   2. Vaginal bleeding in pregnancy, first trimester   3. [redacted] weeks gestation of pregnancy    PLAN Discharge home in stable condition.  Follow-up Information    [facility]. Go to.   Specialty: Obstetrics and Gynecology Why: as scheduled for prenatal care Contact information: [address], [address] [place] [id number] [phone]             Allergies as of 12/07/2019      Reactions   Pork-derived Products Other (See Comments)   Pt does not eat pork or use pork-derived products      Medication List    STOP taking these medications   medroxyPROGESTERone 10 MG tablet Commonly known as: PROVERA     TAKE these medications   acetaminophen-codeine 300-30 MG tablet Commonly known as: TYLENOL #3 Take 1 tablet by mouth 3 (three) times daily as needed for moderate pain.   acyclovir 400 MG tablet Commonly known as: ZOVIRAX 1 tab by mouth 3 times daily for 5 days for outbreak   Gold Bond Foot Crea Apply topically. As directed   labetalol 200 MG tablet Commonly known as: NORMODYNE 2 tabs by mouth twice daily.   metFORMIN 500 MG tablet Commonly known as: GLUCOPHAGE Take 1 tablet (500 mg total) by mouth 3 (three) times daily with meals.   methocarbamol 500 MG tablet Commonly known as: Robaxin Take 1 tablet (500 mg total) by mouth 2 (two) times daily as needed.   NIFEdipine 30 MG 24 hr tablet Commonly known as: PROCARDIA-XL/NIFEDICAL-XL Take 1 tablet (30 mg total) by mouth daily.   predniSONE 10 MG (21) Tbpk tablet Commonly known as: STERAPRED UNI-PAK 21 TAB Take as directed   Tea Tree 100 % Oil Mix with Gold Bond Foot cream and apply all over dry flaky irritated areas of feet daily   triamcinolone cream 0.1 % Commonly known as: KENALOG Apply to affected areas twice daily as needed       Bernerd LimboWalker, Macrae Wiegman R, CNM 12/07/2019  1:18 PM

## 2019-12-07 NOTE — Discharge Instructions (Signed)
Subchorionic Hematoma  A subchorionic hematoma is a gathering of blood between the outer wall of the embryo (chorion) and the inner wall of the womb (uterus). This condition can cause vaginal bleeding. If they cause little or no vaginal bleeding, early small hematomas usually shrink on their own and do not affect your baby or pregnancy. When bleeding starts later in pregnancy, or if the hematoma is larger or occurs in older pregnant women, the condition may be more serious. Larger hematomas may get bigger, which increases the chances of miscarriage. This condition also increases the risk of:  Premature separation of the placenta from the uterus.  Premature (preterm) labor.  Stillbirth. What are the causes? The exact cause of this condition is not known. It occurs when blood is trapped between the placenta and the uterine wall because the placenta has separated from the original site of implantation. What increases the risk? You are more likely to develop this condition if:  You were treated with fertility medicines.  You conceived through in vitro fertilization (IVF). What are the signs or symptoms? Symptoms of this condition include:  Vaginal spotting or bleeding.  Contractions of the uterus. These cause abdominal pain. Sometimes you may have no symptoms and the bleeding may only be seen when ultrasound images are taken (transvaginal ultrasound). How is this diagnosed? This condition is diagnosed based on a physical exam. This includes a pelvic exam. You may also have other tests, including:  Blood tests.  Urine tests.  Ultrasound of the abdomen. How is this treated? Treatment for this condition can vary. Treatment may include:  Watchful waiting. You will be monitored closely for any changes in bleeding. During this stage: ? The hematoma may be reabsorbed by the body. ? The hematoma may separate the fluid-filled space containing the embryo (gestational sac) from the wall of the  womb (endometrium).  Medicines.  Activity restriction. This may be needed until the bleeding stops. Follow these instructions at home:  Avoid sexual intercourse for 72hrs post vaginal bleeding event  Do not lift anything that is heavier than 10 lbs. (4.5 kg) or as told by your health care provider.  Do not use any products that contain nicotine or tobacco, such as cigarettes and e-cigarettes. If you need help quitting, ask your health care provider.  Track and write down the number of pads you use each day and how soaked (saturated) they are.  Do not use tampons.  Keep all follow-up visits as told by your health care provider. This is important. Your health care provider may ask you to have follow-up blood tests or ultrasound tests or both. Contact a health care provider if:  You have any vaginal bleeding.  You have a fever. Get help right away if:  You have severe cramps in your stomach, back, abdomen, or pelvis.  You pass large clots or tissue. Save any tissue for your health care provider to look at.  You have more vaginal bleeding, and you faint or become lightheaded or weak. Summary  A subchorionic hematoma is a gathering of blood between the outer wall of the placenta and the uterus.  This condition can cause vaginal bleeding.  Sometimes you may have no symptoms and the bleeding may only be seen when ultrasound images are taken.  Treatment may include watchful waiting, medicines, or activity restriction. This information is not intended to replace advice given to you by your health care provider. Make sure you discuss any questions you have with your health care  provider. Document Revised: 04/25/2017 Document Reviewed: 07/09/2016 Elsevier Patient Education  2020 [employer].  First Trimester of Pregnancy  The first trimester of pregnancy is from week 1 until the end of week 13 (months 1 through 3). During this time, your baby will begin to develop inside you. At  6-8 weeks, the eyes and face are formed, and the heartbeat can be seen on ultrasound. At the end of 12 weeks, all the baby's organs are formed. Prenatal care is all the medical care you receive before the birth of your baby. Make sure you get good prenatal care and follow all of your doctor's instructions. Follow these instructions at home: Medicines  Take over-the-counter and prescription medicines only as told by your doctor. Some medicines are safe and some medicines are not safe during pregnancy.  Take a prenatal vitamin that contains at least 600 micrograms (mcg) of folic acid.  If you have trouble pooping (constipation), take medicine that will make your stool soft (stool softener) if your doctor approves. Eating and drinking   Eat regular, healthy meals.  Your doctor will tell you the amount of weight gain that is right for you.  Avoid raw meat and uncooked cheese.  If you feel sick to your stomach (nauseous) or throw up (vomit): ? Eat 4 or 5 small meals a day instead of 3 large meals. ? Try eating a few soda crackers. ? Drink liquids between meals instead of during meals.  To prevent constipation: ? Eat foods that are high in fiber, like fresh fruits and vegetables, whole grains, and beans. ? Drink enough fluids to keep your pee (urine) clear or pale yellow. Activity  Exercise only as told by your doctor. Stop exercising if you have cramps or pain in your lower belly (abdomen) or low back.  Do not exercise if it is too hot, too humid, or if you are in a place of great height (high altitude).  Try to avoid standing for long periods of time. Move your legs often if you must stand in one place for a long time.  Avoid heavy lifting.  Wear low-heeled shoes. Sit and stand up straight.  You can have sex unless your doctor tells you not to. Relieving pain and discomfort  Wear a good support bra if your breasts are sore.  Take warm water baths (sitz baths) to soothe pain or  discomfort caused by hemorrhoids. Use hemorrhoid cream if your doctor says it is okay.  Rest with your legs raised if you have leg cramps or low back pain.  If you have puffy, bulging veins (varicose veins) in your legs: ? Wear support hose or compression stockings as told by your doctor. ? Raise (elevate) your feet for 15 minutes, 3-4 times a day. ? Limit salt in your food. Prenatal care  Schedule your prenatal visits by the twelfth week of pregnancy.  Write down your questions. Take them to your prenatal visits.  Keep all your prenatal visits as told by your doctor. This is important. Safety  Wear your seat belt at all times when driving.  Make a list of emergency phone numbers. The list should include numbers for family, friends, the hospital, and police and fire departments. General instructions  Ask your doctor for a referral to a local prenatal class. Begin classes no later than at the start of month 6 of your pregnancy.  Ask for help if you need counseling or if you need help with nutrition. Your doctor can give you  advice or tell you where to go for help.  Do not use hot tubs, steam rooms, or saunas.  Do not douche or use tampons or scented sanitary pads.  Do not cross your legs for long periods of time.  Avoid all herbs and alcohol. Avoid drugs that are not approved by your doctor.  Do not use any tobacco products, including cigarettes, chewing tobacco, and electronic cigarettes. If you need help quitting, ask your doctor. You may get counseling or other support to help you quit.  Avoid cat litter boxes and soil used by cats. These carry germs that can cause birth defects in the baby and can cause a loss of your baby (miscarriage) or stillbirth.  Visit your dentist. At home, brush your teeth with a soft toothbrush. Be gentle when you floss. Contact a doctor if:  You are dizzy.  You have mild cramps or pressure in your lower belly.  You have a nagging pain in your  belly area.  You continue to feel sick to your stomach, you throw up, or you have watery poop (diarrhea).  You have a bad smelling fluid coming from your vagina.  You have pain when you pee (urinate).  You have increased puffiness (swelling) in your face, hands, legs, or ankles. Get help right away if:  You have a fever.  You are leaking fluid from your vagina.  You have spotting or bleeding from your vagina.  You have very bad belly cramping or pain.  You gain or lose weight rapidly.  You throw up blood. It may look like coffee grounds.  You are around people who have [place] measles, fifth disease, or chickenpox.  You have a very bad headache.  You have shortness of breath.  You have any kind of trauma, such as from a fall or a car accident. Summary  The first trimester of pregnancy is from week 1 until the end of week 13 (months 1 through 3).  To take care of yourself and your unborn baby, you will need to eat healthy meals, take medicines only if your doctor tells you to do so, and do activities that are safe for you and your baby.  Keep all follow-up visits as told by your doctor. This is important as your doctor will have to ensure that your baby is healthy and growing well. This information is not intended to replace advice given to you by your health care provider. Make sure you discuss any questions you have with your health care provider. Document Revised: 09/03/2018 Document Reviewed: 05/21/2016 Elsevier Patient Education  2020 [employer].

## 2019-12-07 NOTE — MAU Note (Signed)
.   Donna Burns is a 39 y.o. at [redacted]w[redacted]d here in MAU reporting: vaginal bleeding when she wiped this morning. Pt states she had intercourse last night and has had pain in her right lower abdomen since.  LMP: 10/13/19 Onset of complaint: last night Pain score: 7 Vitals:   12/07/19 0906  BP: (!) 142/98  Pulse: 95  Resp: 16  Temp: 98.8 F (37.1 C)     FHT: Lab orders placed from triage:UA

## 2019-12-08 LAB — GC/CHLAMYDIA PROBE AMP (~~LOC~~) NOT AT ARMC
Chlamydia: NEGATIVE
Comment: NEGATIVE
Comment: NORMAL
Neisseria Gonorrhea: NEGATIVE

## 2019-12-23 ENCOUNTER — Ambulatory Visit (INDEPENDENT_AMBULATORY_CARE_PROVIDER_SITE_OTHER): Payer: Medicaid Other

## 2019-12-23 ENCOUNTER — Other Ambulatory Visit: Payer: Self-pay

## 2019-12-23 VITALS — BP 155/98 | HR 76

## 2019-12-23 DIAGNOSIS — Z3A08 8 weeks gestation of pregnancy: Secondary | ICD-10-CM

## 2019-12-23 DIAGNOSIS — O3680X0 Pregnancy with inconclusive fetal viability, not applicable or unspecified: Secondary | ICD-10-CM

## 2019-12-23 DIAGNOSIS — O09519 Supervision of elderly primigravida, unspecified trimester: Secondary | ICD-10-CM

## 2019-12-23 DIAGNOSIS — O09511 Supervision of elderly primigravida, first trimester: Secondary | ICD-10-CM

## 2019-12-23 NOTE — Progress Notes (Signed)
Patient presents for viability ultrasound. Patient has ultrasound completed on 12/07/19 with recommendations for a repeat u/s in 14 days. Gestational sac and yolk sac was seen at that time. No fetal pole was seen.  DATING AND VIABILITY SONOGRAM   Donna Burns is a 39 y.o. year old G1P0000 with LMP Patient's last menstrual period was 10/06/2019 (exact date). which would correlate to  [redacted]w[redacted]d weeks gestation.  She has irregular menstrual cycles.   She is here today for a confirmatory initial sonogram.    GESTATION: [redacted]w[redacted]d SINGLETON pregnancy    FETAL ACTIVITY:          Heart rate         166          The fetus is inactive.  Gestational criteria: Estimated Date of Delivery: 08/03/20 by early ultrasound now at [redacted]w[redacted]d  Previous Scans:1  GESTATIONAL SAC                  7 wks 2d  CROWN RUMP LENGTH                  7wks 4d                                                                               AVERAGE EGA(BY THIS SCAN):  7 wks 4d  WORKING EDD( early ultrasound ):  08/04/19     TECHNICIAN COMMENTS:  Single Live IUP measuring [redacted]w[redacted]d by CRL. FHR 166   A copy of this report including all images has been saved and backed up to a second source for retrieval if needed. All measures and details of the anatomical scan, placentation, fluid volume and pelvic anatomy are contained in that report.  Donna Burns 12/23/2019 10:20 AM

## 2019-12-31 ENCOUNTER — Encounter: Payer: No Typology Code available for payment source | Admitting: Obstetrics and Gynecology

## 2020-01-05 DIAGNOSIS — O09529 Supervision of elderly multigravida, unspecified trimester: Secondary | ICD-10-CM | POA: Insufficient documentation

## 2020-01-05 DIAGNOSIS — O99210 Obesity complicating pregnancy, unspecified trimester: Secondary | ICD-10-CM | POA: Insufficient documentation

## 2020-01-14 NOTE — Telephone Encounter (Signed)
Spoke with patient. Results given. Was hard getting a hold of patient because she stated she called 3 months ago and left message with Britt Boozer that she was pregnant and OB-GYN stated she will follow her until after her pregnancy then she can go back thru her PCP. Patient states she will have everything done thru her OB-GYN

## 2020-01-18 ENCOUNTER — Encounter: Payer: Medicaid Other | Admitting: Obstetrics and Gynecology

## 2020-01-24 ENCOUNTER — Other Ambulatory Visit: Payer: Self-pay

## 2020-01-24 DIAGNOSIS — M549 Dorsalgia, unspecified: Secondary | ICD-10-CM | POA: Diagnosis not present

## 2020-01-24 DIAGNOSIS — N08 Glomerular disorders in diseases classified elsewhere: Secondary | ICD-10-CM | POA: Diagnosis not present

## 2020-01-24 DIAGNOSIS — Z113 Encounter for screening for infections with a predominantly sexual mode of transmission: Secondary | ICD-10-CM | POA: Diagnosis not present

## 2020-01-24 DIAGNOSIS — E282 Polycystic ovarian syndrome: Secondary | ICD-10-CM | POA: Diagnosis not present

## 2020-01-24 DIAGNOSIS — O09511 Supervision of elderly primigravida, first trimester: Secondary | ICD-10-CM | POA: Diagnosis not present

## 2020-01-24 DIAGNOSIS — Z369 Encounter for antenatal screening, unspecified: Secondary | ICD-10-CM | POA: Diagnosis not present

## 2020-01-24 DIAGNOSIS — Z3A12 12 weeks gestation of pregnancy: Secondary | ICD-10-CM | POA: Diagnosis not present

## 2020-01-24 DIAGNOSIS — O09521 Supervision of elderly multigravida, first trimester: Secondary | ICD-10-CM | POA: Diagnosis not present

## 2020-01-24 DIAGNOSIS — N289 Disorder of kidney and ureter, unspecified: Secondary | ICD-10-CM | POA: Insufficient documentation

## 2020-01-24 DIAGNOSIS — O99211 Obesity complicating pregnancy, first trimester: Secondary | ICD-10-CM | POA: Diagnosis not present

## 2020-01-24 DIAGNOSIS — Z368A Encounter for antenatal screening for other genetic defects: Secondary | ICD-10-CM | POA: Diagnosis not present

## 2020-01-24 DIAGNOSIS — O10011 Pre-existing essential hypertension complicating pregnancy, first trimester: Secondary | ICD-10-CM | POA: Diagnosis not present

## 2020-01-24 DIAGNOSIS — O3680X0 Pregnancy with inconclusive fetal viability, not applicable or unspecified: Secondary | ICD-10-CM | POA: Diagnosis not present

## 2020-01-26 ENCOUNTER — Other Ambulatory Visit: Payer: Self-pay

## 2020-01-26 DIAGNOSIS — N1831 Chronic kidney disease, stage 3a: Secondary | ICD-10-CM | POA: Insufficient documentation

## 2020-02-04 DIAGNOSIS — O99211 Obesity complicating pregnancy, first trimester: Secondary | ICD-10-CM | POA: Diagnosis not present

## 2020-02-04 DIAGNOSIS — O139 Gestational [pregnancy-induced] hypertension without significant proteinuria, unspecified trimester: Secondary | ICD-10-CM | POA: Diagnosis not present

## 2020-02-09 ENCOUNTER — Ambulatory Visit: Payer: Medicaid Other | Admitting: *Deleted

## 2020-02-09 ENCOUNTER — Other Ambulatory Visit: Payer: Self-pay

## 2020-02-09 ENCOUNTER — Ambulatory Visit: Payer: Medicaid Other | Attending: Obstetrics and Gynecology | Admitting: Obstetrics and Gynecology

## 2020-02-09 ENCOUNTER — Other Ambulatory Visit: Payer: Self-pay | Admitting: *Deleted

## 2020-02-09 VITALS — BP 131/87 | HR 78

## 2020-02-09 DIAGNOSIS — A6000 Herpesviral infection of urogenital system, unspecified: Secondary | ICD-10-CM | POA: Insufficient documentation

## 2020-02-09 DIAGNOSIS — O10919 Unspecified pre-existing hypertension complicating pregnancy, unspecified trimester: Secondary | ICD-10-CM

## 2020-02-09 DIAGNOSIS — E282 Polycystic ovarian syndrome: Secondary | ICD-10-CM | POA: Insufficient documentation

## 2020-02-09 DIAGNOSIS — O26832 Pregnancy related renal disease, second trimester: Secondary | ICD-10-CM

## 2020-02-09 DIAGNOSIS — O10912 Unspecified pre-existing hypertension complicating pregnancy, second trimester: Secondary | ICD-10-CM | POA: Insufficient documentation

## 2020-02-09 DIAGNOSIS — O99212 Obesity complicating pregnancy, second trimester: Secondary | ICD-10-CM | POA: Diagnosis not present

## 2020-02-09 DIAGNOSIS — Z3A14 14 weeks gestation of pregnancy: Secondary | ICD-10-CM | POA: Insufficient documentation

## 2020-02-09 DIAGNOSIS — E669 Obesity, unspecified: Secondary | ICD-10-CM | POA: Insufficient documentation

## 2020-02-09 DIAGNOSIS — Z7982 Long term (current) use of aspirin: Secondary | ICD-10-CM | POA: Diagnosis not present

## 2020-02-09 DIAGNOSIS — O99282 Endocrine, nutritional and metabolic diseases complicating pregnancy, second trimester: Secondary | ICD-10-CM | POA: Diagnosis not present

## 2020-02-09 DIAGNOSIS — O98312 Other infections with a predominantly sexual mode of transmission complicating pregnancy, second trimester: Secondary | ICD-10-CM | POA: Diagnosis not present

## 2020-02-09 DIAGNOSIS — O09512 Supervision of elderly primigravida, second trimester: Secondary | ICD-10-CM | POA: Diagnosis not present

## 2020-02-09 DIAGNOSIS — Z7984 Long term (current) use of oral hypoglycemic drugs: Secondary | ICD-10-CM | POA: Insufficient documentation

## 2020-02-10 DIAGNOSIS — R7309 Other abnormal glucose: Secondary | ICD-10-CM | POA: Diagnosis not present

## 2020-02-14 NOTE — Progress Notes (Signed)
MFM Consult  Donna Burns is a 39 year old gravida 1 para 0 with an EDC of August 06, 2020. She is currently at 14 weeks and 3 days.  She was seen in consultation today due to advanced maternal age, chronic hypertension, polycystic ovarian syndrome, and possible kidney disease.  She reports that she was diagnosed with chronic hypertension about 5 to 6 years ago.  She is currently treated with labetalol 200 mg twice a day and nifedipine 60 mg daily.  She was treated with Metformin for PCOS.  She reports that her internal medicine doctor is weaning her off Metformin.  She was taking three 500 mg tablets of Metformin daily however she is now down to one 500 mg tablet daily.  Her most recent serum creatinine level drawn on January 26, 2020 was 1.15.  The patient reports that her serum creatinine levels were as high as 1.55 last year.  Due to possible kidney disease, she is scheduled to see a nephrologist soon.  She reports that she had a 24-hour urine collected recently that showed 120 mg of protein.  She also has a history of genital herpes.  The patient reports that she had a cell free DNA test drawn earlier in her pregnancy which indicated a low risk for trisomy 12, 56, and 13.  A female fetus is predicted.  Due to obesity and her history of PCOS she underwent an early screening test for gestational diabetes.  She reports that she recently failed her 1 hour glucose screening test.  She is scheduled for a 3-hour glucose tolerance test tomorrow.  She had an ultrasound performed earlier in her pregnancy which confirmed an [facility] of August 06, 2020.  Her past surgical history includes oral surgery and knee surgery.    She denies any alcohol tobacco or illegal drug use.    During our consultation, the following issues were discussed:  Chronic hypertension in pregnancy The implications and management of chronic hypertension in pregnancy was discussed.  She was advised to continue taking labetalol and  nifedipine as prescribed for blood pressure control.  She should continue to monitor her blood pressures at home.  The goal would be to maintain her blood pressures in the 130s to 140s over 80s to 90s range.  Should her blood pressures be elevated above this range (which may occur later in her pregnancy), the dosage of her antihypertensive medications may need to be increased.  The increased risk of superimposed preeclampsia, an indicated preterm delivery, and possible fetal growth restriction due to chronic hypertension in pregnancy was discussed. We will continue to follow her with monthly growth scans. Weekly fetal testing should be started at around 32 weeks.   To decrease her risk of superimposed preeclampsia, she should continue taking a daily baby aspirin (81 mg daily) for preeclampsia prophylaxis.  She understands that should she develop superimposed preeclampsia, that delivery at between 34 to 37 weeks depending on her clinical status may be recommended.  Possible renal disease in pregnancy The patient's serum creatinine levels are elevated.  However, her 24-hour urine did not indicate significant proteinuria.  The patient was advised that women with renal disease in pregnancy are at increased risk for fetal growth restriction and developing preeclampsia.  We will continue to follow her closely with serial growth ultrasounds throughout her pregnancy.  She should keep her appointment with the nephrologist.  Advanced maternal age in pregnancy The increased risk of fetal aneuploidy due to advanced maternal age was discussed.  She was reassured  that based on her negative cell free DNA test, that her risk of having a baby with Down syndrome should be low.  We will assess for markers associated with Down syndrome during her fetal anatomy scan.  She understands that definitive diagnosis of fetal aneuploidy can only be made through an invasive procedure such as a CVS or amniocentesis.  As she will be over  the age of 86, weekly fetal testing should be started at around 36 weeks.    Possible gestational diabetes She is scheduled for her 3-hour glucose tolerance test tomorrow.  The implications and management of diabetes in pregnancy should she be diagnosed with this condition was discussed.  She was advised that should she be diagnosed with gestational diabetes, treatment will begin with diet modification and she will have to monitor her serum glucose levels through fingersticks (4 times daily-fasting and 2 hours after each meal).  She is scheduled to meet with a nutritionist soon. She was advised that our goals for her fingerstick values are fasting values of 90-95 or less and two-hour postprandial values of 120 or less.  Should her fingerstick results be above these values, she may have to be started on insulin or Metformin to help her achieve better glycemic control. The patient was advised that getting her fingerstick values as close to these goals as possible would provide her with the most optimal obstetrical outcome.  Should she be diagnosed with early onset gestational diabetes, she should probably be referred for a fetal echocardiogram with pediatric cardiology.  History of herpes in pregnancy Due to her history of herpes, she should start Valtrex for prophylaxis at around 35 weeks.  She understands that should she have an active outbreak of herpes at the time of delivery, that she will require a cesarean delivery.  At the end of the consultation, the patient stated that all her questions had been answered to her complete satisfaction.  She was advised that I anticipate that with proper management and follow-up, I anticipate that she will have a successful pregnancy outcome.  Thank you for referring this patient for a Maternal-Fetal Medicine consultation.  Recommendations: Continue labetalol and nifedipine for treatment of hypertension Fetal anatomy scan scheduled at around 19 weeks Fetal  echocardiogram should she be diagnosed with early onset gestational diabetes Serial growth ultrasounds Weekly fetal testing to be started at 32 weeks Continue to monitor for signs and symptoms of preeclampsia Start Valtrex for herpes prophylaxis at around 35 weeks  Total time spent for consultation 45 minutes  This report was copied from an original document generated using [employer].

## 2020-02-18 DIAGNOSIS — I129 Hypertensive chronic kidney disease with stage 1 through stage 4 chronic kidney disease, or unspecified chronic kidney disease: Secondary | ICD-10-CM | POA: Diagnosis not present

## 2020-02-18 DIAGNOSIS — N183 Chronic kidney disease, stage 3 unspecified: Secondary | ICD-10-CM | POA: Diagnosis not present

## 2020-02-18 DIAGNOSIS — Z3490 Encounter for supervision of normal pregnancy, unspecified, unspecified trimester: Secondary | ICD-10-CM | POA: Diagnosis not present

## 2020-02-24 ENCOUNTER — Encounter (HOSPITAL_COMMUNITY): Payer: Self-pay | Admitting: Obstetrics and Gynecology

## 2020-02-24 ENCOUNTER — Other Ambulatory Visit: Payer: Self-pay

## 2020-02-24 ENCOUNTER — Inpatient Hospital Stay (HOSPITAL_COMMUNITY)
Admission: AD | Admit: 2020-02-24 | Discharge: 2020-02-24 | Disposition: A | Discharge status: Home / Self Care | Payer: Medicaid Other | Attending: Obstetrics and Gynecology | Admitting: Obstetrics and Gynecology

## 2020-02-24 DIAGNOSIS — O99891 Other specified diseases and conditions complicating pregnancy: Secondary | ICD-10-CM | POA: Diagnosis not present

## 2020-02-24 DIAGNOSIS — R7303 Prediabetes: Secondary | ICD-10-CM | POA: Diagnosis not present

## 2020-02-24 DIAGNOSIS — E282 Polycystic ovarian syndrome: Secondary | ICD-10-CM | POA: Insufficient documentation

## 2020-02-24 DIAGNOSIS — Z3A16 16 weeks gestation of pregnancy: Secondary | ICD-10-CM | POA: Diagnosis not present

## 2020-02-24 DIAGNOSIS — O10212 Pre-existing hypertensive chronic kidney disease complicating pregnancy, second trimester: Secondary | ICD-10-CM | POA: Insufficient documentation

## 2020-02-24 DIAGNOSIS — Z041 Encounter for examination and observation following transport accident: Secondary | ICD-10-CM | POA: Diagnosis present

## 2020-02-24 DIAGNOSIS — O99282 Endocrine, nutritional and metabolic diseases complicating pregnancy, second trimester: Secondary | ICD-10-CM | POA: Insufficient documentation

## 2020-02-24 DIAGNOSIS — O98512 Other viral diseases complicating pregnancy, second trimester: Secondary | ICD-10-CM | POA: Insufficient documentation

## 2020-02-24 DIAGNOSIS — I129 Hypertensive chronic kidney disease with stage 1 through stage 4 chronic kidney disease, or unspecified chronic kidney disease: Secondary | ICD-10-CM | POA: Insufficient documentation

## 2020-02-24 DIAGNOSIS — N189 Chronic kidney disease, unspecified: Secondary | ICD-10-CM | POA: Diagnosis not present

## 2020-02-24 DIAGNOSIS — O26832 Pregnancy related renal disease, second trimester: Secondary | ICD-10-CM | POA: Insufficient documentation

## 2020-02-24 DIAGNOSIS — O9A212 Injury, poisoning and certain other consequences of external causes complicating pregnancy, second trimester: Secondary | ICD-10-CM
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
## 2020-02-24 NOTE — Discharge Instructions (Signed)
Return to care  If you have heavier bleeding that soaks through more that 2 pads per hour for an hour or more If you bleed so much that you feel like you might pass out or you do pass out If you have significant abdominal pain that is not improved with Tylenol   

## 2020-03-05 ENCOUNTER — Inpatient Hospital Stay (HOSPITAL_COMMUNITY)
Admission: AD | Admit: 2020-03-05 | Discharge: 2020-03-05 | Disposition: A | Discharge status: Home / Self Care | Payer: Medicaid Other | Attending: Obstetrics and Gynecology | Admitting: Obstetrics and Gynecology

## 2020-03-05 ENCOUNTER — Other Ambulatory Visit: Payer: Self-pay

## 2020-03-05 ENCOUNTER — Inpatient Hospital Stay (HOSPITAL_BASED_OUTPATIENT_CLINIC_OR_DEPARTMENT_OTHER): Payer: Medicaid Other

## 2020-03-05 ENCOUNTER — Encounter (HOSPITAL_COMMUNITY): Payer: Self-pay | Admitting: Obstetrics and Gynecology

## 2020-03-05 DIAGNOSIS — O26832 Pregnancy related renal disease, second trimester: Secondary | ICD-10-CM | POA: Insufficient documentation

## 2020-03-05 DIAGNOSIS — N189 Chronic kidney disease, unspecified: Secondary | ICD-10-CM | POA: Insufficient documentation

## 2020-03-05 DIAGNOSIS — O4692 Antepartum hemorrhage, unspecified, second trimester: Secondary | ICD-10-CM

## 2020-03-05 DIAGNOSIS — Z3A18 18 weeks gestation of pregnancy: Secondary | ICD-10-CM | POA: Insufficient documentation

## 2020-03-05 DIAGNOSIS — O039 Complete or unspecified spontaneous abortion without complication: Secondary | ICD-10-CM | POA: Diagnosis not present

## 2020-03-05 DIAGNOSIS — O09522 Supervision of elderly multigravida, second trimester: Secondary | ICD-10-CM

## 2020-03-05 DIAGNOSIS — R58 Hemorrhage, not elsewhere classified: Secondary | ICD-10-CM | POA: Diagnosis not present

## 2020-03-05 DIAGNOSIS — O209 Hemorrhage in early pregnancy, unspecified: Secondary | ICD-10-CM | POA: Diagnosis not present

## 2020-03-05 DIAGNOSIS — I10 Essential (primary) hypertension: Secondary | ICD-10-CM | POA: Diagnosis not present

## 2020-03-05 LAB — WET PREP, GENITAL
Clue Cells Wet Prep HPF POC: NONE SEEN
Sperm: NONE SEEN
Trich, Wet Prep: NONE SEEN
Yeast Wet Prep HPF POC: NONE SEEN

## 2020-03-05 LAB — URINALYSIS, ROUTINE W REFLEX MICROSCOPIC
Bacteria, UA: NONE SEEN
Bilirubin Urine: NEGATIVE
Glucose, UA: NEGATIVE mg/dL
Ketones, ur: NEGATIVE mg/dL
Leukocytes,Ua: NEGATIVE
Nitrite: NEGATIVE
Protein, ur: NEGATIVE mg/dL
RBC / HPF: >50 RBC/hpf — ABNORMAL HIGH (ref 0–5)
Specific Gravity, Urine: 1.015 (ref 1.005–1.030)
pH: 5 (ref 5.0–8.0)

## 2020-03-05 IMAGING — US US MFM OB LIMITED
1 series · 15 of 24 positions shown · non-contrast
Comparison: none

[Series 1: us mfm ob limited · 24 acquisitions, 15 frames shown]
[im 1/24]
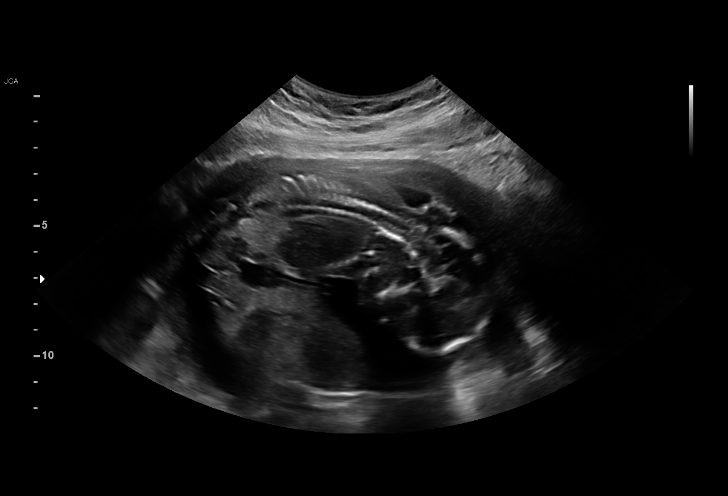
[im 3/24]
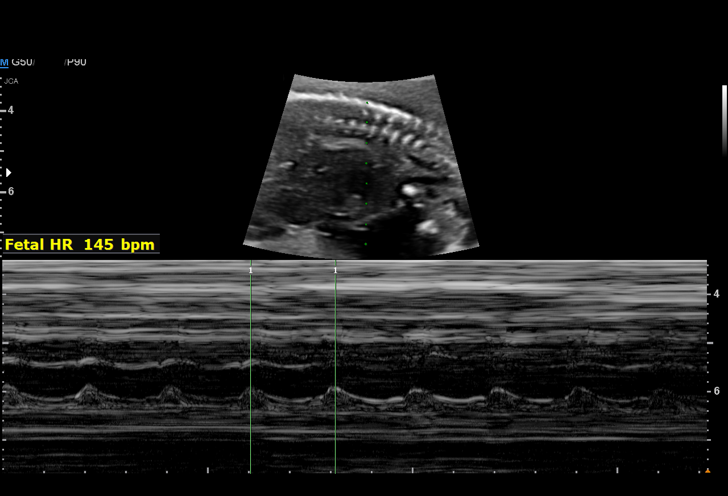
[im 5/24]
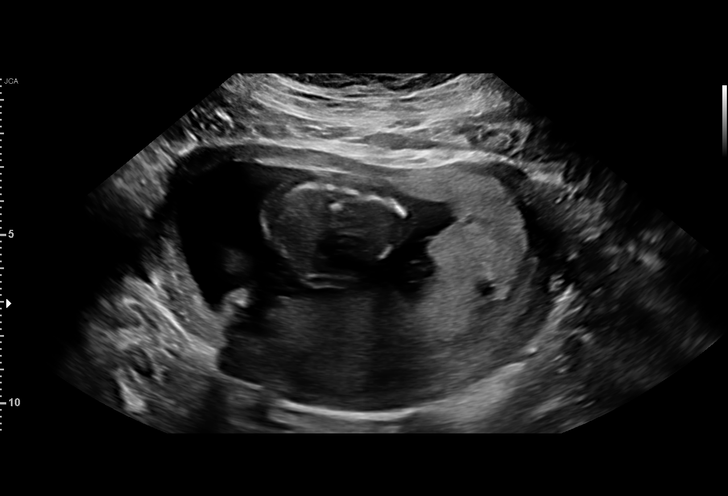
[im 6/24]
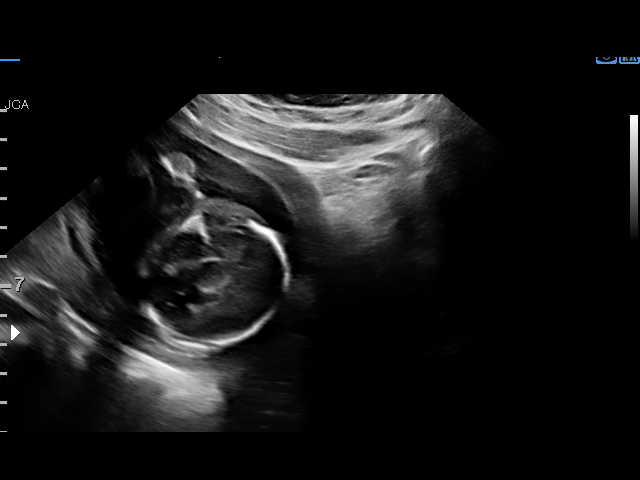
[im 8/24]
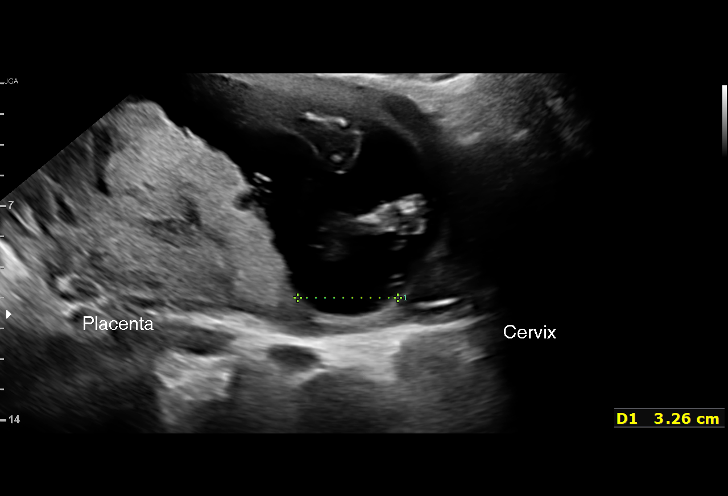
[im 9/24]
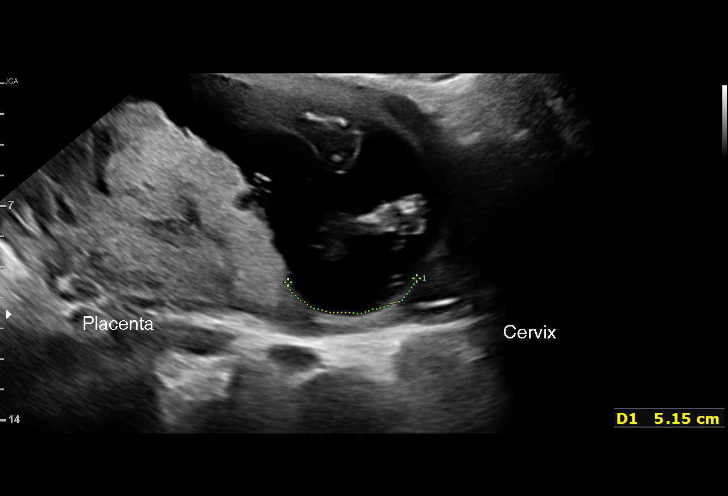
[im 11/24]
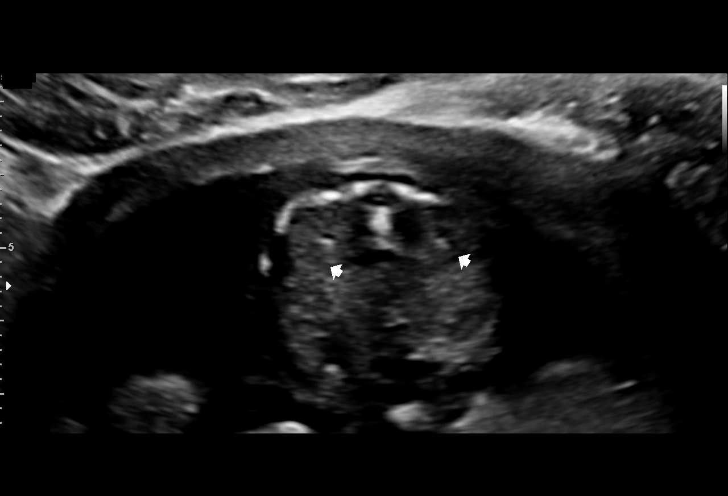
[im 13/24]
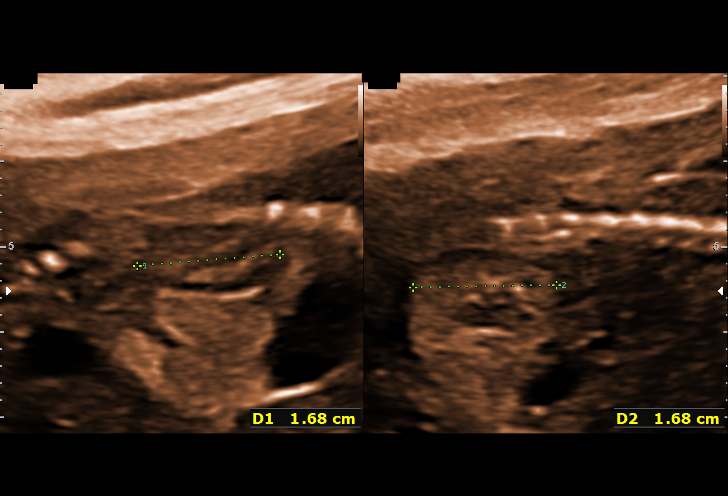
[im 14/24]
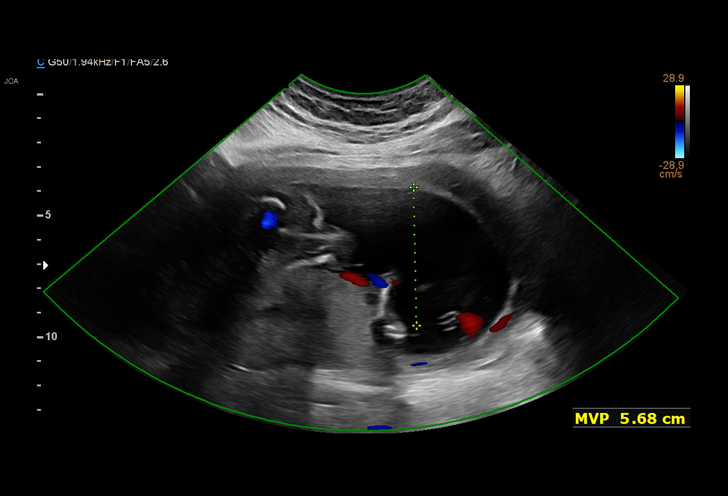
[im 16/24]
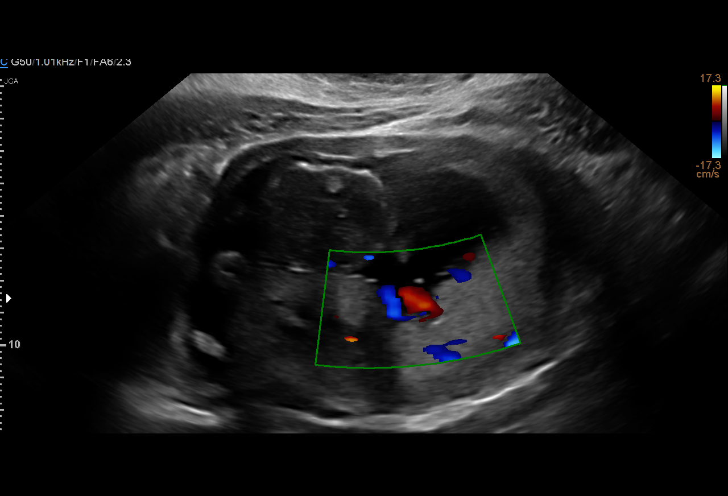
[im 17/24]
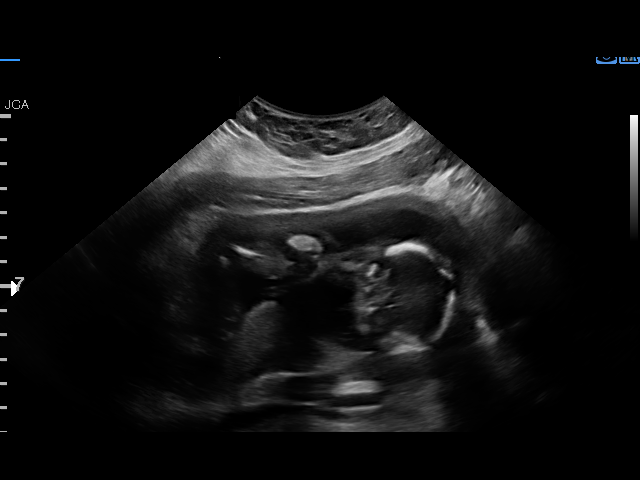
[im 19/24]
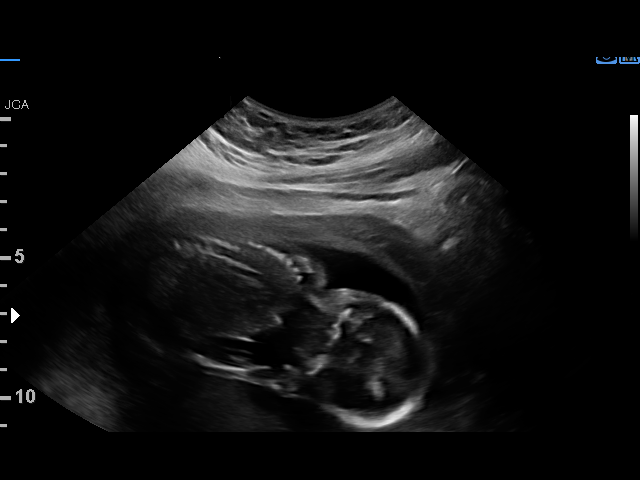
[im 21/24]
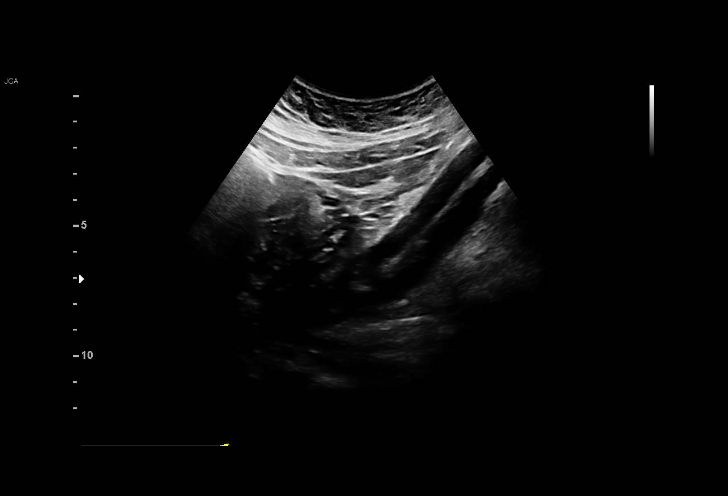
[im 22/24]
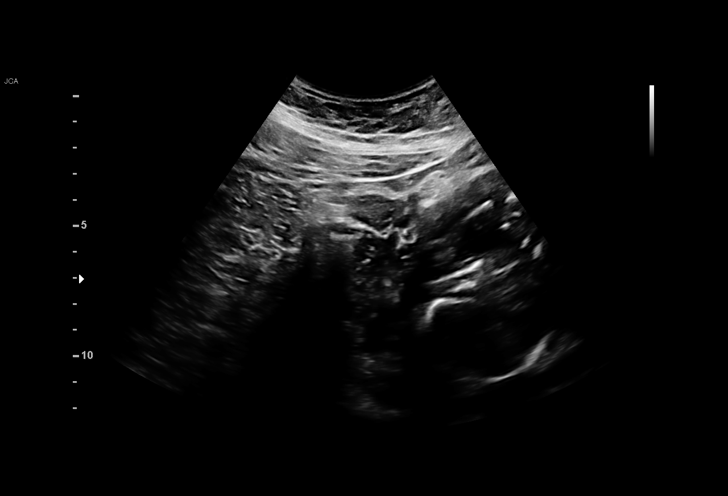
[im 24/24]
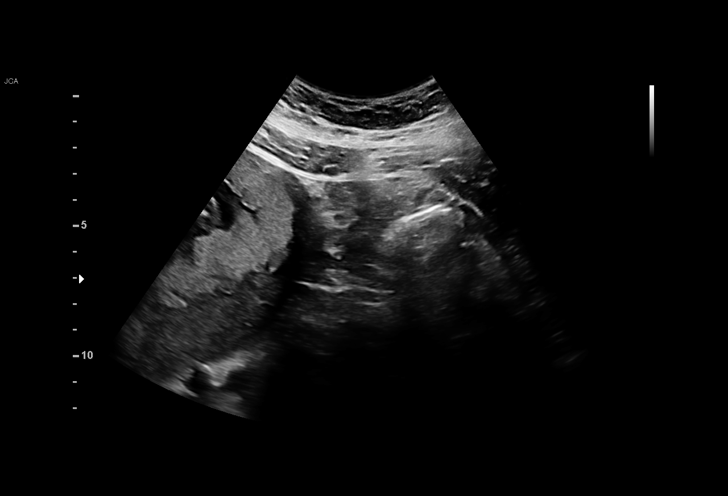

[15 of 24 positions shown; findings below may reference images not displayed]

[REDACTED]care

 1  US MFM OB LIMITED                     76815.01    [facility]

Indications

 Vaginal bleeding in pregnancy, second           [J0]
 trimester
 Advanced maternal age multigravida 35+,         [J0]
 second trimester
 18 weeks gestation of pregnancy
Fetal Evaluation

 Num Of Fetuses:          1
 Fetal Heart Rate(bpm):   145
 Cardiac Activity:        Observed
 Presentation:            Cephalic
 Placenta:                Posterior
 P. Cord Insertion:       Visualized

 Amniotic Fluid
 AFI FV:      Within normal limits

                             Largest Pocket(cm)

Gestational Age

 Best:          18w 0d     Det. By:  U/S C R L  ([DATE])    EDD:   [DATE]
Anatomy

 Diaphragm:             Not well visualized    Kidneys:                Appear normal
 Stomach:               Appears normal, left   Bladder:                Appears normal
                        sided
Cervix Uterus Adnexa
 Cervix
 Not adaquately visualized
Impression

 Patient was evaluated at the [facility] for c/o vaginal bleeding.
 Pelvic examination revealed no pathology.
 A limited ultrasound study was performed .Amniotic fluid is
 normal and good fetal activity is seen .Placenta is posterior
 and there is no previa or low-lying placenta. No retroplacental
 hemorrhage.
                 [facility]
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
## 2020-03-05 NOTE — MAU Note (Signed)
Pt reports to mau via ems with c/o vag bleeding.  Pt states she noticed some brown dc yesterday, called after hours provider who told her to monitor it.  Pt states she woke up around 0600 this morning and had a big gush of blood.  Pt states she has continued to bleed.  No pain at this time, not vag itching or odor noted.  FHR 145

## 2020-03-05 NOTE — Discharge Instructions (Signed)
Vaginal Bleeding During Pregnancy, Second Trimester  A small amount of bleeding (spotting) from the vagina is common during pregnancy. Sometimes the bleeding is normal and is not a sign of problems. In some other cases, it is a sign of something serious. Tell your doctor right away if there is any bleeding from your vagina. Follow these instructions at home: Activity  Follow your doctor's instructions about how active you can be.  If needed, make plans for someone to help with your normal activities.  Do not exercise or do activities that take a lot of effort until your doctor says that this is safe.  Do not lift anything that is heavier than 10 lb (4.5 kg) until your doctor says that this is safe.  Do not have sex or orgasms until your doctor says that this is safe. Medicines  Take over-the-counter and prescription medicines only as told by your doctor.  Do not take aspirin. It can cause bleeding. General instructions  Watch your condition for any changes.  Write down: ? The number of pads you use each day. ? How often you change pads. ? How soaked your pads are.  Do not use tampons.  Do not douche.  If you pass any tissue from your vagina, save it to show to your doctor.  Keep all follow-up visits as told by your doctor. This is important. Contact a doctor if:  You have bleeding in the vagina at any time during pregnancy.  You have cramps.  You have a fever that does not get better with medicine. Get help right away if:  You have very bad cramps in your back or belly (abdomen).  You have contractions.  You have chills.  You pass large clots or a lot of tissue from your vagina.  Your bleeding gets worse.  You feel light-headed.  You feel weak.  You pass out (faint).  You are leaking fluid from your vagina.  You have a gush of fluid from your vagina. Summary  Sometimes vaginal bleeding during pregnancy is normal and is not a problem. Sometimes it may  be a sign of something serious.  Tell your doctor about any bleeding from your vagina right away.  Follow your doctor's instructions about how active you can be. You may need someone to help you with your normal activities. This information is not intended to replace advice given to you by your health care provider. Make sure you discuss any questions you have with your health care provider. Document Revised: 09/01/2018 Document Reviewed: 08/14/2016 Elsevier Patient Education  2020 [employer]  

## 2020-03-06 LAB — GC/CHLAMYDIA PROBE AMP (~~LOC~~) NOT AT ARMC
Chlamydia: NEGATIVE
Comment: NEGATIVE
Comment: NORMAL
Neisseria Gonorrhea: NEGATIVE

## 2020-03-08 ENCOUNTER — Ambulatory Visit: Payer: Medicaid Other

## 2020-03-09 DIAGNOSIS — E282 Polycystic ovarian syndrome: Secondary | ICD-10-CM | POA: Diagnosis not present

## 2020-03-09 DIAGNOSIS — O99212 Obesity complicating pregnancy, second trimester: Secondary | ICD-10-CM | POA: Diagnosis not present

## 2020-03-09 DIAGNOSIS — Z23 Encounter for immunization: Secondary | ICD-10-CM | POA: Diagnosis not present

## 2020-03-09 DIAGNOSIS — O09522 Supervision of elderly multigravida, second trimester: Secondary | ICD-10-CM | POA: Diagnosis not present

## 2020-03-09 DIAGNOSIS — M549 Dorsalgia, unspecified: Secondary | ICD-10-CM | POA: Diagnosis not present

## 2020-03-09 DIAGNOSIS — N08 Glomerular disorders in diseases classified elsewhere: Secondary | ICD-10-CM | POA: Diagnosis not present

## 2020-03-09 DIAGNOSIS — O10012 Pre-existing essential hypertension complicating pregnancy, second trimester: Secondary | ICD-10-CM | POA: Diagnosis not present

## 2020-03-09 DIAGNOSIS — Z3A18 18 weeks gestation of pregnancy: Secondary | ICD-10-CM | POA: Diagnosis not present

## 2020-03-15 ENCOUNTER — Other Ambulatory Visit: Payer: Self-pay

## 2020-03-15 ENCOUNTER — Ambulatory Visit: Payer: Medicaid Other | Attending: Obstetrics and Gynecology

## 2020-03-15 DIAGNOSIS — G8929 Other chronic pain: Secondary | ICD-10-CM | POA: Insufficient documentation

## 2020-03-15 DIAGNOSIS — M545 Low back pain, unspecified: Secondary | ICD-10-CM | POA: Diagnosis not present

## 2020-03-15 NOTE — Therapy (Signed)
[facility] [address], [address] [place], [place], [address] Phone: [phone]   Fax:  [phone]  Physical Therapy Treatment  Patient Details  Name: Donna Burns MRN: [id number] Date of Birth: 05-Jul-1980 Referring Provider (PT): Sherian Rein, MD   Encounter Date: 03/15/2020   PT End of Session - 03/15/20 1627    Visit Number 1    Date for PT Re-Evaluation 05/10/20    Authorization Type Medicaid: [employer] auth needed 30 visits combined PT/OT/ST    Authorization - Visit Number 1    Authorization - Number of Visits 30    PT Start Time 1539    PT Stop Time 1620    PT Time Calculation (min) 41 min    Activity Tolerance Patient tolerated treatment well           Past Medical History:  Diagnosis Date  . Bilateral polycystic ovarian syndrome   . Chronic kidney disease   . HSV-2 (herpes simplex virus 2) infection   . Hypertension 2008  . Prediabetes   . Vaginal Pap smear, abnormal     Past Surgical History:  Procedure Laterality Date  . KNEE SURGERY Right 2015   torn cartilage--arthroscopic  . MOUTH SURGERY  2016   9 teeth pulled for decay-upper incisors.    There were no vitals filed for this visit.   Subjective Assessment - 03/15/20 1543    Subjective Pt reports to PT with LBP if a chronic nature. Pt is [redacted] weeks pregnant and wants to learn exercises to improve her back pain and allow her to get back into exercise that is safe.   Pt has had radiculoapthy in Rt and Lt leg in the past but not now.    Pertinent History L4/5 disc bulge and stenosis, Covid with long covid symptoms.  [redacted] weeks pregnant: due 08/03/20    Limitations Standing;Walking    How long can you stand comfortably? 20 minutes for washing dishing    How long can you walk comfortably? unsure, 1/4 mile    Diagnostic tests MRI: L4/5 disc bulge and spinal stenosis    Patient Stated Goals learn exercises to reduce LBP as pregnancy progresses     Currently in Pain? Yes    Pain Score 0-No pain   5-7/10   Pain Location Back    Pain Orientation Lower;Left;Right    Pain Descriptors / Indicators Aching    Pain Type Chronic pain    Pain Onset More than a month ago    Pain Frequency Intermittent    Aggravating Factors  movement, activity, vacuuming    Pain Relieving Factors sitting              [facility] PT Assessment - 03/15/20 0001      Assessment   Medical Diagnosis dorsalgia, chronic back pain     Referring Provider (PT) Sherian Rein, MD    Onset Date/Surgical Date 03/15/18    Next MD Visit every month for OB appt    Prior Therapy none      Precautions   Precautions Other (comment)    Precaution Comments Pt is pregnant- 20 weeks       Restrictions   Weight Bearing Restrictions No      Balance Screen   Has the patient fallen in the past 6 months No    Has the patient had a decrease in activity level because of a fear of falling?  No    Is the patient reluctant to leave  their home because of a fear of falling?  No      Home Environment   Living Environment Private residence    Living Arrangements Spouse/significant other    Type of Home Apartment    Home Access Stairs to enter    Home Layout One level      Prior Function   Level of Independence Independent    Vocation Unemployed    Medical illustrator- unemployed.      Leisure walking for exercise- has not been doing this       Cognition   Overall Cognitive Status Within Functional Limits for tasks assessed      Observation/Other Assessments   Focus on Therapeutic Outcomes (FOTO)  50% limitation      Posture/Postural Control   Posture/Postural Control Postural limitations    Postural Limitations Forward head;Increased lumbar lordosis      ROM / Strength   AROM / PROM / Strength AROM;Strength      AROM   Overall AROM  Within functional limits for tasks performed    Overall AROM Comments proximal hamstring pain with lumbar flexion        Strength   Overall Strength Within functional limits for tasks performed    Overall Strength Comments 5/5 bil LE strength      Palpation   Palpation comment tension at proximal hamstring insertions and bil lumbar paraspinals      Transfers   Transfers Independent with all Transfers      Ambulation/Gait   Ambulation/Gait Yes    Gait Pattern Within Functional Limits                                 PT Education - 03/15/20 1619    Education Details Access Code: [email or web address]    Person(s) Educated Patient    Methods Explanation;Demonstration;Handout    Comprehension Verbalized understanding;Returned demonstration            PT Short Term Goals - 03/15/20 1559      PT SHORT TERM GOAL #1   Title be independent in initial HEP    Time 4    Period Weeks    Status New    Target Date 04/12/20      PT SHORT TERM GOAL #2   Title initiate a walking program for exercise and verbalize safe progression    Time 4    Period Weeks    Status New    Target Date 04/12/20             PT Long Term Goals - 03/15/20 1641      PT LONG TERM GOAL #1   Title be independent in advanced HEP    Time 6    Period Weeks    Status New    Target Date 04/26/20      PT LONG TERM GOAL #2   Title reduce FOTO to < or = to 39% limitation    Time 6    Period Weeks    Status New    Target Date 04/26/20      PT LONG TERM GOAL #3   Title report a 70% reduction in LBP/LE pain with standing and walking    Time 6    Period Weeks    Status New    Target Date 04/26/20      PT LONG TERM GOAL #4   Title verbalize and demonstrate body mechanics modifications  for self-care and ADLs to protect lumbar spine    Time 6    Period Weeks    Status New    Target Date 04/26/20      PT LONG TERM GOAL #5   Title particiate in regular physical activity including walking and light weight training to improve endurance for community function and care of baby    Time 6    Period Weeks     Status New    Target Date 04/26/20                 Plan - 03/15/20 1639    Clinical Impression Statement Pt presents to PT with LBP of a chronic nature.  Pt is [redacted] weeks pregnant and wants to learn exercises to reduce LBP and improve her overall mobility that is safe during pregnancy.  Pt reports that she had LE pain over a year ago and had epidural injection.  MRI complete 2 years ago showed L4/5 disc bulge and spinal stenosis.  Pt reports 5-7/10 central lumbar and proximal hamstring pain with standing, walking and vacuuming.  Pt is limited to standing when washing dishes due to pain.  Pt reports a decline in physical activity over the past months and wants to get back to regular activity.  Pt demonstrated full lumbar A/ROM with proximal hamstring pain/tension reported with this.  Pt with tension in bil lumbar paraspinals and  hamstrings with palpation.  Pt will benefit from skilled PT to address lumbar pain, improve lumbopelvic functional strength and improve tolerance for standing tasks.    Personal Factors and Comorbidities Comorbidity 1    Comorbidities L4/5 bulge and stenosis    Examination-Activity Limitations Lift;Stand;Squat;Locomotion Level    Examination-Participation Restrictions Community Activity;Laundry;Shop    Stability/Clinical Decision Making Stable/Uncomplicated    Clinical Decision Making Low    Rehab Potential Good    PT Frequency 2x / week    PT Duration 6 weeks    PT Treatment/Interventions ADLs/Self Care Home Management;Cryotherapy;Moist Heat;Therapeutic exercise;Therapeutic activities;Patient/family education;Manual techniques;Taping    PT Next Visit Plan assess walking program progress, review HEP, work on core/hip strength (quadruped?), squats, body mechanics education    Consulted and Agree with Plan of Care Patient           Patient will benefit from skilled therapeutic intervention in order to improve the following deficits and impairments:  Decreased  activity tolerance, Decreased strength, Postural dysfunction, Improper body mechanics, Pain, Decreased endurance  Visit Diagnosis: Chronic bilateral low back pain without sciatica - Plan: PT plan of care cert/re-cert     Problem List Patient Active Problem List   Diagnosis Date Noted  . Encounter for supervision of high risk pregnancy due to advanced maternal age in primigravida 11/26/2019  . Chronic bilateral low back pain with left-sided sciatica 09/18/2019  . Eczema 09/18/2019  . Stage 1 chronic kidney disease 02/26/2019  . Tinea pedis of both feet 02/26/2019  . Tinea corporis 02/26/2019  . Prediabetes   . VAGINAL DISCHARGE 04/13/2010  . ANEMIA 10/27/2008  . VAGINITIS, BACTERIAL 10/27/2008  . Essential hypertension 06/21/2008  . LEG CRAMPS 06/21/2008  . ENDOMETRIAL HYPERPLASIA UNSPECIFIED 01/18/2008  . Morbid obesity (HCC) 10/02/2007  . ECZEMA 10/02/2007  . 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 DENTAL CARIES 08/19/2007  . PCOS (polycystic ovarian syndrome) 05/27/1997     Lorrene Reid, PT 03/15/20 4:47 PM  [facility] [address] [place], [place], [address] Phone: [phone]   Fax:  [phone]  Name: Gala LewandowskyDesiray Grissett MRN: [id number] Date of Birth: 09/30/1980

## 2020-03-15 NOTE — Patient Instructions (Addendum)
WALKING  Walking is a great form of exercise to increase your strength, endurance and overall fitness.  A walking program can help you start slowly and gradually build endurance as you go.  Everyone's ability is different, so each person's starting point will be different.  You do not have to follow them exactly.  The are just samples. You should simply find out what's right for you and stick to that program.   In the beginning, you'll start off walking 2-3 times a day for short distances.  As you get stronger, you'll be walking further at just 1-2 times per day.  A. You Can Walk For A Certain Length Of Time Each Day    Walk 5 minutes 3 times per day.  Increase 2 minutes every 2 days (3 times per day).  Work up to 25-30 minutes (1-2 times per day).   Example:   Day 1-2 5 minutes 3 times per day   Day 7-8 12 minutes 2-3 times per day   Day 13-14 25 minutes 1-2 times per day  B. You Can Walk For a Certain Distance Each Day     Distance can be substituted for time.    Example:   3 trips to mailbox (at road)   3 trips to corner of block   3 trips around the block Access Code: [email or web address] URL: [email or web address] Date: 03/15/2020 Prepared by: Tresa Endo  Exercises Clamshell - 2 x daily - 7 x weekly - 2 sets - 10 reps Seated Hamstring Stretch - 3 x daily - 7 x weekly - 1 sets - 3 reps - 20 hold Seated Figure 4 Piriformis Stretch - 1 x daily - 7 x weekly - 3 sets - 10 reps Seated Transversus Abdominis Bracing - 1 x daily - 7 x weekly - 3 sets - 10 reps

## 2020-03-17 ENCOUNTER — Other Ambulatory Visit: Payer: Self-pay

## 2020-03-17 ENCOUNTER — Ambulatory Visit: Payer: Medicaid Other | Attending: Obstetrics and Gynecology

## 2020-03-17 ENCOUNTER — Encounter: Payer: Self-pay | Admitting: *Deleted

## 2020-03-17 ENCOUNTER — Other Ambulatory Visit: Payer: Self-pay | Admitting: *Deleted

## 2020-03-17 ENCOUNTER — Ambulatory Visit: Payer: Medicaid Other | Admitting: *Deleted

## 2020-03-17 VITALS — BP 133/86 | HR 85

## 2020-03-17 DIAGNOSIS — O10912 Unspecified pre-existing hypertension complicating pregnancy, second trimester: Secondary | ICD-10-CM | POA: Diagnosis not present

## 2020-03-17 DIAGNOSIS — O99891 Other specified diseases and conditions complicating pregnancy: Secondary | ICD-10-CM | POA: Diagnosis not present

## 2020-03-17 DIAGNOSIS — Z3A19 19 weeks gestation of pregnancy: Secondary | ICD-10-CM

## 2020-03-17 DIAGNOSIS — O09522 Supervision of elderly multigravida, second trimester: Secondary | ICD-10-CM | POA: Diagnosis not present

## 2020-03-17 DIAGNOSIS — O99212 Obesity complicating pregnancy, second trimester: Secondary | ICD-10-CM | POA: Diagnosis not present

## 2020-03-17 DIAGNOSIS — B009 Herpesviral infection, unspecified: Secondary | ICD-10-CM

## 2020-03-17 DIAGNOSIS — O98512 Other viral diseases complicating pregnancy, second trimester: Secondary | ICD-10-CM

## 2020-03-17 DIAGNOSIS — O10919 Unspecified pre-existing hypertension complicating pregnancy, unspecified trimester: Secondary | ICD-10-CM

## 2020-03-17 DIAGNOSIS — Z363 Encounter for antenatal screening for malformations: Secondary | ICD-10-CM

## 2020-03-17 DIAGNOSIS — O4692 Antepartum hemorrhage, unspecified, second trimester: Secondary | ICD-10-CM | POA: Diagnosis not present

## 2020-03-17 DIAGNOSIS — N189 Chronic kidney disease, unspecified: Secondary | ICD-10-CM | POA: Diagnosis not present

## 2020-03-17 IMAGING — US US MFM OB DETAIL+14 WK
1 series · 12 of 28 positions shown · non-contrast
Comparison: none

[Series 1: us mfm ob detail+14 wk · 12 of 72 slices shown]
[im 3/72]
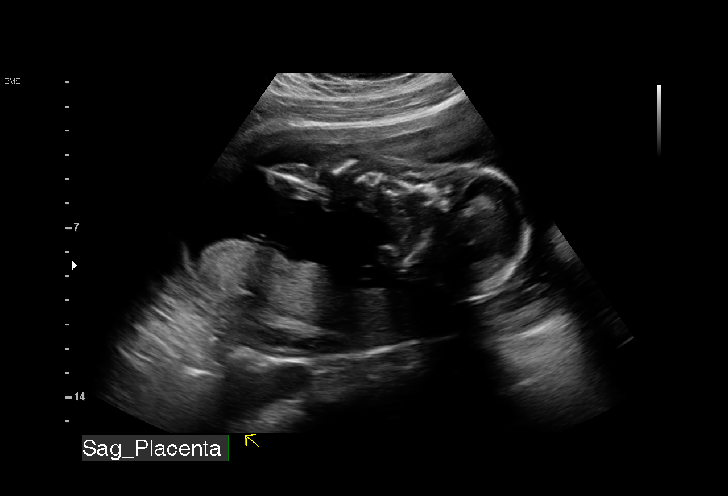
[im 8/72]
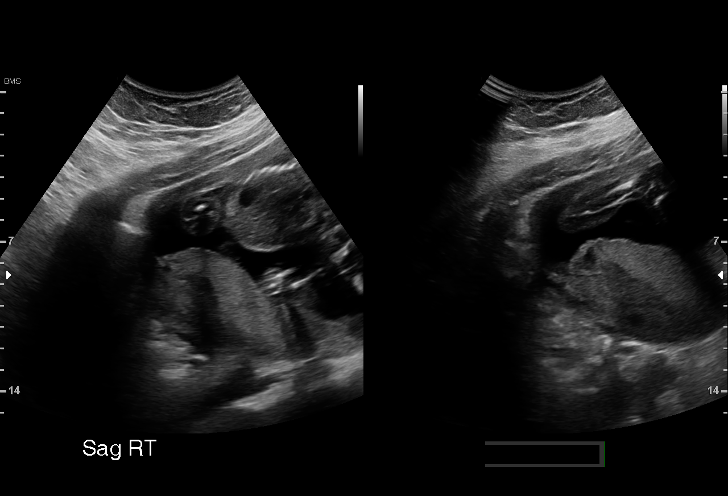
[im 14/72]
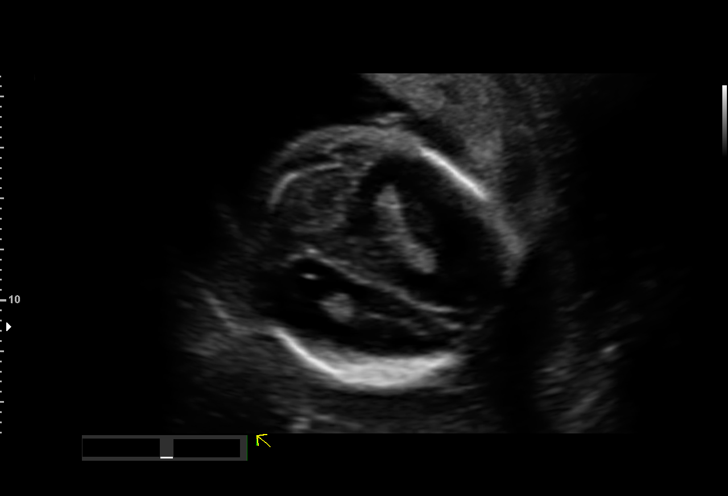
[im 22/72]
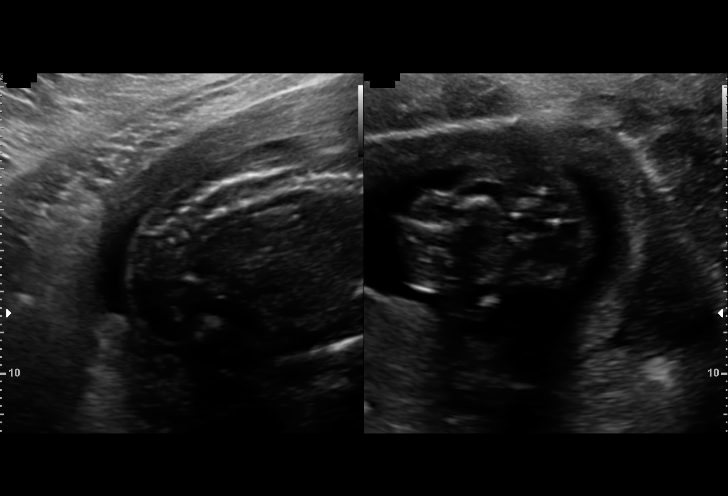
[im 27/72]
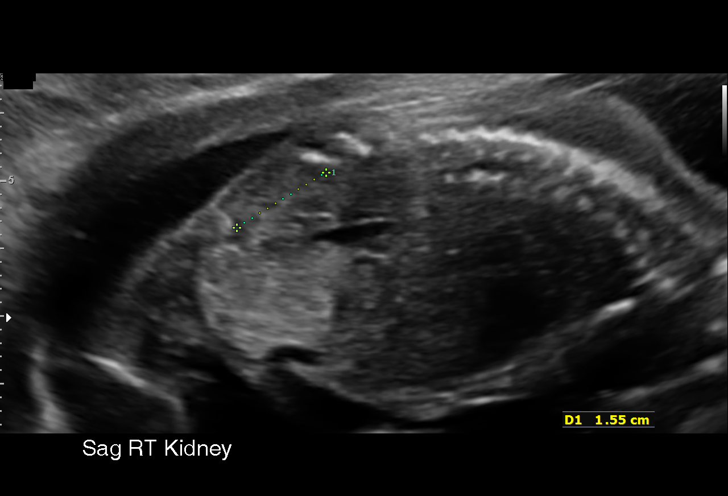
[im 32/72]
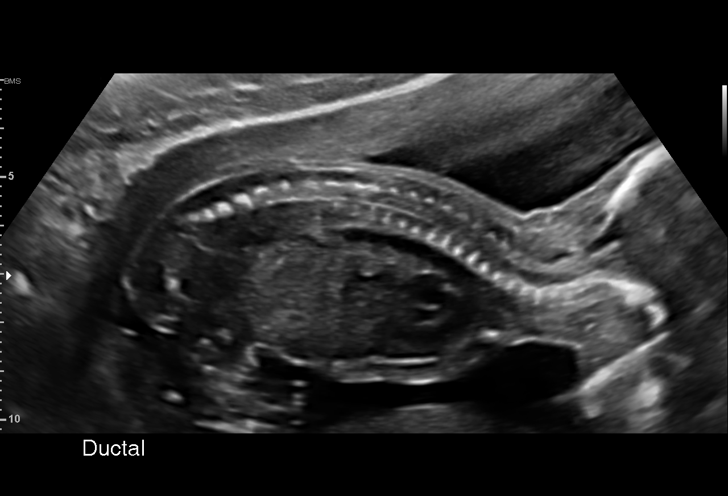
[im 40/72]
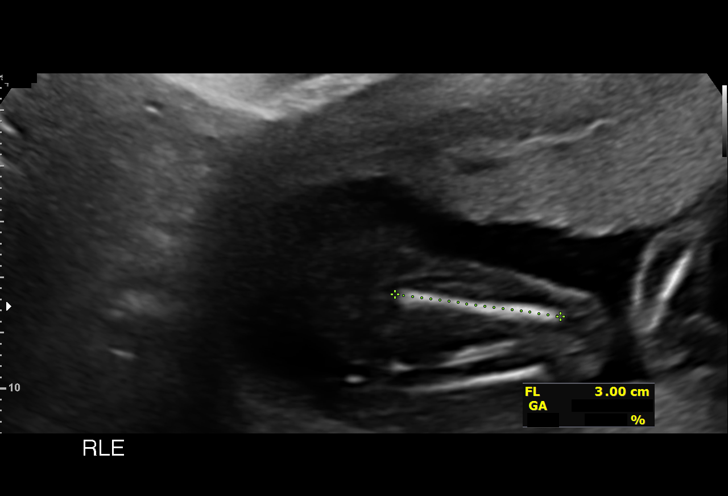
[im 45/72]
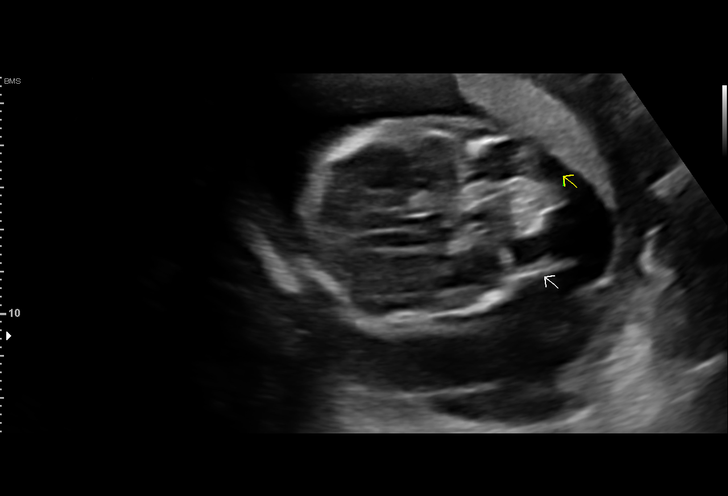
[im 50/72]
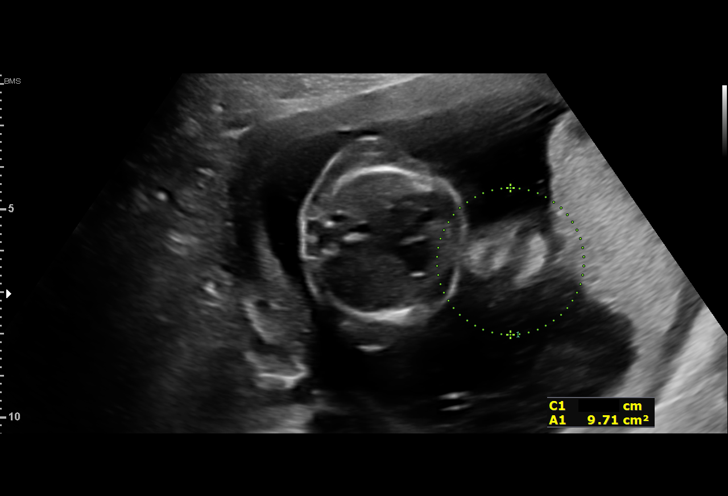
[im 58/72]
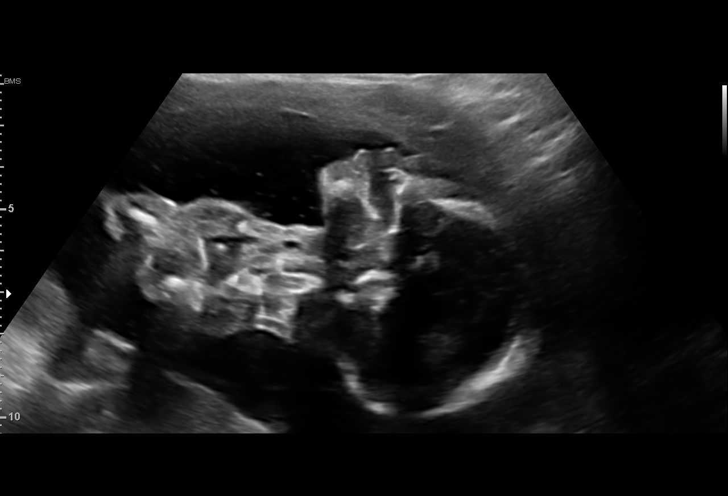
[im 64/72]
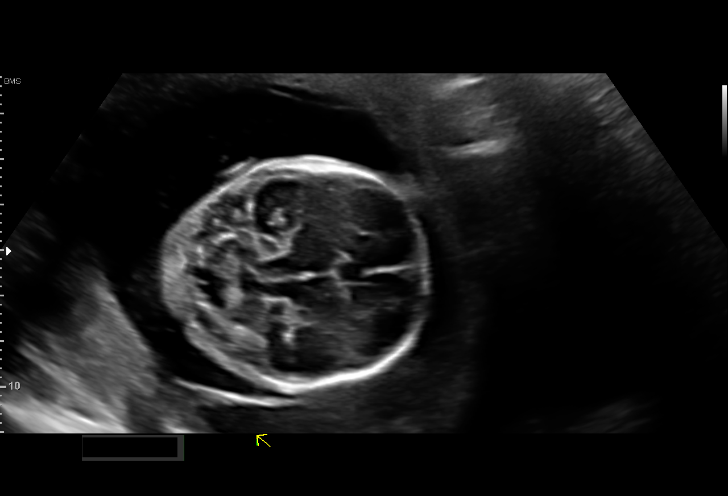
[im 69/72]
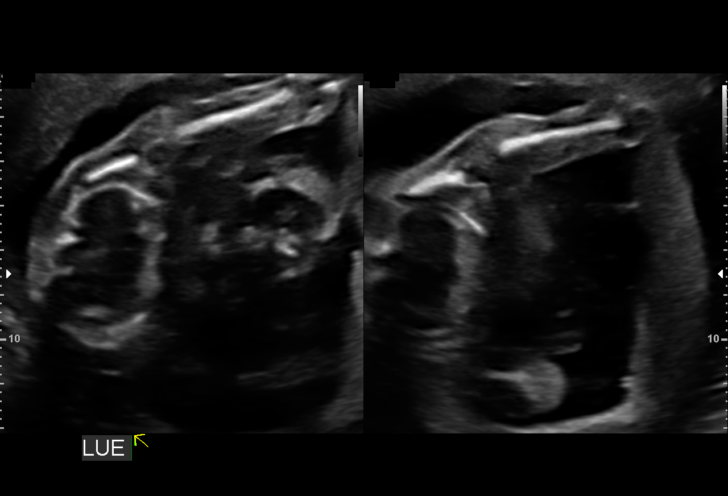

[12 of 28 positions shown; findings below may reference images not displayed]

[REDACTED]care

Indications

 Hypertension - Chronic/Pre-existing            [6I]
 (labetalol)
 Advanced maternal age multigravida 35+,        [6I]
 second trimester
 Encounter for antenatal screening for          [6I]
 malformations
 Vaginal bleeding in pregnancy, second          [6I]
 trimester
 Herpes simplex virus (KAJUS)                     [6I] [6I]
 Obesity complicating pregnancy, second         [6I]
 trimester
 19 weeks gestation of pregnancy
Fetal Evaluation

 Num Of Fetuses:         1
 Fetal Heart Rate(bpm):  147
 Cardiac Activity:       Observed
 Presentation:           Cephalic
 Placenta:               Posterior
 P. Cord Insertion:      Visualized, central

 Amniotic Fluid
 AFI FV:      Within normal limits

                             Largest Pocket(cm)

Biometry
 BPD:      49.2  mm     G. Age:  20w 6d         90  %    CI:        82.13   %    70 - 86
                                                         FL/HC:      17.3   %    16.8 -
 HC:      171.3  mm     G. Age:  19w 5d         42  %    HC/AC:      1.14        1.09 -
 AC:      150.3  mm     G. Age:  20w 2d         64  %    FL/BPD:     60.4   %
 FL:       29.7  mm     G. Age:  19w 1d         24  %    FL/AC:      19.8   %    20 - 24
 HUM:      25.3  mm     G. Age:  18w 0d        < 5  %
 CER:      20.8  mm     G. Age:  19w 6d         70  %
 NFT:       4.8  mm

 LV:        5.7  mm
 CM:        4.2  mm

 Est. FW:     313  gm    0 lb 11 oz      50  %
Gestational Age

 U/S Today:     20w 0d                                        EDD:   [DATE]
 Best:          19w 5d     Det. By:  U/S C R L  ([DATE])    EDD:   [DATE]
Anatomy

 Cranium:               Appears normal         LVOT:                   Appears normal
 Cavum:                 Appears normal         Aortic Arch:            Not well visualized
 Ventricles:            Appears normal         Ductal Arch:            Appears normal
 Choroid Plexus:        Appears normal         Diaphragm:              Appears normal
 Cerebellum:            Appears normal         Stomach:                Appears normal, left
                                                                       sided
 Posterior Fossa:       Appears normal         Abdomen:                Appears normal
 Nuchal Fold:           Appears normal         Abdominal Wall:         Appears nml (cord
                                                                       insert, abd wall)
 Face:                  Appears normal         Cord Vessels:           Appears normal (3
                        (orbits and profile)                           vessel cord)
 Lips:                  Appears normal         Kidneys:                Appear normal
 Palate:                Not well visualized    Bladder:                Appears normal
 Thoracic:              Appears normal         Spine:                  Appears normal
 Heart:                 Not well visualized    Upper Extremities:      Appears normal
 RVOT:                  Not well visualized    Lower Extremities:      Appears normal

 Other:  Hands visualized. Feet visualized. Fetus appears to be a male.
Cervix Uterus Adnexa

 Cervix
 Length:           4.03  cm.
 Normal appearance by transabdominal scan.

 Uterus
 No abnormality visualized.

 Right Ovary
 Not visualized.

 Left Ovary
 Not visualized.

 Cul De Sac
 No free fluid seen.
 Adnexa
 No adnexal mass visualized.
Comments

 This patient was seen for a detailed fetal anatomy scan due
 to advanced maternal age (39 years old), obesity, chronic
 hypertension, and chronic kidney disease.  She is currently
 treated with labetalol 200 mg twice a day and nifedipine 60
 mg XL daily for treatment of chronic hypertension.  She
 reports that her serum creatinine level earlier in her
 pregnancy was about 1.15.  She has seen a nephrologist
 who has recommended a renal biopsy after delivery.  She
 was told by the nephrologist that her kidney disease may be
 related to lupus.  She also reports that she failed her 1 hour
 glucose screening test.  However, she passed her 3-hour
 glucose tolerance test.
 She had a cell free DNA test earlier in her pregnancy which
 indicated a low risk for trisomy 21, 18, and 13. A male fetus is
 predicted.
 She was informed that the fetal growth and amniotic fluid
 level were appropriate for her gestational age.
 The views of the fetal anatomy were unable to be fully
 visualized today due to the fetal position and maternal body
 habitus.
 The patient was informed that anomalies may be missed due
 to technical limitations. If the fetus is in a suboptimal position
 or maternal habitus is increased, visualization of the fetus in
 the maternal uterus may be impaired.
 The implications and management of chronic hypertension in
 pregnancy was discussed. The patient was advised that
 should her blood pressures continue to be elevated, the
 dosage of her antihypertensive medications may need to be
 increased.  The increased risk of superimposed
 preeclampsia, an indicated preterm delivery, and possible
 fetal growth restriction due to chronic hypertension in
 pregnancy was discussed. The patient was advised that we
 will continue to follow her closely throughout her pregnancy.
 We will continue to follow her with monthly growth scans.
 Weekly fetal testing should be started at around 32 weeks.
 To decrease her risk of superimposed preeclampsia, she
 should continue taking a daily baby aspirin (81 mg daily) for
 preeclampsia prophylaxis.
 The increased risk of fetal aneuploidy due to advanced
 maternal age was discussed. Due to advanced maternal age,
 the patient was offered and declined an amniocentesis today
 for definitive diagnosis of fetal aneuploidy.
 A follow-up exam was scheduled in 4 weeks to complete the
 views of the fetal anatomy.

## 2020-03-22 ENCOUNTER — Ambulatory Visit: Payer: Medicaid Other

## 2020-03-22 ENCOUNTER — Other Ambulatory Visit: Payer: Self-pay

## 2020-03-22 DIAGNOSIS — G8929 Other chronic pain: Secondary | ICD-10-CM

## 2020-03-22 DIAGNOSIS — M545 Low back pain, unspecified: Secondary | ICD-10-CM | POA: Diagnosis not present

## 2020-03-22 NOTE — Patient Instructions (Addendum)
   Lifting Principles  .Maintain proper posture and head alignment. .Slide object as close as possible before lifting. .Move obstacles out of the way. .Test before lifting; ask for help if too heavy. .Tighten stomach muscles without holding breath. .Use smooth movements; do not jerk. .Use legs to do the work, and pivot with feet. .Distribute the work load symmetrically and close to the center of trunk. .Push instead of pull whenever possible.   Squat down and hold basket close to stand. Use leg muscles to do the work.    Avoid twisting or bending back. Pivot around using foot movements, and bend at knees if needed when reaching for articles.        Getting Into / Out of Bed   Lower self to lie down on one side by raising legs and lowering head at the same time. Use arms to assist moving without twisting. Bend both knees to roll onto back if desired. To sit up, start from lying on side, and use same move-ments in reverse. Keep trunk aligned with legs.    Shift weight from front foot to back foot as item is lifted off shelf.    When leaning forward to pick object up from floor, extend one leg out behind. Keep back straight. Hold onto a sturdy support with other hand.      Sit upright, head facing forward. Try using a roll to support lower back. Keep shoulders relaxed, and avoid rounded back. Keep hips level with knees. Avoid crossing legs for long periods.  Access Code: [email or web address] URL: [email or web address] Date: 03/22/2020 Prepared by: Tresa Endo  Exercises  Standing Hip Abduction with Counter Support - 2 x daily - 7 x weekly - 2 sets - 10 reps Standing Hip Extension with Counter Support - 2 x daily - 7 x weekly - 2 sets - 10 reps   [facility] [address], [address] [place], [place] [address] Phone # [phone] Fax [phone]

## 2020-03-22 NOTE — Therapy (Signed)
[facility] [address], [address] [place], [place], [address] Phone: [phone]   Fax:  [phone]  Physical Therapy Treatment  Patient Details  Name: Donna Burns MRN: [id number] Date of Birth: 04/04/1981 Referring Provider (PT): Sherian Rein, MD   Encounter Date: 03/22/2020   PT End of Session - 03/22/20 1524    Visit Number 2    Date for PT Re-Evaluation 05/10/20    Authorization Type Medicaid: [employer] auth needed 30 visits combined PT/OT/ST    Authorization - Visit Number 2    Authorization - Number of Visits 30    PT Start Time 1455    PT Stop Time 1522    PT Time Calculation (min) 27 min    Activity Tolerance Patient tolerated treatment well    Behavior During Therapy [facility] for tasks assessed/performed           Past Medical History:  Diagnosis Date  . Bilateral polycystic ovarian syndrome   . Chronic kidney disease   . HSV-2 (herpes simplex virus 2) infection   . Hypertension 2008  . Prediabetes   . Vaginal Pap smear, abnormal     Past Surgical History:  Procedure Laterality Date  . KNEE SURGERY Right 2015   torn cartilage--arthroscopic  . MOUTH SURGERY  2016   9 teeth pulled for decay-upper incisors.    There were no vitals filed for this visit.   Subjective Assessment - 03/22/20 1453    Subjective I'm doing some walking and I didn't have any pain.    Pertinent History L4/5 disc bulge and stenosis, Covid with long covid symptoms.  [redacted] weeks pregnant: due 08/03/20    Currently in Pain? No/denies    Pain Score 0-No pain                             [facility] Adult PT Treatment/Exercise - 03/22/20 0001      Exercises   Exercises Lumbar;Knee/Hip      Lumbar Exercises: Stretches   Active Hamstring Stretch 3 reps;20 seconds    Piriformis Stretch 3 reps;20 seconds      Lumbar Exercises: Sidelying   Clam 20 reps      Knee/Hip Exercises: Standing   Hip Abduction  Stengthening;Both;2 sets;10 reps    Hip Extension Stengthening;Both;2 sets;10 reps                  PT Education - 03/22/20 1509    Education Details Access Code: [email or web address], body mechanics education    Person(s) Educated Patient    Methods Explanation;Demonstration;Handout    Comprehension Verbalized understanding;Returned demonstration            PT Short Term Goals - 03/15/20 1559      PT SHORT TERM GOAL #1   Title be independent in initial HEP    Time 4    Period Weeks    Status New    Target Date 04/12/20      PT SHORT TERM GOAL #2   Title initiate a walking program for exercise and verbalize safe progression    Time 4    Period Weeks    Status New    Target Date 04/12/20             PT Long Term Goals - 03/15/20 1641      PT LONG TERM GOAL #1   Title be independent in advanced HEP    Time 6  Period Weeks    Status New    Target Date 04/26/20      PT LONG TERM GOAL #2   Title reduce FOTO to < or = to 39% limitation    Time 6    Period Weeks    Status New    Target Date 04/26/20      PT LONG TERM GOAL #3   Title report a 70% reduction in LBP/LE pain with standing and walking    Time 6    Period Weeks    Status New    Target Date 04/26/20      PT LONG TERM GOAL #4   Title verbalize and demonstrate body mechanics modifications for self-care and ADLs to protect lumbar spine    Time 6    Period Weeks    Status New    Target Date 04/26/20      PT LONG TERM GOAL #5   Title particiate in regular physical activity including walking and light weight training to improve endurance for community function and care of baby    Time 6    Period Weeks    Status New    Target Date 04/26/20                 Plan - 03/22/20 1523    Clinical Impression Statement Pt with first time follow-up after evaluation.  Pt is independent in HEP for flexibility and initial core strength.  Pt has walked some but not able to do as much due to needing a  vehicle to get to the park. Pt has elliptical at home and will do this at home instead.   Pt demonstrated all aspects of HEP correctly today with minor cueing required for clam shells.  Pt has been using TA activation when cleaning the house to protect the lumbar spine.  PT provided body mechanics education today to show lumbar protection techniques.  Pt verbalized understanding and visuals provided.  Pt was fatigued with standing hip strength exercises and PT advised to reduce repetitions if fatigued.  Pt will continue to benefit from skilled PT to address lumbar pain, hip strength and core strength.    PT Frequency 2x / week    PT Duration 6 weeks    PT Treatment/Interventions ADLs/Self Care Home Management;Cryotherapy;Moist Heat;Therapeutic exercise;Therapeutic activities;Patient/family education;Manual techniques;Taping    PT Next Visit Plan see how pt is doing on ellpitical, sit to stand, review HEP and follow-up on body mechanics modifications    PT Home Exercise Plan Access Code: [email or web address]    Consulted and Agree with Plan of Care Patient           Patient will benefit from skilled therapeutic intervention in order to improve the following deficits and impairments:  Decreased activity tolerance, Decreased strength, Postural dysfunction, Improper body mechanics, Pain, Decreased endurance  Visit Diagnosis: Chronic bilateral low back pain without sciatica     Problem List Patient Active Problem List   Diagnosis Date Noted  . Encounter for supervision of high risk pregnancy due to advanced maternal age in primigravida 11/26/2019  . Chronic bilateral low back pain with left-sided sciatica 09/18/2019  . Eczema 09/18/2019  . Stage 1 chronic kidney disease 02/26/2019  . Tinea pedis of both feet 02/26/2019  . Tinea corporis 02/26/2019  . Prediabetes   . VAGINAL DISCHARGE 04/13/2010  . ANEMIA 10/27/2008  . VAGINITIS, BACTERIAL 10/27/2008  . Essential hypertension 06/21/2008  . LEG CRAMPS  06/21/2008  . ENDOMETRIAL HYPERPLASIA UNSPECIFIED 01/18/2008  .  Morbid obesity (HCC) 10/02/2007  . ECZEMA 10/02/2007  . 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 DENTAL CARIES 08/19/2007  . PCOS (polycystic ovarian syndrome) 05/27/1997    Lorrene Reid, PT 03/22/20 3:29 PM  [facility] [address], [address] [place], [place], [address] Phone: [phone]   Fax:  [phone]  Name: Onesha Krebbs MRN: [id number] Date of Birth: February 11, 1981

## 2020-03-30 ENCOUNTER — Ambulatory Visit: Payer: Medicaid Other | Attending: Obstetrics and Gynecology

## 2020-03-30 ENCOUNTER — Other Ambulatory Visit: Payer: Self-pay

## 2020-03-30 DIAGNOSIS — M545 Low back pain, unspecified: Secondary | ICD-10-CM | POA: Insufficient documentation

## 2020-03-30 DIAGNOSIS — G8929 Other chronic pain: Secondary | ICD-10-CM | POA: Insufficient documentation

## 2020-03-30 NOTE — Patient Instructions (Signed)
Access Code: [email or web address] URL: [email or web address] Date: 03/30/2020 Prepared by: Tresa Endo  Exercises  Seated Shoulder Horizontal Abduction with Resistance - 2 x daily - 7 x weekly - 2 sets - 10 reps Sit to Stand - 2 x daily - 7 x weekly - 2 sets - 10 reps

## 2020-03-30 NOTE — Therapy (Signed)
[facility] [address], [address] [place], [place], [address] Phone: [phone]   Fax:  [phone]  Physical Therapy Treatment  Patient Details  Name: Donna Burns MRN: [id number] Date of Birth: 06/22/1980 Referring Provider (PT): Sherian Rein, MD   Encounter Date: 03/30/2020   PT End of Session - 03/30/20 1045    Visit Number 3    Date for PT Re-Evaluation 05/10/20    Authorization Type Medicaid: [employer] auth needed 30 visits combined PT/OT/ST    Authorization - Visit Number 3    Authorization - Number of Visits 30    PT Start Time 1018    PT Stop Time 1049    PT Time Calculation (min) 31 min    Activity Tolerance Patient limited by fatigue    Behavior During Therapy [facility] for tasks assessed/performed           Past Medical History:  Diagnosis Date   Bilateral polycystic ovarian syndrome    Chronic kidney disease    HSV-2 (herpes simplex virus 2) infection    Hypertension 2008   Prediabetes    Vaginal Pap smear, abnormal     Past Surgical History:  Procedure Laterality Date   KNEE SURGERY Right 2015   torn cartilage--arthroscopic   MOUTH SURGERY  2016   9 teeth pulled for decay-upper incisors.    There were no vitals filed for this visit.   Subjective Assessment - 03/30/20 1020    Subjective I have been exercising and doing my elliptical.    Pertinent History L4/5 disc bulge and stenosis, Covid with long covid symptoms.  [redacted] weeks pregnant: due 08/03/20    Currently in Pain? No/denies                             [facility] Adult PT Treatment/Exercise - 03/30/20 0001      Lumbar Exercises: Stretches   Active Hamstring Stretch 3 reps;20 seconds    Piriformis Stretch 3 reps;20 seconds      Lumbar Exercises: Aerobic   Nustep Level 2x 6 minutes    PT present to discuss progress     Lumbar Exercises: Seated   Sit to Stand 20 reps    Sit to Stand Limitations 2x10 with core  activation    Other Seated Lumbar Exercises horizontal abduction red band with TA activation 2x10 seated      Knee/Hip Exercises: Standing   Hip Abduction Stengthening;Both;2 sets;10 reps    Hip Extension Stengthening;Both;2 sets;10 reps                  PT Education - 03/30/20 1042    Education Details Access Code: [email or web address]    Person(s) Educated Patient    Methods Explanation;Demonstration;Handout    Comprehension Verbalized understanding;Returned demonstration            PT Short Term Goals - 03/15/20 1559      PT SHORT TERM GOAL #1   Title be independent in initial HEP    Time 4    Period Weeks    Status New    Target Date 04/12/20      PT SHORT TERM GOAL #2   Title initiate a walking program for exercise and verbalize safe progression    Time 4    Period Weeks    Status New    Target Date 04/12/20             PT Long  Term Goals - 03/15/20 1641      PT LONG TERM GOAL #1   Title be independent in advanced HEP    Time 6    Period Weeks    Status New    Target Date 04/26/20      PT LONG TERM GOAL #2   Title reduce FOTO to < or = to 39% limitation    Time 6    Period Weeks    Status New    Target Date 04/26/20      PT LONG TERM GOAL #3   Title report a 70% reduction in LBP/LE pain with standing and walking    Time 6    Period Weeks    Status New    Target Date 04/26/20      PT LONG TERM GOAL #4   Title verbalize and demonstrate body mechanics modifications for self-care and ADLs to protect lumbar spine    Time 6    Period Weeks    Status New    Target Date 04/26/20      PT LONG TERM GOAL #5   Title particiate in regular physical activity including walking and light weight training to improve endurance for community function and care of baby    Time 6    Period Weeks    Status New    Target Date 04/26/20                 Plan - 03/30/20 1035    Clinical Impression Statement Pt has been exercising on her elliptical at home x 5  minutes and is independent and compliant in HEP.   Pt demonstrated all aspects of HEP correctly today with minor cues required for trunk alignment with standing hip exercise.  Pt has been applying TA activation and body mechanics modifications to housework.  PT added sit to stand and horizontal abduction with abdominal bracing to HEP today.  Pt fatigued easily with exercise.   Pt will continue to benefit from skilled PT to address lumbar pain, hip strength and core strength.    Rehab Potential Good    PT Frequency 2x / week    PT Duration 6 weeks    PT Treatment/Interventions ADLs/Self Care Home Management;Cryotherapy;Moist Heat;Therapeutic exercise;Therapeutic activities;Patient/family education;Manual techniques;Taping    PT Next Visit Plan reveiw new HEP, continue to advance strength and flexiblity as tolerated    PT Home Exercise Plan Access Code: [email or web address]    Recommended Other Services initial cert is signed    Consulted and Agree with Plan of Care Patient           Patient will benefit from skilled therapeutic intervention in order to improve the following deficits and impairments:  Decreased activity tolerance, Decreased strength, Postural dysfunction, Improper body mechanics, Pain, Decreased endurance  Visit Diagnosis: Chronic bilateral low back pain without sciatica     Problem List Patient Active Problem List   Diagnosis Date Noted   Encounter for supervision of high risk pregnancy due to advanced maternal age in primigravida 11/26/2019   Chronic bilateral low back pain with left-sided sciatica 09/18/2019   Eczema 09/18/2019   Stage 1 chronic kidney disease 02/26/2019   Tinea pedis of both feet 02/26/2019   Tinea corporis 02/26/2019   Prediabetes    VAGINAL DISCHARGE 04/13/2010   ANEMIA 10/27/2008   VAGINITIS, BACTERIAL 10/27/2008   Essential hypertension 06/21/2008   LEG CRAMPS 06/21/2008   ENDOMETRIAL HYPERPLASIA UNSPECIFIED 01/18/2008   Morbid obesity  (HCC) 10/02/2007  ECZEMA 10/02/2007   CLUSTER HEADACHE 08/19/2007   DENTAL CARIES 08/19/2007   PCOS (polycystic ovarian syndrome) 05/27/1997     Lorrene Reid, PT 03/30/20 10:49 AM  [facility] [address], [address] [place], [place], [address] Phone: [phone]   Fax:  [phone]  Name: Donna Burns MRN: [id number] Date of Birth: [birth year]

## 2020-04-05 ENCOUNTER — Ambulatory Visit: Payer: Medicaid Other

## 2020-04-05 ENCOUNTER — Other Ambulatory Visit: Payer: Self-pay

## 2020-04-05 DIAGNOSIS — M545 Low back pain, unspecified: Secondary | ICD-10-CM | POA: Diagnosis not present

## 2020-04-05 DIAGNOSIS — G8929 Other chronic pain: Secondary | ICD-10-CM

## 2020-04-05 NOTE — Therapy (Signed)
[facility] [address], [place], [place], [address] Phone: [phone]   Fax:  [phone]  Physical Therapy Treatment  Patient Details  Name: Donna Burns MRN: [id number] Date of Birth: [birth year] Referring Provider (PT): Janyth Contes, MD   Encounter Date: 04/05/2020   PT End of Session - 04/05/20 1601    Visit Number 4    Authorization - Visit Number 4    PT Start Time 5916    PT Stop Time 1601    PT Time Calculation (min) 30 min    Activity Tolerance Patient limited by fatigue    Behavior During Therapy [facility] for tasks assessed/performed           Past Medical History:  Diagnosis Date  . Bilateral polycystic ovarian syndrome   . Chronic kidney disease   . HSV-2 (herpes simplex virus 2) infection   . Hypertension 2008  . Prediabetes   . Vaginal Pap smear, abnormal     Past Surgical History:  Procedure Laterality Date  . KNEE SURGERY Right 2015   torn cartilage--arthroscopic  . MOUTH SURGERY  2016   9 teeth pulled for decay-upper incisors.    There were no vitals filed for this visit.   Subjective Assessment - 04/05/20 1535    Subjective My Rt hamstring was bothering me so I took it easy.    Pertinent History L4/5 disc bulge and stenosis, Covid with long covid symptoms.  [redacted] weeks pregnant: due 08/03/20    Currently in Pain? No/denies   Rt hamstring 6/10 x 1 day since last session   Pain Location Back    Pain Orientation Lower;Left;Right    Pain Descriptors / Indicators Aching    Pain Type Chronic pain    Pain Onset More than a month ago    Pain Frequency Intermittent    Aggravating Factors  movement, activity, standing    Pain Relieving Factors sitting              [facility] PT Assessment - 04/05/20 0001      Assessment   Medical Diagnosis dorsalgia, chronic back pain     Referring Provider (PT) Janyth Contes, MD    Onset Date/Surgical Date 03/15/18      Cognition    Overall Cognitive Status Within Functional Limits for tasks assessed                         [facility] Adult PT Treatment/Exercise - 04/05/20 0001      Lumbar Exercises: Stretches   Active Hamstring Stretch 3 reps;20 seconds    Piriformis Stretch 3 reps;20 seconds      Lumbar Exercises: Aerobic   Nustep Level 2x 6 minutes    PT present to discuss progress     Lumbar Exercises: Seated   Sit to Stand 20 reps    Sit to Stand Limitations 2x10 with core activation    Other Seated Lumbar Exercises horizontal abduction red band with TA activation 2x10 seated      Knee/Hip Exercises: Standing   Hip Abduction Stengthening;Both;2 sets;10 reps    Hip Extension Stengthening;Both;2 sets;10 reps                    PT Short Term Goals - 03/15/20 1559      PT SHORT TERM GOAL #1   Title be independent in initial HEP    Time 4    Period Weeks    Status  New    Target Date 04/12/20      PT SHORT TERM GOAL #2   Title initiate a walking program for exercise and verbalize safe progression    Time 4    Period Weeks    Status New    Target Date 04/12/20             PT Long Term Goals - 04/05/20 1541      PT LONG TERM GOAL #1   Title be independent in advanced HEP    Status Achieved      PT LONG TERM GOAL #2   Title reduce FOTO to < or = to 39% limitation    Baseline 42% limitation    Status Partially Met      PT LONG TERM GOAL #3   Title report a 70% reduction in LBP/LE pain with standing and walking    Baseline 20% better in 4 visits    Status Partially Met      PT LONG TERM GOAL #4   Title verbalize and demonstrate body mechanics modifications for self-care and ADLs to protect lumbar spine    Status Achieved      PT LONG TERM GOAL #5   Title particiate in regular physical activity including walking and light weight training to improve endurance for community function and care of baby    Status Achieved                 Plan - 04/05/20 1550     Clinical Impression Statement Pt will discharge today to HEP for gradual progression of strength and flexibility exercises.  Pt is now consistent with regular activity and exercise 3-4x/wk and will progress to more frequency as she becomes stronger and more endurant.  FOTO is improved to 42% limitation. Session spent with review of HEP and pt was able to demonstrate all aspects correctly.  Pt will D/C to HEP and follow-up with MD as needed.    PT Next Visit Plan D/C PT    PT Home Exercise Plan Access Code: [email or web address]    Consulted and Agree with Plan of Care Patient           Patient will benefit from skilled therapeutic intervention in order to improve the following deficits and impairments:     Visit Diagnosis: Chronic bilateral low back pain without sciatica     Problem List Patient Active Problem List   Diagnosis Date Noted  . Encounter for supervision of high risk pregnancy due to advanced maternal age in primigravida 11/26/2019  . Chronic bilateral low back pain with left-sided sciatica 09/18/2019  . Eczema 09/18/2019  . Stage 1 chronic kidney disease 02/26/2019  . Tinea pedis of both feet 02/26/2019  . Tinea corporis 02/26/2019  . Prediabetes   . VAGINAL DISCHARGE 04/13/2010  . ANEMIA 10/27/2008  . VAGINITIS, BACTERIAL 10/27/2008  . Essential hypertension 06/21/2008  . LEG CRAMPS 06/21/2008  . ENDOMETRIAL HYPERPLASIA UNSPECIFIED 01/18/2008  . Morbid obesity (HCC) 10/02/2007  . ECZEMA 10/02/2007  . 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 DENTAL CARIES 08/19/2007  . PCOS (polycystic ovarian syndrome) 05/27/1997   PHYSICAL THERAPY DISCHARGE SUMMARY  Visits from Start of Care: 4  Current functional level related to goals / functional outcomes: See above for current status.  Pt reports 20% overall reduction in pain and is comfortable with current HEP and progression for strength and endurance gains.     Remaining deficits: Limited functional endurance and has HEP in place to  address   this.     Education / Equipment: HEP, posture/body mechanics Plan: Patient agrees to discharge.  Patient goals were partially met. Patient is being discharged due to being pleased with the current functional level.  ?????        Kelly Takacs, PT 04/05/20 4:04 PM  [facility] [address], [address] [place], [place], [address] Phone: [phone]   Fax:  [phone]  Name: Donna Burns MRN: [id number] Date of Birth: 09/10/1980   

## 2020-04-07 ENCOUNTER — Ambulatory Visit: Payer: Medicaid Other | Attending: Obstetrics

## 2020-04-07 ENCOUNTER — Ambulatory Visit: Payer: Medicaid Other | Admitting: *Deleted

## 2020-04-07 ENCOUNTER — Other Ambulatory Visit: Payer: Self-pay | Admitting: *Deleted

## 2020-04-07 ENCOUNTER — Encounter: Payer: Self-pay | Admitting: *Deleted

## 2020-04-07 ENCOUNTER — Other Ambulatory Visit: Payer: Self-pay

## 2020-04-07 VITALS — BP 128/76 | HR 84

## 2020-04-07 DIAGNOSIS — O4692 Antepartum hemorrhage, unspecified, second trimester: Secondary | ICD-10-CM

## 2020-04-07 DIAGNOSIS — Z362 Encounter for other antenatal screening follow-up: Secondary | ICD-10-CM

## 2020-04-07 DIAGNOSIS — O09522 Supervision of elderly multigravida, second trimester: Secondary | ICD-10-CM

## 2020-04-07 DIAGNOSIS — E669 Obesity, unspecified: Secondary | ICD-10-CM

## 2020-04-07 DIAGNOSIS — O99212 Obesity complicating pregnancy, second trimester: Secondary | ICD-10-CM

## 2020-04-07 DIAGNOSIS — B009 Herpesviral infection, unspecified: Secondary | ICD-10-CM | POA: Diagnosis not present

## 2020-04-07 DIAGNOSIS — O10012 Pre-existing essential hypertension complicating pregnancy, second trimester: Secondary | ICD-10-CM

## 2020-04-07 DIAGNOSIS — O98512 Other viral diseases complicating pregnancy, second trimester: Secondary | ICD-10-CM

## 2020-04-07 DIAGNOSIS — O09512 Supervision of elderly primigravida, second trimester: Secondary | ICD-10-CM | POA: Diagnosis present

## 2020-04-07 DIAGNOSIS — O10919 Unspecified pre-existing hypertension complicating pregnancy, unspecified trimester: Secondary | ICD-10-CM | POA: Insufficient documentation

## 2020-04-07 DIAGNOSIS — Z3A22 22 weeks gestation of pregnancy: Secondary | ICD-10-CM

## 2020-04-07 IMAGING — US US MFM OB FOLLOW-UP
3 series · 13 of 28 positions shown · non-contrast
Comparison: none

[Series 1: us mfm ob follow-up · 37 acquisitions, 8 frames shown (1 of 3)]
[im 3/37]
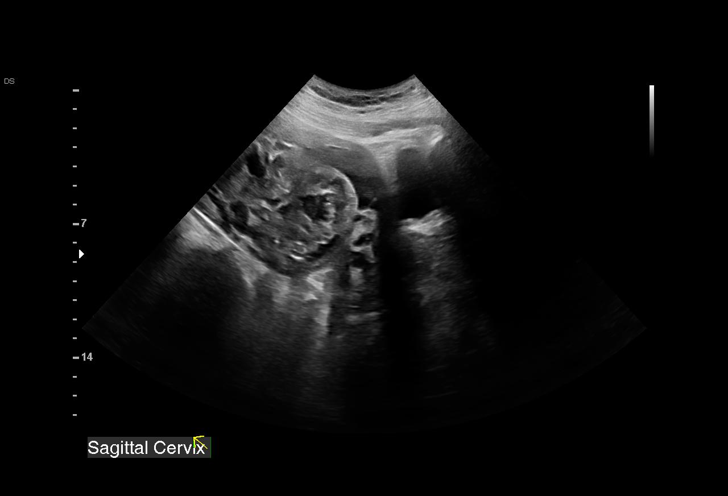
[im 7/37]
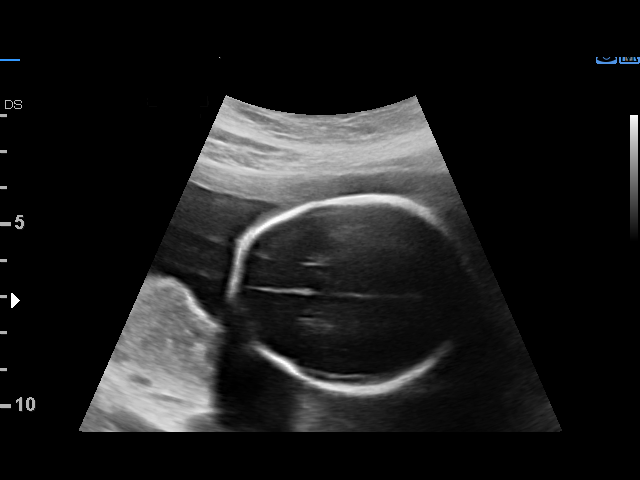
[im 12/37]
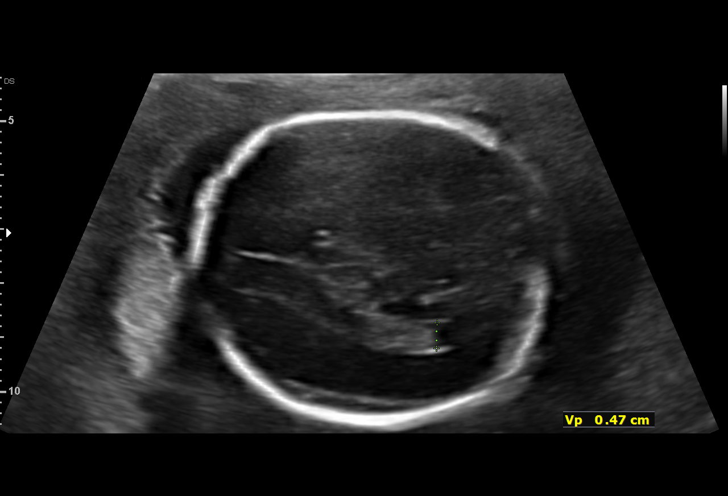
[im 16/37]
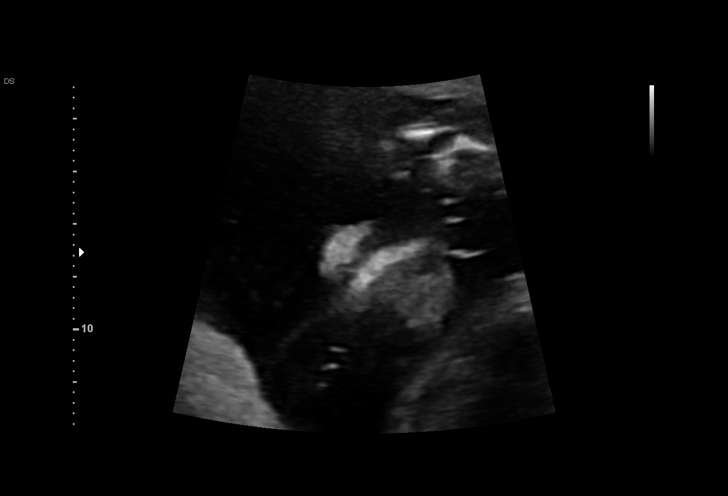
[im 21/37]
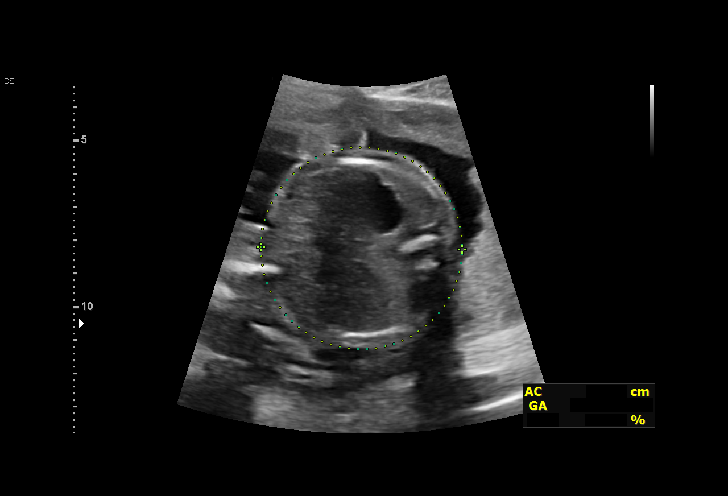
[im 25/37]
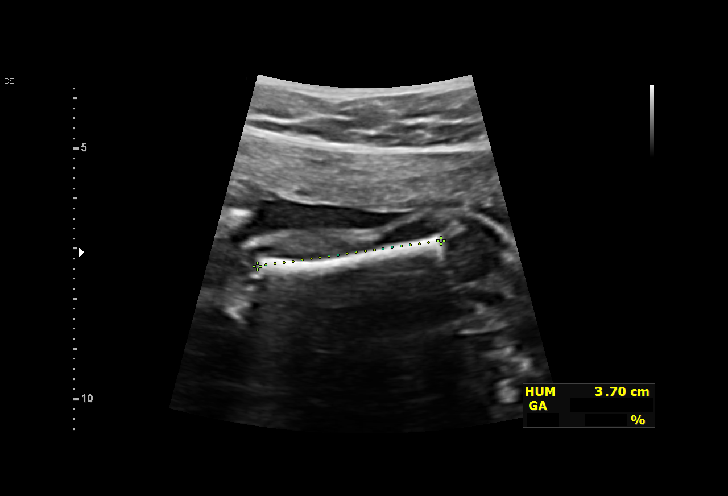
[im 32/37]
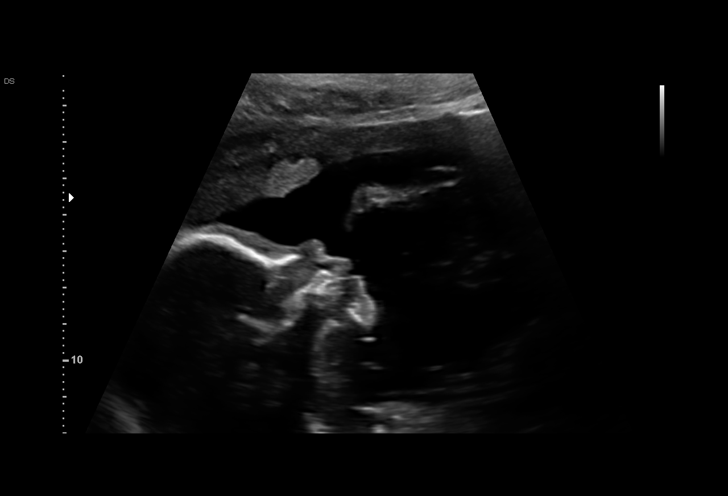
[im 37/37]
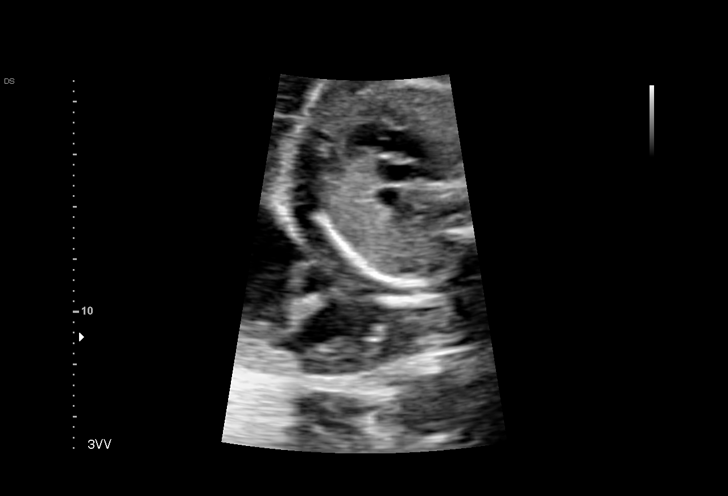

[Series 2: us mfm ob follow-up · 4 of 16 slices shown (2 of 3)]
[im 3/16]
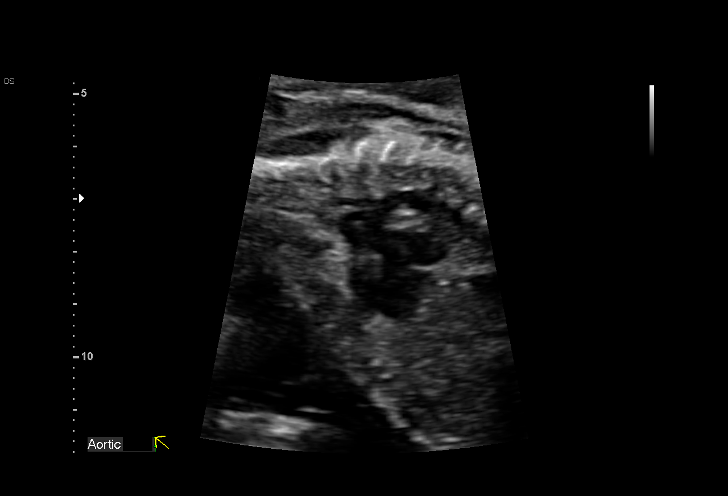
[im 7/16]
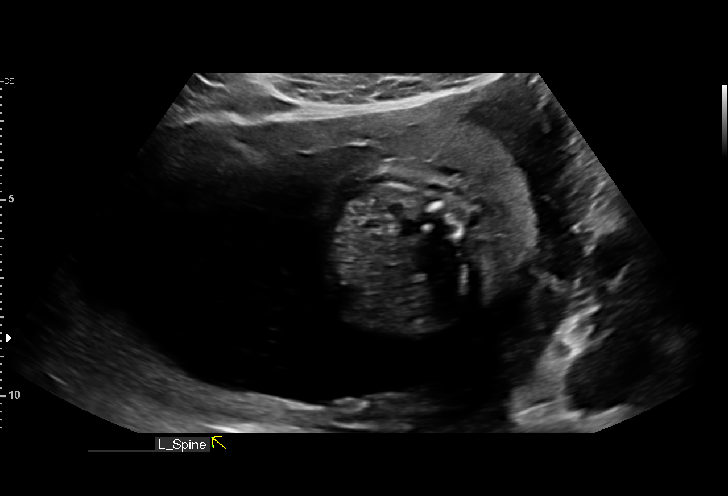
[im 11/16]
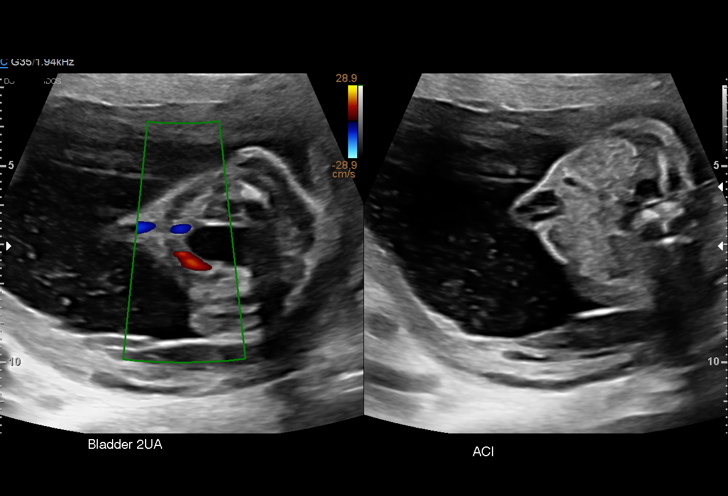
[im 16/16]
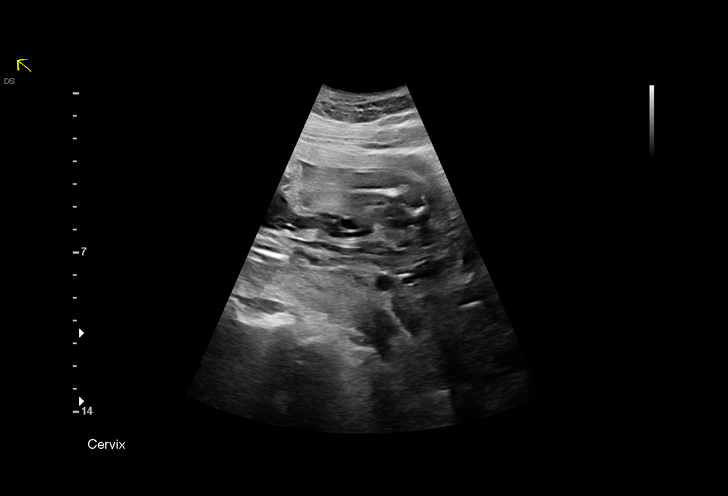

[Series 4: us mfm ob follow-up · 1 of 6 slices shown (3 of 3)]
[im 3/6]
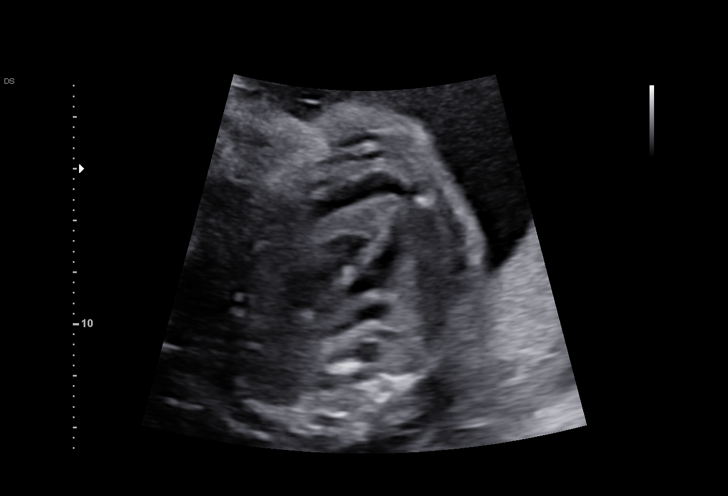

[13 of 28 positions shown; findings below may reference images not displayed]

[REDACTED]care

Indications

 Hypertension - Chronic/Pre-existing            [DQ]
 (labetalol)
 Obesity complicating pregnancy, second         [DQ]
 trimester
 Advanced maternal age multigravida 35+,        [DQ]
 second trimester
 Encounter for other antenatal screening        [DQ]
 follow-up
 Vaginal bleeding in pregnancy, second          [DQ]
 trimester
 Herpes simplex virus ([place])                     [DQ] [DQ]
 22 weeks gestation of pregnancy
Fetal Evaluation

 Num Of Fetuses:         1
 Cardiac Activity:       Observed
 Presentation:           Breech
 Placenta:               Anterior Fundal
 P. Cord Insertion:      Visualized, central

 Amniotic Fluid
 AFI FV:      Within normal limits

                             Largest Pocket(cm)

Biometry
 BPD:      56.4  mm     G. Age:  23w 2d         66  %    CI:        71.21   %    70 - 86
                                                         FL/HC:      17.6   %    19.2 -
 HC:      212.9  mm     G. Age:  23w 2d         63  %    HC/AC:      1.12        1.05 -
 AC:      189.7  mm     G. Age:  23w 5d         74  %    FL/BPD:     66.5   %    71 - 87
 FL:       37.5  mm     G. Age:  22w 0d         17  %    FL/AC:      19.8   %    20 - 24
 HUM:      37.5  mm     G. Age:  23w 1d         54  %
 CER:      24.1  mm     G. Age:  22w 1d         59  %

 LV:        4.7  mm

 Est. FW:     554  gm      1 lb 4 oz     58  %
Gestational Age

 U/S Today:     23w 1d                                        EDD:   [DATE]
 Best:          22w 5d     Det. By:  U/S C R L  ([DATE])    EDD:   [DATE]
Anatomy

 Cranium:               Appears normal         LVOT:                   Appears normal
 Cavum:                 Variant, see           Aortic Arch:            Appears normal
                        comments
 Ventricles:            Appears normal         Ductal Arch:            Previously seen
 Choroid Plexus:        Appears normal         Diaphragm:              Previously seen
 Cerebellum:            Appears normal         Stomach:                Appears normal, left
                                                                       sided
 Posterior Fossa:       Appears normal         Abdomen:                Appears normal
 Nuchal Fold:           Not applicable (>20    Abdominal Wall:         Appears nml (cord
                        wks GA)                                        insert, abd wall)
 Face:                  Appears normal         Cord Vessels:           Appears normal (3
                        (orbits and profile)                           vessel cord)
 Lips:                  Appears normal         Kidneys:                Appear normal
 Palate:                Previously seen on     Bladder:                Appears normal
                        images
 Thoracic:              Appears normal         Spine:                  Appears normal
 Heart:                 Appears normal         Upper Extremities:      Appears normal
                        (4CH, axis, and
                        situs)
 RVOT:                  Appears normal         Lower Extremities:      Appears normal

 Other:  Prominent Cavum Vergae demonstrated. Nasal bone visualized.
         Fetus appears to be a male. Heels/feet and open hands/5th digits
         visualized. VC, 3VV and 3VTV visualized.
Cervix Uterus Adnexa

 Cervix
 Length:           4.28  cm.
 Normal appearance by transabdominal scan.
Comments

 This patient was seen for a follow up exam as the fetal
 cardiac views were unable to be fully visualized during her
 prior exam.  Her pregnancy has also been complicated by
 advanced maternal age, chronic hypertension currently
 treated with labetalol and nifedipine, and chronic kidney
 disease.  She is scheduled to see her nephrologist soon.
 She denies any problems since her last exam.
 She was informed that the fetal growth and amniotic fluid
 level appears appropriate for her gestational age.
 The views of the fetal heart were visualized today.  There
 were no obvious anomalies suspected.  The limitations of
 ultrasound in the detection of all anomalies was discussed.
 Due to due to her underlying medical conditions, we will
 continue to follow her with serial growth ultrasounds.
 A follow-up exam was scheduled in 4 weeks.

## 2020-04-19 DIAGNOSIS — N183 Chronic kidney disease, stage 3 unspecified: Secondary | ICD-10-CM | POA: Diagnosis not present

## 2020-04-24 DIAGNOSIS — O09512 Supervision of elderly primigravida, second trimester: Secondary | ICD-10-CM | POA: Diagnosis not present

## 2020-04-24 DIAGNOSIS — O10012 Pre-existing essential hypertension complicating pregnancy, second trimester: Secondary | ICD-10-CM | POA: Diagnosis not present

## 2020-04-24 DIAGNOSIS — Z3A25 25 weeks gestation of pregnancy: Secondary | ICD-10-CM | POA: Diagnosis not present

## 2020-04-24 DIAGNOSIS — O99212 Obesity complicating pregnancy, second trimester: Secondary | ICD-10-CM | POA: Diagnosis not present

## 2020-04-24 DIAGNOSIS — N760 Acute vaginitis: Secondary | ICD-10-CM | POA: Diagnosis not present

## 2020-04-25 DIAGNOSIS — Z3490 Encounter for supervision of normal pregnancy, unspecified, unspecified trimester: Secondary | ICD-10-CM | POA: Diagnosis not present

## 2020-04-25 DIAGNOSIS — N183 Chronic kidney disease, stage 3 unspecified: Secondary | ICD-10-CM | POA: Diagnosis not present

## 2020-04-25 DIAGNOSIS — I129 Hypertensive chronic kidney disease with stage 1 through stage 4 chronic kidney disease, or unspecified chronic kidney disease: Secondary | ICD-10-CM | POA: Diagnosis not present

## 2020-04-30 ENCOUNTER — Other Ambulatory Visit: Payer: Self-pay | Admitting: Internal Medicine

## 2020-05-05 ENCOUNTER — Other Ambulatory Visit: Payer: Self-pay | Admitting: *Deleted

## 2020-05-05 ENCOUNTER — Ambulatory Visit: Payer: Medicaid Other | Admitting: *Deleted

## 2020-05-05 ENCOUNTER — Encounter: Payer: Self-pay | Admitting: *Deleted

## 2020-05-05 ENCOUNTER — Ambulatory Visit: Payer: Medicaid Other | Attending: Obstetrics and Gynecology

## 2020-05-05 ENCOUNTER — Other Ambulatory Visit: Payer: Self-pay

## 2020-05-05 VITALS — BP 117/74 | HR 90

## 2020-05-05 DIAGNOSIS — O98512 Other viral diseases complicating pregnancy, second trimester: Secondary | ICD-10-CM

## 2020-05-05 DIAGNOSIS — O99212 Obesity complicating pregnancy, second trimester: Secondary | ICD-10-CM | POA: Diagnosis not present

## 2020-05-05 DIAGNOSIS — Z3A26 26 weeks gestation of pregnancy: Secondary | ICD-10-CM

## 2020-05-05 DIAGNOSIS — O10919 Unspecified pre-existing hypertension complicating pregnancy, unspecified trimester: Secondary | ICD-10-CM | POA: Diagnosis not present

## 2020-05-05 DIAGNOSIS — O10912 Unspecified pre-existing hypertension complicating pregnancy, second trimester: Secondary | ICD-10-CM | POA: Diagnosis not present

## 2020-05-05 DIAGNOSIS — B009 Herpesviral infection, unspecified: Secondary | ICD-10-CM

## 2020-05-05 DIAGNOSIS — O26892 Other specified pregnancy related conditions, second trimester: Secondary | ICD-10-CM | POA: Diagnosis not present

## 2020-05-05 DIAGNOSIS — N898 Other specified noninflammatory disorders of vagina: Secondary | ICD-10-CM

## 2020-05-05 DIAGNOSIS — I10 Essential (primary) hypertension: Secondary | ICD-10-CM | POA: Insufficient documentation

## 2020-05-05 DIAGNOSIS — O09522 Supervision of elderly multigravida, second trimester: Secondary | ICD-10-CM | POA: Diagnosis not present

## 2020-05-05 DIAGNOSIS — E669 Obesity, unspecified: Secondary | ICD-10-CM | POA: Diagnosis not present

## 2020-05-09 ENCOUNTER — Telehealth: Payer: Self-pay | Admitting: Orthopaedic Surgery

## 2020-05-09 NOTE — Telephone Encounter (Signed)
Received Disability paperwork/forwarding to [employer] today

## 2020-05-11 DIAGNOSIS — Z23 Encounter for immunization: Secondary | ICD-10-CM | POA: Diagnosis not present

## 2020-05-11 DIAGNOSIS — O99212 Obesity complicating pregnancy, second trimester: Secondary | ICD-10-CM | POA: Diagnosis not present

## 2020-05-11 DIAGNOSIS — O10012 Pre-existing essential hypertension complicating pregnancy, second trimester: Secondary | ICD-10-CM | POA: Diagnosis not present

## 2020-05-11 DIAGNOSIS — Z3A27 27 weeks gestation of pregnancy: Secondary | ICD-10-CM | POA: Diagnosis not present

## 2020-05-11 DIAGNOSIS — O09512 Supervision of elderly primigravida, second trimester: Secondary | ICD-10-CM | POA: Diagnosis not present

## 2020-05-11 DIAGNOSIS — Z3689 Encounter for other specified antenatal screening: Secondary | ICD-10-CM | POA: Diagnosis not present

## 2020-05-11 DIAGNOSIS — O99810 Abnormal glucose complicating pregnancy: Secondary | ICD-10-CM | POA: Diagnosis not present

## 2020-05-17 ENCOUNTER — Ambulatory Visit: Payer: Self-pay | Admitting: Internal Medicine

## 2020-05-23 DIAGNOSIS — Z20822 Contact with and (suspected) exposure to covid-19: Secondary | ICD-10-CM | POA: Insufficient documentation

## 2020-05-24 ENCOUNTER — Inpatient Hospital Stay (HOSPITAL_COMMUNITY)
Admission: AD | Admit: 2020-05-24 | Discharge: 2020-05-24 | Disposition: A | Discharge status: Home / Self Care | Payer: Medicaid Other | Attending: Obstetrics and Gynecology | Admitting: Obstetrics and Gynecology

## 2020-05-24 ENCOUNTER — Other Ambulatory Visit: Payer: Self-pay

## 2020-05-24 ENCOUNTER — Encounter (HOSPITAL_COMMUNITY): Payer: Self-pay | Admitting: Obstetrics and Gynecology

## 2020-05-24 DIAGNOSIS — O98513 Other viral diseases complicating pregnancy, third trimester: Secondary | ICD-10-CM | POA: Diagnosis not present

## 2020-05-24 DIAGNOSIS — J029 Acute pharyngitis, unspecified: Secondary | ICD-10-CM

## 2020-05-24 DIAGNOSIS — Z3A29 29 weeks gestation of pregnancy: Secondary | ICD-10-CM | POA: Insufficient documentation

## 2020-05-24 DIAGNOSIS — O10013 Pre-existing essential hypertension complicating pregnancy, third trimester: Secondary | ICD-10-CM | POA: Diagnosis not present

## 2020-05-24 DIAGNOSIS — U071 COVID-19: Secondary | ICD-10-CM | POA: Insufficient documentation

## 2020-05-24 DIAGNOSIS — J3489 Other specified disorders of nose and nasal sinuses: Secondary | ICD-10-CM

## 2020-05-24 DIAGNOSIS — Z20822 Contact with and (suspected) exposure to covid-19: Secondary | ICD-10-CM

## 2020-05-24 DIAGNOSIS — O10913 Unspecified pre-existing hypertension complicating pregnancy, third trimester: Secondary | ICD-10-CM

## 2020-05-24 DIAGNOSIS — R52 Pain, unspecified: Secondary | ICD-10-CM

## 2020-05-24 LAB — CBC
HCT: 34.5 % — ABNORMAL LOW (ref 36.0–46.0)
Hemoglobin: 11.9 g/dL — ABNORMAL LOW (ref 12.0–15.0)
MCH: 32.1 pg (ref 26.0–34.0)
MCHC: 34.5 g/dL (ref 30.0–36.0)
MCV: 93 fL (ref 80.0–100.0)
Platelets: 165 10*3/uL (ref 150–400)
RBC: 3.71 MIL/uL — ABNORMAL LOW (ref 3.87–5.11)
RDW: 13.9 % (ref 11.5–15.5)
WBC: 8.9 10*3/uL (ref 4.0–10.5)
nRBC: 0 % (ref 0.0–0.2)

## 2020-05-24 LAB — COMPREHENSIVE METABOLIC PANEL
ALT: 29 U/L (ref 0–44)
AST: 30 U/L (ref 15–41)
Albumin: 3 g/dL — ABNORMAL LOW (ref 3.5–5.0)
Alkaline Phosphatase: 41 U/L (ref 38–126)
Anion gap: 10 (ref 5–15)
BUN: 16 mg/dL (ref 6–20)
CO2: 17 mmol/L — ABNORMAL LOW (ref 22–32)
Calcium: 9.8 mg/dL (ref 8.9–10.3)
Chloride: 109 mmol/L (ref 98–111)
Creatinine, Ser: 1.36 mg/dL — ABNORMAL HIGH (ref 0.44–1.00)
GFR, Estimated: 51 mL/min — ABNORMAL LOW (ref >60)
Glucose, Bld: 83 mg/dL (ref 70–99)
Potassium: 4.1 mmol/L (ref 3.5–5.1)
Sodium: 136 mmol/L (ref 135–145)
Total Bilirubin: 0.4 mg/dL (ref 0.3–1.2)
Total Protein: 7 g/dL (ref 6.5–8.1)

## 2020-05-24 LAB — URINALYSIS, ROUTINE W REFLEX MICROSCOPIC
Bilirubin Urine: NEGATIVE
Glucose, UA: NEGATIVE mg/dL
Hgb urine dipstick: NEGATIVE
Ketones, ur: 5 mg/dL — AB
Leukocytes,Ua: NEGATIVE
Nitrite: NEGATIVE
Protein, ur: 30 mg/dL — AB
Specific Gravity, Urine: 1.028 (ref 1.005–1.030)
pH: 5 (ref 5.0–8.0)

## 2020-05-24 LAB — PROTEIN / CREATININE RATIO, URINE
Creatinine, Urine: 352.82 mg/dL
Protein Creatinine Ratio: 0.11 mg/mg{Cre} (ref 0.00–0.15)
Total Protein, Urine: 38 mg/dL

## 2020-05-24 LAB — SARS CORONAVIRUS 2 (TAT 6-24 HRS): SARS Coronavirus 2: POSITIVE — AB

## 2020-05-24 MED ORDER — LABETALOL HCL 5 MG/ML IV SOLN
20.0000 mg | INTRAVENOUS | Status: DC | PRN
  Administered 2020-05-24: 18:00:00 20 mg via INTRAVENOUS
  Filled 2020-05-24: qty 4

## 2020-05-24 MED ORDER — ACETAMINOPHEN 500 MG PO TABS
1000.0000 mg | ORAL_TABLET | Freq: Once | ORAL | Status: AC
  Administered 2020-05-24: 20:00:00 1000 mg via ORAL
  Filled 2020-05-24: qty 2

## 2020-05-24 MED ORDER — LABETALOL HCL 200 MG PO TABS: 400.0000 mg | ORAL_TABLET | Freq: Three times a day (TID) | ORAL | 1 refills | Status: DC

## 2020-05-24 MED ORDER — HYDRALAZINE HCL 20 MG/ML IJ SOLN: 10.0000 mg | INTRAMUSCULAR | Status: DC | PRN

## 2020-05-24 MED ORDER — LABETALOL HCL 5 MG/ML IV SOLN
40.0000 mg | INTRAVENOUS | Status: DC | PRN
  Administered 2020-05-24: 19:00:00 40 mg via INTRAVENOUS
  Filled 2020-05-24: qty 8

## 2020-05-24 MED ORDER — LABETALOL HCL 5 MG/ML IV SOLN
80.0000 mg | INTRAVENOUS | Status: DC | PRN
  Administered 2020-05-24: 20:00:00 80 mg via INTRAVENOUS
  Filled 2020-05-24: qty 16

## 2020-05-24 NOTE — MAU Note (Signed)
Husband tested positive today to Covid.  Is concerned, because the baby has been moving a lot, more than usual, though it might be related. No abd pain. Sore throat, ear popping, runny nose, sneezing.  Called office, was told to get tested and and reschedule her appt. Denies fever

## 2020-05-24 NOTE — MAU Provider Note (Signed)
Chief Complaint:  Sore Throat, Nasal Congestion, sneezing, increased fetal movement, and Covid Exposure   None     HPI: Donna Burns is a 39 y.o. G1P0000 at [redacted]w[redacted]d with CHTN on medications who presents to maternity admissions reporting exposure to COVID with onset of sore throat, nasal congestion, sneezing yesterday.  She reports that her brother is positive and now her husband is positive with symptoms as well.  She is feeling increased fetal movement since yesterday and was concerned so she came to MAU. She reports that she and her husband have had the COVID vaccine x 2 and she also received her booster vaccine.  She denies any shortness of breath or chest pain.  She has not tried any treatments for her symptoms. She reports increased stress because her husband is short of breath and weak and is sleeping right now in their [name] because he can't go up the 3 flights of stairs to their apartment.     HPI  Past Medical History: Past Medical History:  Diagnosis Date  . Bilateral polycystic ovarian syndrome   . Chronic kidney disease   . HSV-2 (herpes simplex virus 2) infection   . Hypertension 2008  . Prediabetes   . Vaginal Pap smear, abnormal     Past obstetric history: OB History  Gravida Para Term Preterm AB Living  1 0 0 0 0 0  SAB IAB Ectopic Multiple Live Births  0 0 0 0 0    # Outcome Date GA Lbr Len/2nd Weight Sex Delivery Anes PTL Lv  1 Current             Past Surgical History: Past Surgical History:  Procedure Laterality Date  . KNEE SURGERY Right 2015   torn cartilage--arthroscopic  . MOUTH SURGERY  2016   9 teeth pulled for decay-upper incisors.    Family History: Family History  Problem Relation Age of Onset  . Heart failure Mother   . Diabetes Mother   . Hypertension Mother   . Breast cancer Mother   . Stroke Father        Nursing home bound  . Hypertension Father   . Dementia Father        from strokes  . Other Sister        Prediabetes  .  Hypertension Sister     Social History: Social History   Tobacco Use  . Smoking status: Never Smoker  . Smokeless tobacco: Never Used  Vaping Use  . Vaping Use: Never used  Substance Use Topics  . Alcohol use: Never  . Drug use: Never    Allergies:  Allergies  Allergen Reactions  . No Known Allergies   . Pork-Derived Products Other (See Comments)    Pt does not eat pork or use pork-derived products    Meds:  Medications Prior to Admission  Medication Sig Dispense Refill Last Dose  . labetalol (NORMODYNE) 200 MG tablet TAKE 2 TABLETS BY MOUTH TWICE DAILY. 120 tablet 6 05/24/2020 at Unknown time  . NIFEdipine (ADALAT CC) 60 MG 24 hr tablet Take 60 mg by mouth daily.   05/23/2020 at Unknown time  . Prenatal Vit w/Fe-Methylfol-FA (PNV PO) Take by mouth.   05/23/2020 at Unknown time  . acetaminophen-codeine (TYLENOL #3) 300-30 MG tablet Take 1 tablet by mouth 3 (three) times daily as needed for moderate pain. (Patient not taking: Reported on 04/07/2020) 30 tablet 1     ROS:  Review of Systems  Constitutional: Negative for chills, fatigue  and fever.  HENT: Positive for congestion, rhinorrhea, sneezing and sore throat.   Eyes: Negative for visual disturbance.  Respiratory: Negative for shortness of breath.   Cardiovascular: Negative for chest pain.  Gastrointestinal: Negative for abdominal pain, nausea and vomiting.  Genitourinary: Negative for difficulty urinating, dysuria, flank pain, pelvic pain, vaginal bleeding, vaginal discharge and vaginal pain.  Neurological: Negative for dizziness and headaches.  Psychiatric/Behavioral: Negative.      I have reviewed patient's Past Medical Hx, Surgical Hx, Family Hx, Social Hx, medications and allergies.   Physical Exam   Patient Vitals for the past 24 hrs:  BP Temp Temp src Pulse Resp SpO2 Height Weight  05/24/20 1802 (!) 146/97 -- -- (!) 101 -- -- -- --  05/24/20 1800 -- -- -- -- -- 99 % -- --  05/24/20 1757 (!) 143/96 -- --  (!) 109 -- -- -- --  05/24/20 1746 (!) 152/116 -- -- (!) 115 -- -- -- --  05/24/20 1745 -- -- -- -- -- 100 % -- --  05/24/20 1731 (!) 154/93 -- -- (!) 111 -- -- -- --  05/24/20 1730 -- -- -- -- -- 99 % -- --  05/24/20 1716 (!) 152/92 -- -- (!) 110 -- 99 % -- --  05/24/20 1705 -- -- -- -- -- 99 % -- --  05/24/20 1701 (!) 160/108 -- -- (!) 117 -- -- -- --  05/24/20 1700 -- -- -- -- -- 100 % -- --  05/24/20 1644 (!) 152/98 -- -- (!) 117 -- -- -- --  05/24/20 1614 (!) 143/97 99.1 F (37.3 C) Oral (!) 116 20 98 % 5\' 7"  (1.702 m) 131.5 kg  05/24/20 1612 -- -- -- -- -- 100 % -- --   Constitutional: Well-developed, well-nourished female in no acute distress.  Cardiovascular: normal rate Respiratory: normal effort GI: Abd soft, non-tender, gravid appropriate for gestational age.  MS: Extremities nontender, no edema, normal ROM Neurologic: Alert and oriented x 4.  GU: Neg CVAT.      FHT:  Baseline 150, moderate variability, accelerations present, no decelerations Contractions: None on toco or to palpation   Labs: Results for orders placed or performed during the hospital encounter of 05/24/20 (from the past 24 hour(s))  Urinalysis, Routine w reflex microscopic Nasopharyngeal Swab     Status: Abnormal   Collection Time: 05/24/20  4:51 PM  Result Value Ref Range   Color, Urine YELLOW YELLOW   APPearance HAZY (A) CLEAR   Specific Gravity, Urine 1.028 1.005 - 1.030   pH 5.0 5.0 - 8.0   Glucose, UA NEGATIVE NEGATIVE mg/dL   Hgb urine dipstick NEGATIVE NEGATIVE   Bilirubin Urine NEGATIVE NEGATIVE   Ketones, ur 5 (A) NEGATIVE mg/dL   Protein, ur 30 (A) NEGATIVE mg/dL   Nitrite NEGATIVE NEGATIVE   Leukocytes,Ua NEGATIVE NEGATIVE   RBC / HPF 0-5 0 - 5 RBC/hpf   WBC, UA 0-5 0 - 5 WBC/hpf   Bacteria, UA RARE (A) NONE SEEN   Squamous Epithelial / LPF 0-5 0 - 5   Mucus PRESENT   CBC     Status: Abnormal   Collection Time: 05/24/20  5:16 PM  Result Value Ref Range   WBC 8.9 4.0 - 10.5  K/uL   RBC 3.71 (L) 3.87 - 5.11 MIL/uL   Hemoglobin 11.9 (L) 12.0 - 15.0 g/dL   HCT 16.1 (L) 09.6 - 04.5 %   MCV 93.0 80.0 - 100.0 fL   MCH 32.1 26.0 -  34.0 pg   MCHC 34.5 30.0 - 36.0 g/dL   RDW 16.113.9 09.611.5 - 04.515.5 %   Platelets 165 150 - 400 K/uL   nRBC 0.0 0.0 - 0.2 %   --/--/A POS (07/13 1041)  Imaging:  US MFM OB FOLLOW UP  Result Date: 05/05/2020 ----------------------------------------------------------------------  OBSTETRICS REPORT                       (Signed Final 05/05/2020 12:44 pm) ---------------------------------------------------------------------- Patient Info  ID #:       [id number]                          D.O.B.:  Jul 24, 1980 (39 yrs)  Name:       Gala LewandowskyESIRAY Driscoll                Visit Date: 05/05/2020 11:20 am ---------------------------------------------------------------------- Performed By  Attending:        Noralee Spaceavi Shankar MD        Ref. Address:     [address]                                                             [address]  Performed By:     Jenel Luckshelsea Shortridge     [facility]  Referred By:      Yehuda MaoECILIA WOREM                    BANGA MD ---------------------------------------------------------------------- Orders  #  Description                           Code        Ordered By  1  US MFM OB FOLLOW UP                   [id number]    Rosana HoesYU FANG ----------------------------------------------------------------------  #  Order #                     Accession #  Episode #  1  [id number]                   [id number]                 [id number] ---------------------------------------------------------------------- Indications  [redacted] weeks gestation of pregnancy                Z3A.26  Hypertension - Chronic/Pre-existing            O10.019  Obesity  complicating pregnancy, second         O99.212  trimester  Advanced maternal age multigravida 12+,        O31.522  second trimester  Vaginal discharge during pregnancy in          O26.892  second trimester  Herpes simplex virus (HSV)                     O98.519 B00.9 ---------------------------------------------------------------------- Fetal Evaluation  Num Of Fetuses:         1  Fetal Heart Rate(bpm):  137  Cardiac Activity:       Observed  Presentation:           Cephalic  Placenta:               Anterior Fundal  P. Cord Insertion:      Previously Visualized  Amniotic Fluid  AFI FV:      Within normal limits                              Largest Pocket(cm)                              3.47 ---------------------------------------------------------------------- Biometry  BPD:      69.3  mm     G. Age:  27w 6d         77  %    CI:        76.25   %    70 - 86                                                          FL/HC:      18.2   %    18.6 - 20.4  HC:      251.5  mm     G. Age:  27w 2d         43  %    HC/AC:      1.14        1.05 - 1.21  AC:      221.5  mm     G. Age:  26w 4d         37  %    FL/BPD:     66.1   %    71 - 87  FL:       45.8  mm     G. Age:  25w 1d          5  %    FL/AC:      20.7   %    20 - 24  HUM:      45.5  mm  G. Age:  26w 6d         51  %  LV:        5.7  mm  Est. FW:     909  gm           2 lb     21  % ---------------------------------------------------------------------- Gestational Age  U/S Today:     26w 5d                                        EDD:   08/06/20  Best:          26w 5d     Det. By:  U/S C R L  (12/23/19)    EDD:   08/06/20 ---------------------------------------------------------------------- Anatomy  Cranium:               Appears normal         LVOT:                   Previously seen  Cavum:                 Not well visualized    Aortic Arch:            Previously seen  Ventricles:            Appears normal         Ductal Arch:            Previously seen  Choroid  Plexus:        Previously seen        Diaphragm:              Appears normal  Cerebellum:            Previously seen        Stomach:                Appears normal, left                                                                        sided  Posterior Fossa:       Previously seen        Abdomen:                Appears normal  Nuchal Fold:           Not applicable (>20    Abdominal Wall:         Appears nml (cord                         wks GA)                                        insert, abd wall)  Face:                  Orbits and profile     Cord Vessels:           Previously seen  previously seen  Lips:                  Previously seen        Kidneys:                Appear normal  Palate:                Previously seen on     Bladder:                Appears normal                         images  Thoracic:              Previously seen        Spine:                  Previously seen  Heart:                 Previously seen        Upper Extremities:      Previously seen  RVOT:                  Previously seen        Lower Extremities:      Previously seen  Other:  Prominent Cavum Vergae demonstrated. Nasal bone visualized.          Fetus appears to be a female. Heels/feet and open hands/5th digits          visualized. VC, 3VV and 3VTV visualized. ---------------------------------------------------------------------- Cervix Uterus Adnexa  Cervix  Normal appearance by transabdominal scan.  Uterus  No abnormality visualized.  Right Ovary  Not visualized.  Left Ovary  Not visualized.  Cul De Sac  No free fluid seen.  Adnexa  No abnormality visualized. ---------------------------------------------------------------------- Impression  Patient with chronic hypertension and chronic kidney disease  returns for fetal growth assessment.  She is being followed by  her nephrologist.  Recent creatinine level is 1.3 mg/dL.  Early screening ruled out gestational diabetes.  Blood  pressure today at her  office is 117/74 mmHg.  She takes  labetalol and nifedipine.  Amniotic fluid is normal and good fetal activity is seen .Fetal  growth is appropriate for gestational age .  She is being followed at [facility]. ---------------------------------------------------------------------- Recommendations  -An appointment was made for her to return in 4 weeks for  fetal growth assessment.  -Weekly BPP from [redacted] weeks gestation till delivery. ----------------------------------------------------------------------                  Noralee Space, MD Electronically Signed Final Report   05/05/2020 12:44 pm ----------------------------------------------------------------------   MAU Course/MDM: Orders Placed This Encounter  Procedures  . SARS CORONAVIRUS 2 (TAT 6-24 HRS) Nasopharyngeal Nasopharyngeal Swab  . CBC  . Comprehensive metabolic panel  . Protein / creatinine ratio, urine  . Urinalysis, Routine w reflex microscopic Urine, Clean Catch  . Notify Physician  . Measure blood pressure    Meds ordered this encounter  Medications  . AND Linked Order Group   . labetalol (NORMODYNE) injection 20 mg   . labetalol (NORMODYNE) injection 40 mg   . labetalol (NORMODYNE) injection 80 mg   . hydrALAZINE (APRESOLINE) injection 10 mg     NST reviewed and reactive Given mild symptoms and known exposure, COVID test with 6-24 hour turn around time ordered BP elevated today, pt nifedipine dose due  so pt took her home dose Severe range BP x 2 so IV started and labetalol given No s/sx of PEC PEC labs wnl Consult Dr Despina Hidden with presentation, exam findings and test results.  Increase Labetalol to 400 TID and continue nifedipine daily COVID and pregnancy precautions given F/U with prenatal care in 10+ days if COVID symptoms remain mild Return to MAU with emergencies  Assessment:  1. Close exposure to COVID-19 virus   2. Rhinorrhea   3. Sore throat   4. Body aches   5. Chronic hypertension complicating or  reason for care during pregnancy, third trimester   6. [redacted] weeks gestation of pregnancy    Plan: Discharge home Labor precautions and fetal kick counts    Sharen Counter Certified Nurse-Midwife 05/24/2020 6:06 PM

## 2020-05-25 ENCOUNTER — Encounter (HOSPITAL_COMMUNITY): Payer: Self-pay

## 2020-05-25 DIAGNOSIS — U071 COVID-19: Secondary | ICD-10-CM | POA: Insufficient documentation

## 2020-06-09 ENCOUNTER — Encounter: Payer: Self-pay | Admitting: *Deleted

## 2020-06-09 ENCOUNTER — Ambulatory Visit: Payer: Medicaid Other | Attending: Obstetrics and Gynecology

## 2020-06-09 ENCOUNTER — Ambulatory Visit: Payer: Medicaid Other | Admitting: *Deleted

## 2020-06-09 ENCOUNTER — Other Ambulatory Visit: Payer: Self-pay

## 2020-06-09 VITALS — BP 132/82 | HR 84

## 2020-06-09 DIAGNOSIS — O98513 Other viral diseases complicating pregnancy, third trimester: Secondary | ICD-10-CM

## 2020-06-09 DIAGNOSIS — O10913 Unspecified pre-existing hypertension complicating pregnancy, third trimester: Secondary | ICD-10-CM

## 2020-06-09 DIAGNOSIS — O26893 Other specified pregnancy related conditions, third trimester: Secondary | ICD-10-CM | POA: Diagnosis not present

## 2020-06-09 DIAGNOSIS — Z362 Encounter for other antenatal screening follow-up: Secondary | ICD-10-CM

## 2020-06-09 DIAGNOSIS — B009 Herpesviral infection, unspecified: Secondary | ICD-10-CM | POA: Diagnosis not present

## 2020-06-09 DIAGNOSIS — O10912 Unspecified pre-existing hypertension complicating pregnancy, second trimester: Secondary | ICD-10-CM | POA: Diagnosis not present

## 2020-06-09 DIAGNOSIS — Z3A31 31 weeks gestation of pregnancy: Secondary | ICD-10-CM

## 2020-06-09 DIAGNOSIS — E669 Obesity, unspecified: Secondary | ICD-10-CM | POA: Diagnosis not present

## 2020-06-09 DIAGNOSIS — O09523 Supervision of elderly multigravida, third trimester: Secondary | ICD-10-CM | POA: Diagnosis not present

## 2020-06-09 DIAGNOSIS — O99213 Obesity complicating pregnancy, third trimester: Secondary | ICD-10-CM | POA: Diagnosis not present

## 2020-06-09 IMAGING — US US MFM OB FOLLOW-UP
1 series · 14 of 28 positions shown · non-contrast
Comparison: none

[Series 1: us mfm ob follow-up · 48 acquisitions, 14 frames shown]
[im 2/48]
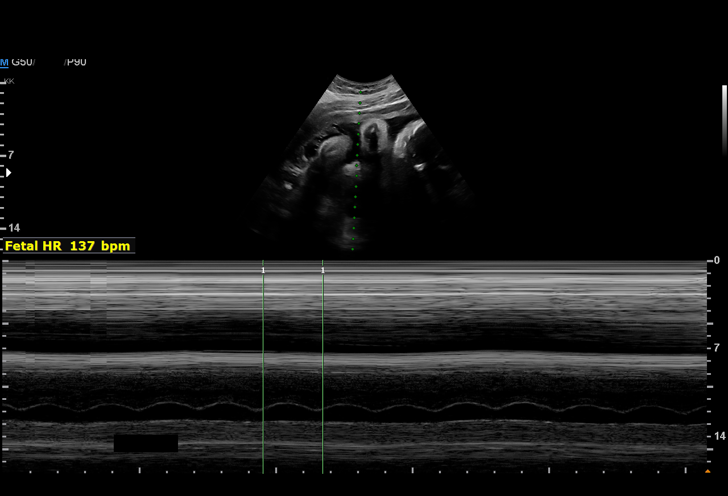
[im 6/48]
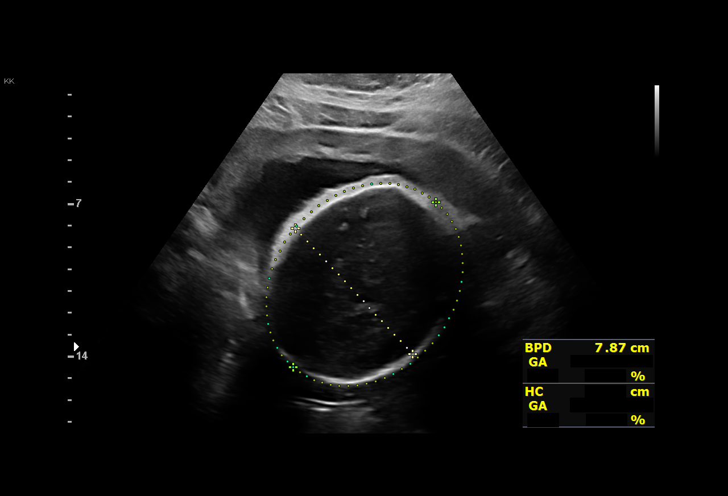
[im 9/48]
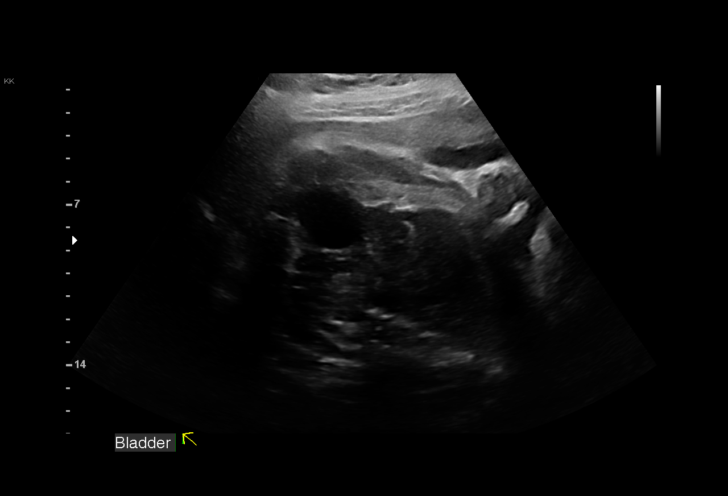
[im 13/48]
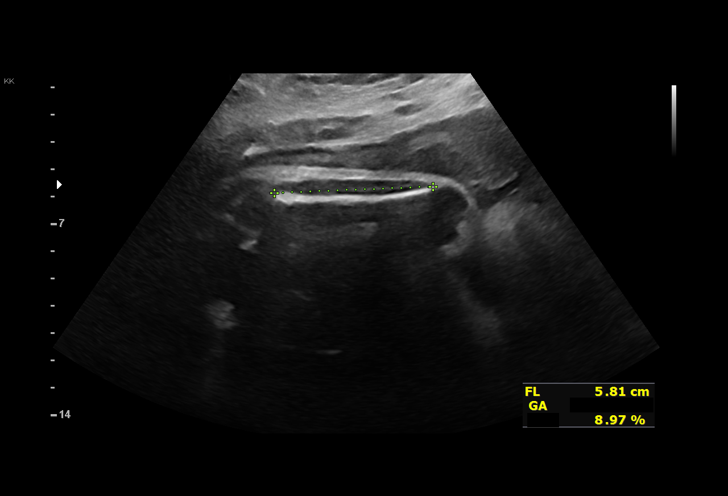
[im 16/48]
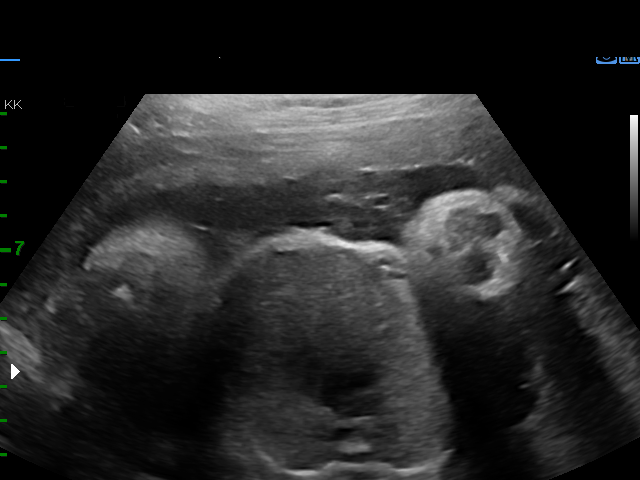
[im 20/48]
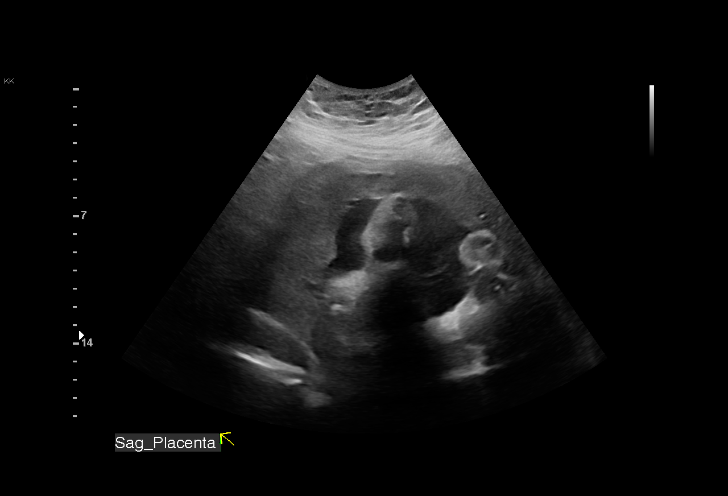
[im 23/48]
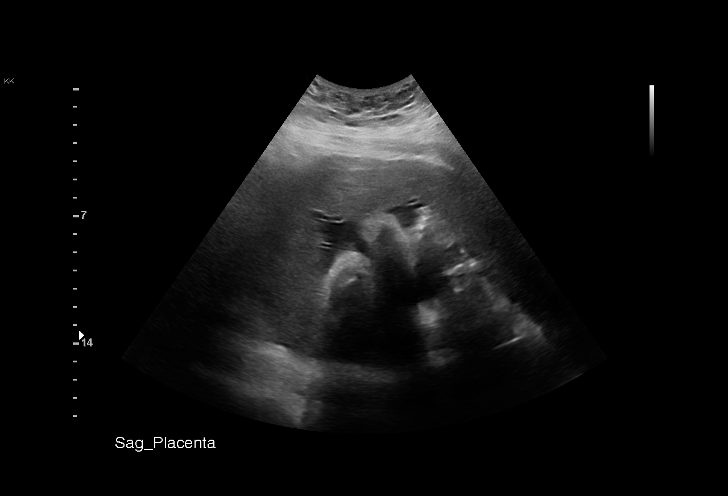
[im 27/48]
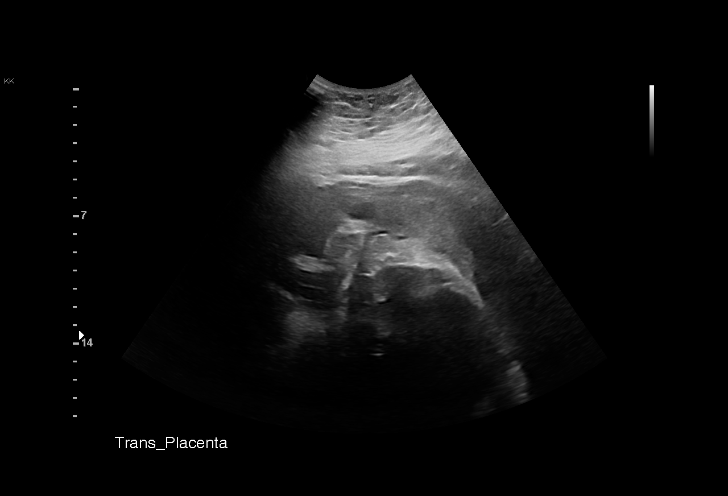
[im 30/48]
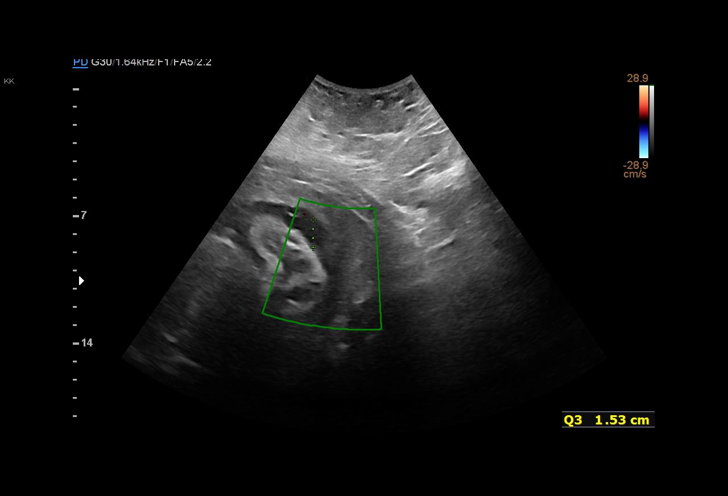
[im 34/48]
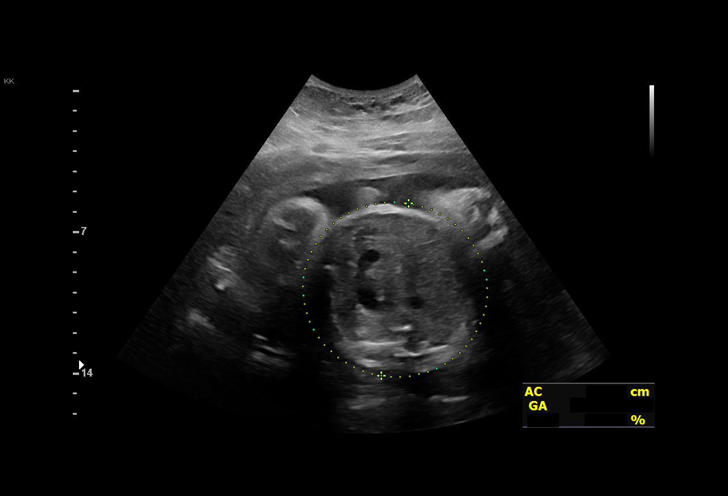
[im 37/48]
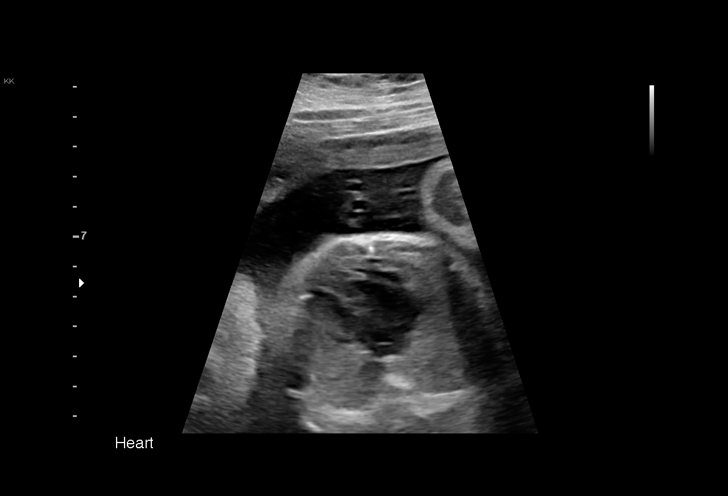
[im 41/48]
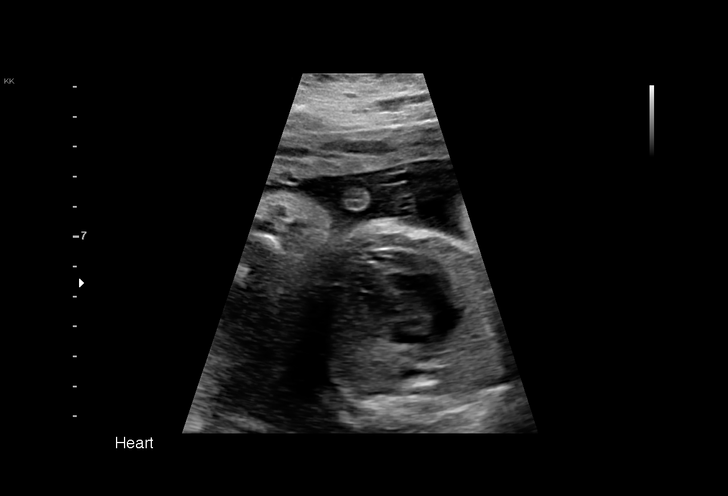
[im 44/48]
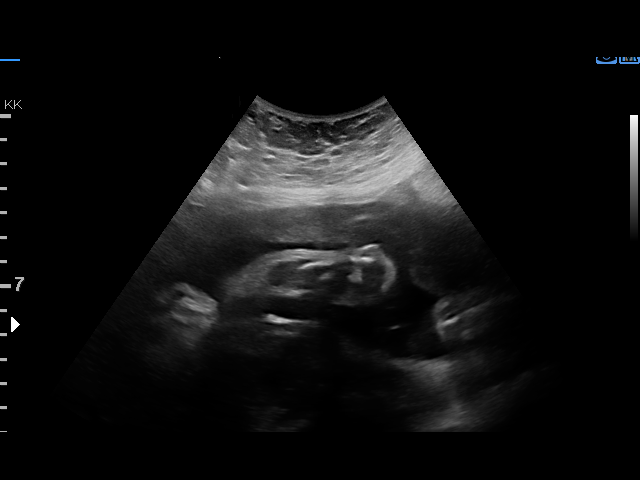
[im 48/48]
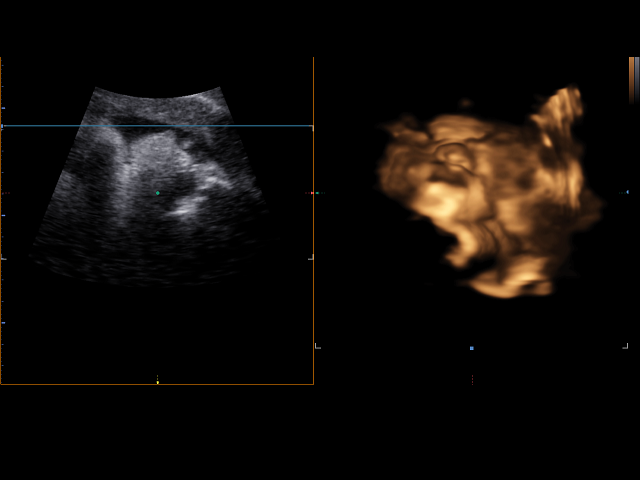

[14 of 28 positions shown; findings below may reference images not displayed]

#101

Indications

 Hypertension - Chronic/Pre-                    [8Q]
 existing(Labetalol)
 Obesity complicating pregnancy, second         [8Q]
 trimester
 Advanced maternal age multigravida 35+,        [8Q]
 second trimester
 Vaginal discharge during pregnancy in          [8Q]
 second trimester
 Herpes simplex virus (KROSAN)                     [8Q] [8Q]
 31 weeks gestation of pregnancy
 Encounter for other antenatal screening        [8Q]
 follow-up
Fetal Evaluation

 Num Of Fetuses:         1
 Fetal Heart Rate(bpm):  137
 Cardiac Activity:       Observed
 Presentation:           Cephalic
 Placenta:               Anterior Fundal
 P. Cord Insertion:      Previously Visualized

 Amniotic Fluid
 AFI FV:      Within normal limits

 AFI Sum(cm)     %Tile       Largest Pocket(cm)
 11.14           24

 RUQ(cm)       RLQ(cm)       LUQ(cm)        LLQ(cm)

Biometry

 BPD:      80.2  mm     G. Age:  32w 1d         56  %    CI:        78.12   %    70 - 86
                                                         FL/HC:      19.9   %    19.1 -
 HC:      287.1  mm     G. Age:  31w 4d         12  %    HC/AC:      1.03        0.96 -
 AC:       278   mm     G. Age:  31w 6d         52  %    FL/BPD:     71.2   %    71 - 87
 FL:       57.1  mm     G. Age:  30w 0d        4.7  %    FL/AC:      20.5   %    20 - 24

 Est. FW:    [8Q]  gm    3 lb 13 oz      25  %
Gestational Age

 U/S Today:     31w 3d                                        EDD:   [DATE]
 Best:          31w 5d     Det. By:  U/S C R L  ([DATE])    EDD:   [DATE]
Anatomy

 Cranium:               Appears normal         LVOT:                   Previously seen
 Cavum:                 Not well visualized    Aortic Arch:            Previously seen
 Ventricles:            Appears normal         Ductal Arch:            Previously seen
 Choroid Plexus:        Previously seen        Diaphragm:              Previously seen
 Cerebellum:            Previously seen        Stomach:                Appears normal, left
                                                                       sided
 Posterior Fossa:       Previously seen        Abdomen:                Appears normal
 Nuchal Fold:           Not applicable (>20    Abdominal Wall:         Previously seen
                        wks GA)
 Face:                  Orbits and profile     Cord Vessels:           Previously seen
                        previously seen
 Lips:                  Previously seen        Kidneys:                Appear normal
 Palate:                Previously seen on     Bladder:                Appears normal
                        images
 Thoracic:              Previously seen        Spine:                  Previously seen
 Heart:                 Previously seen        Upper Extremities:      Previously seen
 RVOT:                  Previously seen        Lower Extremities:      Previously seen

 Other:  Prominent Cavum Vergae demonstrated. Nasal bone visualized.
         Fetus appears to be a male. Heels/feet and open hands/5th digits
         visualized. VC, 3VV and 3VTV visualized.
Cervix Uterus Adnexa

 Cervix
 Not visualized (advanced GA >[8Q])
Comments

 This patient was seen for a follow up growth scan due to
 chronic hypertension currently treated with labetalol (600 mg
 daily).  She was diagnosed with [8Q] at the end [DATE].  She reports that she has already received the
 complete course of the [8Q] vaccine including the
 booster shot.  She denies any symptoms currently.
 She was informed that the fetal growth and amniotic fluid
 level appears appropriate for her gestational age.
 Due to chronic hypertension, she is already scheduled to
 start twice weekly fetal testing in your office next week.  She
 should continue these tests until delivery.
 A follow up exam was scheduled in 4 weeks to assess the
 fetal growth.

## 2020-06-09 NOTE — Progress Notes (Signed)
Last dose Labetalol 0700 per pt

## 2020-06-12 DIAGNOSIS — N183 Chronic kidney disease, stage 3 unspecified: Secondary | ICD-10-CM | POA: Diagnosis not present

## 2020-06-13 ENCOUNTER — Encounter (HOSPITAL_COMMUNITY): Payer: Self-pay | Admitting: Obstetrics and Gynecology

## 2020-06-13 ENCOUNTER — Observation Stay (HOSPITAL_COMMUNITY)
Admission: AD | Admit: 2020-06-13 | Discharge: 2020-06-14 | Disposition: A | Discharge status: Home / Self Care | Payer: Medicaid Other | Attending: Obstetrics and Gynecology | Admitting: Obstetrics and Gynecology

## 2020-06-13 ENCOUNTER — Other Ambulatory Visit: Payer: Self-pay

## 2020-06-13 ENCOUNTER — Other Ambulatory Visit: Payer: Self-pay | Admitting: *Deleted

## 2020-06-13 DIAGNOSIS — Z79899 Other long term (current) drug therapy: Secondary | ICD-10-CM | POA: Diagnosis not present

## 2020-06-13 DIAGNOSIS — N183 Chronic kidney disease, stage 3 unspecified: Secondary | ICD-10-CM | POA: Diagnosis not present

## 2020-06-13 DIAGNOSIS — E669 Obesity, unspecified: Secondary | ICD-10-CM | POA: Insufficient documentation

## 2020-06-13 DIAGNOSIS — O368331 Maternal care for abnormalities of the fetal heart rate or rhythm, third trimester, fetus 1: Secondary | ICD-10-CM | POA: Insufficient documentation

## 2020-06-13 DIAGNOSIS — Z3A32 32 weeks gestation of pregnancy: Secondary | ICD-10-CM | POA: Diagnosis not present

## 2020-06-13 DIAGNOSIS — O10013 Pre-existing essential hypertension complicating pregnancy, third trimester: Secondary | ICD-10-CM | POA: Diagnosis present

## 2020-06-13 DIAGNOSIS — Z8616 Personal history of COVID-19: Secondary | ICD-10-CM | POA: Insufficient documentation

## 2020-06-13 DIAGNOSIS — R7303 Prediabetes: Secondary | ICD-10-CM | POA: Diagnosis not present

## 2020-06-13 DIAGNOSIS — Z7982 Long term (current) use of aspirin: Secondary | ICD-10-CM | POA: Insufficient documentation

## 2020-06-13 DIAGNOSIS — O368390 Maternal care for abnormalities of the fetal heart rate or rhythm, unspecified trimester, not applicable or unspecified: Secondary | ICD-10-CM | POA: Diagnosis present

## 2020-06-13 DIAGNOSIS — O368120 Decreased fetal movements, second trimester, not applicable or unspecified: Secondary | ICD-10-CM | POA: Diagnosis not present

## 2020-06-13 DIAGNOSIS — O09513 Supervision of elderly primigravida, third trimester: Secondary | ICD-10-CM

## 2020-06-13 DIAGNOSIS — I129 Hypertensive chronic kidney disease with stage 1 through stage 4 chronic kidney disease, or unspecified chronic kidney disease: Secondary | ICD-10-CM | POA: Insufficient documentation

## 2020-06-13 DIAGNOSIS — O99213 Obesity complicating pregnancy, third trimester: Secondary | ICD-10-CM | POA: Diagnosis not present

## 2020-06-13 DIAGNOSIS — Z9289 Personal history of other medical treatment: Secondary | ICD-10-CM

## 2020-06-13 DIAGNOSIS — O133 Gestational [pregnancy-induced] hypertension without significant proteinuria, third trimester: Secondary | ICD-10-CM | POA: Diagnosis not present

## 2020-06-13 DIAGNOSIS — O10213 Pre-existing hypertensive chronic kidney disease complicating pregnancy, third trimester: Principal | ICD-10-CM | POA: Diagnosis present

## 2020-06-13 DIAGNOSIS — O10913 Unspecified pre-existing hypertension complicating pregnancy, third trimester: Secondary | ICD-10-CM

## 2020-06-13 LAB — PROTEIN / CREATININE RATIO, URINE
Creatinine, Urine: 93.56 mg/dL
Protein Creatinine Ratio: 0.12 mg/mg{Cre} (ref 0.00–0.15)
Total Protein, Urine: 11 mg/dL

## 2020-06-13 LAB — HEPATIC FUNCTION PANEL
ALT: 19 U/L (ref 0–44)
AST: 19 U/L (ref 15–41)
Albumin: 2.5 g/dL — ABNORMAL LOW (ref 3.5–5.0)
Alkaline Phosphatase: 40 U/L (ref 38–126)
Bilirubin, Direct: <0.1 mg/dL (ref 0.0–0.2)
Total Bilirubin: 0.3 mg/dL (ref 0.3–1.2)
Total Protein: 6.6 g/dL (ref 6.5–8.1)

## 2020-06-13 LAB — TYPE AND SCREEN
ABO/RH(D): A POS
Antibody Screen: NEGATIVE

## 2020-06-13 MED ORDER — ASPIRIN 81 MG PO CHEW
81.0000 mg | CHEWABLE_TABLET | Freq: Every day | ORAL | Status: DC
  Filled 2020-06-13: qty 1

## 2020-06-13 MED ORDER — BETAMETHASONE SOD PHOS & ACET 6 (3-3) MG/ML IJ SUSP
12.0000 mg | INTRAMUSCULAR | Status: AC
  Administered 2020-06-13 – 2020-06-14 (×2): 12 mg via INTRAMUSCULAR
  Filled 2020-06-13 (×2): qty 5

## 2020-06-13 MED ORDER — CALCIUM CARBONATE ANTACID 500 MG PO CHEW: 2.0000 | CHEWABLE_TABLET | ORAL | Status: DC | PRN

## 2020-06-13 MED ORDER — ACETAMINOPHEN 325 MG PO TABS: 650.0000 mg | ORAL_TABLET | ORAL | Status: DC | PRN

## 2020-06-13 MED ORDER — ZOLPIDEM TARTRATE 5 MG PO TABS: 5.0000 mg | ORAL_TABLET | Freq: Every evening | ORAL | Status: DC | PRN

## 2020-06-13 MED ORDER — DOCUSATE SODIUM 100 MG PO CAPS
100.0000 mg | ORAL_CAPSULE | Freq: Every day | ORAL | Status: DC
  Administered 2020-06-13: 100 mg via ORAL
  Filled 2020-06-13: qty 1

## 2020-06-13 MED ORDER — ASPIRIN EC 81 MG PO TBEC
81.0000 mg | DELAYED_RELEASE_TABLET | Freq: Every day | ORAL | Status: DC
  Administered 2020-06-13: 81 mg via ORAL
  Filled 2020-06-13: qty 1

## 2020-06-13 MED ORDER — PRENATAL MULTIVITAMIN CH
1.0000 | ORAL_TABLET | Freq: Every day | ORAL | Status: DC
  Administered 2020-06-13: 1 via ORAL
  Filled 2020-06-13: qty 1

## 2020-06-13 MED ORDER — LABETALOL HCL 200 MG PO TABS
200.0000 mg | ORAL_TABLET | Freq: Three times a day (TID) | ORAL | Status: DC
  Administered 2020-06-13 – 2020-06-14 (×3): 200 mg via ORAL
  Filled 2020-06-13 (×3): qty 1

## 2020-06-13 MED ORDER — NIFEDIPINE ER OSMOTIC RELEASE 30 MG PO TB24
60.0000 mg | ORAL_TABLET | Freq: Every day | ORAL | Status: DC
  Administered 2020-06-13 – 2020-06-14 (×2): 60 mg via ORAL
  Filled 2020-06-13 (×2): qty 2

## 2020-06-13 NOTE — Progress Notes (Signed)
Patient ID: Donna Burns, female   DOB: August 05, 1980, 40 y.o.   MRN: [id number] Chart check - normal LFTs and urine pr/cr noted

## 2020-06-13 NOTE — H&P (Addendum)
Donna Burns is an 40 y.o. female,G1 at 69 3/[redacted]wks gestation here for admission. Pt had a non reactive NST in office today followed by a BPP which scored 6/8 ( 6/10). She was thus sent to the hospital for admission with extended monitoring  Pt is dated per a 7 week [place]. Her pregnancy is complicated by her age, obesity, kidney disease ( stage 3 per [facility]), and chronic hypertension. She is on nifedipine XL 60 mg daily and labetalol 400 mg bid. She has been receiving weekly testing, monthly growth scans with MFM and plan set for delivery at 37 weeks She was recently tested for covid : tested pos 12/29, sx started 12/28. Pt is fully vaccinated and boosted  Pertinent Gynecological History: Bleeding: none Sexually transmitted diseases: no past history Previous GYN Procedures: none   Menstrual History: Menarche age: 33 Patient's last menstrual period was 10/06/2019 (exact date).    Past Medical History:  Diagnosis Date  . Bilateral polycystic ovarian syndrome   . Chronic kidney disease   . HSV-2 (herpes simplex virus 2) infection   . Hypertension 2008  . Prediabetes   . Vaginal Pap smear, abnormal     Past Surgical History:  Procedure Laterality Date  . KNEE SURGERY Right 2015   torn cartilage--arthroscopic  . MOUTH SURGERY  2016   9 teeth pulled for decay-upper incisors.    Family History  Problem Relation Age of Onset  . Heart failure Mother   . Diabetes Mother   . Hypertension Mother   . Breast cancer Mother   . Stroke Father        Nursing home bound  . Hypertension Father   . Dementia Father        from strokes  . Other Sister        Prediabetes  . Hypertension Sister     Social History:  reports that she has never smoked. She has never used smokeless tobacco. She reports that she does not drink alcohol and does not use drugs.  Allergies:  Allergies  Allergen Reactions  . No Known Allergies   . Pork-Derived Products Other (See Comments)    Pt does not  eat pork or use pork-derived products    Medications Prior to Admission  Medication Sig Dispense Refill Last Dose  . aspirin EC 81 MG tablet Take 81 mg by mouth daily. Swallow whole.     . labetalol (NORMODYNE) 200 MG tablet Take 2 tablets (400 mg total) by mouth 3 (three) times daily. TAKE 2 TABLETS BY MOUTH TWICE DAILY. 180 tablet 1   . NIFEdipine (ADALAT CC) 60 MG 24 hr tablet Take 60 mg by mouth daily.     . Prenatal Vit w/Fe-Methylfol-FA (PNV PO) Take by mouth.       Review of Systems  Constitutional: Negative for activity change, appetite change, diaphoresis and fatigue.  Eyes: Negative for photophobia and visual disturbance.  Respiratory: Negative for chest tightness and shortness of breath.   Cardiovascular: Negative for chest pain, palpitations and leg swelling.  Gastrointestinal: Negative for abdominal pain.  Genitourinary: Negative for pelvic pain, vaginal bleeding and vaginal discharge.  Neurological: Negative for light-headedness and headaches.  Psychiatric/Behavioral: The patient is nervous/anxious.     Blood pressure (!) 146/96, pulse 84, temperature 98.8 F (37.1 C), temperature source Axillary, resp. rate 18, height 5\' 7"  (1.702 m), weight 131.5 kg, last menstrual period 10/06/2019, SpO2 100 %. Physical Exam Vitals and nursing note reviewed. Exam conducted with a chaperone present.  Pulmonary:     Effort: Pulmonary effort is normal.  Musculoskeletal:        General: Normal range of motion.     Cervical back: Normal range of motion.  Skin:    General: Skin is warm.     Capillary Refill: Capillary refill takes 2 to 3 seconds.  Neurological:     General: No focal deficit present.     Mental Status: She is alert and oriented to person, place, and time. Mental status is at baseline.  Psychiatric:        Mood and Affect: Mood normal.        Behavior: Behavior normal.        Thought Content: Thought content normal.        Judgment: Judgment normal.     Results  for orders placed or performed during the hospital encounter of 06/13/20 (from the past 24 hour(s))  Type and screen [facility]     Status: None   Collection Time: 06/13/20  3:02 PM  Result Value Ref Range   ABO/RH(D) A POS    Antibody Screen NEG    Sample Expiration      06/16/2020,2359 Performed at [facility], [address], [place], [place] [address]   Hepatic function panel     Status: Abnormal   Collection Time: 06/13/20  3:02 PM  Result Value Ref Range   Total Protein 6.6 6.5 - 8.1 g/dL   Albumin 2.5 (L) 3.5 - 5.0 g/dL   AST 19 15 - 41 U/L   ALT 19 0 - 44 U/L   Alkaline Phosphatase 40 38 - 126 U/L   Total Bilirubin 0.3 0.3 - 1.2 mg/dL   Bilirubin, Direct <9.2 0.0 - 0.2 mg/dL   Indirect Bilirubin NOT CALCULATED 0.3 - 0.9 mg/dL    No results found.  Assessment/Plan: 40yo G1 at 33 2/7wks with concerning fetal assessment here for extended monitoring and assessment for need for delivery -Continuous fetal monitoring - Recheck BPP tomorrow morning - BMZ given at 1517pm - repeat in 24hrs - Discussed parameters that would indicate need for imminent delivery: persistent concerning fetal tracing.  - If reassuring tracing/US post second dose of BMZ, likely discharge to home  - Obtain MFM and NICU consults - Monitor BP for control  Janean Sark Elye Harmsen 06/13/2020, 7:00 PM

## 2020-06-14 ENCOUNTER — Inpatient Hospital Stay (HOSPITAL_BASED_OUTPATIENT_CLINIC_OR_DEPARTMENT_OTHER): Payer: Medicaid Other

## 2020-06-14 DIAGNOSIS — O10013 Pre-existing essential hypertension complicating pregnancy, third trimester: Secondary | ICD-10-CM | POA: Diagnosis not present

## 2020-06-14 DIAGNOSIS — B009 Herpesviral infection, unspecified: Secondary | ICD-10-CM

## 2020-06-14 DIAGNOSIS — O10913 Unspecified pre-existing hypertension complicating pregnancy, third trimester: Secondary | ICD-10-CM | POA: Diagnosis not present

## 2020-06-14 DIAGNOSIS — O98513 Other viral diseases complicating pregnancy, third trimester: Secondary | ICD-10-CM | POA: Diagnosis not present

## 2020-06-14 DIAGNOSIS — O09513 Supervision of elderly primigravida, third trimester: Secondary | ICD-10-CM | POA: Diagnosis not present

## 2020-06-14 DIAGNOSIS — E669 Obesity, unspecified: Secondary | ICD-10-CM | POA: Diagnosis not present

## 2020-06-14 DIAGNOSIS — O09523 Supervision of elderly multigravida, third trimester: Secondary | ICD-10-CM | POA: Diagnosis not present

## 2020-06-14 DIAGNOSIS — Z3A32 32 weeks gestation of pregnancy: Secondary | ICD-10-CM

## 2020-06-14 DIAGNOSIS — O368390 Maternal care for abnormalities of the fetal heart rate or rhythm, unspecified trimester, not applicable or unspecified: Secondary | ICD-10-CM

## 2020-06-14 DIAGNOSIS — O99213 Obesity complicating pregnancy, third trimester: Secondary | ICD-10-CM | POA: Diagnosis not present

## 2020-06-14 LAB — CBC
HCT: 37.2 % (ref 36.0–46.0)
Hemoglobin: 12.3 g/dL (ref 12.0–15.0)
MCH: 30.8 pg (ref 26.0–34.0)
MCHC: 33.1 g/dL (ref 30.0–36.0)
MCV: 93.2 fL (ref 80.0–100.0)
Platelets: 169 10*3/uL (ref 150–400)
RBC: 3.99 MIL/uL (ref 3.87–5.11)
RDW: 13.8 % (ref 11.5–15.5)
WBC: 16 10*3/uL — ABNORMAL HIGH (ref 4.0–10.5)
nRBC: 0 % (ref 0.0–0.2)

## 2020-06-14 MED ORDER — LABETALOL HCL 200 MG PO TABS: 200.0000 mg | ORAL_TABLET | Freq: Two times a day (BID) | ORAL | Status: DC

## 2020-06-14 MED ORDER — LABETALOL HCL 200 MG PO TABS: 400.0000 mg | ORAL_TABLET | Freq: Two times a day (BID) | ORAL | 1 refills | Status: DC

## 2020-06-14 MED ORDER — LABETALOL HCL 200 MG PO TABS
400.0000 mg | ORAL_TABLET | Freq: Two times a day (BID) | ORAL | Status: DC
Start: 2020-06-14 — End: 2020-06-14

## 2020-06-14 NOTE — Consult Note (Signed)
MFM Note  Donna Burns is a 40 year old gravida 1 para 0 currently at 32 weeks and 3 days.  She was admitted last evening after she had a nonreactive NST and a biophysical profile that was 6 out of 8 in the office.  She was then sent to the hospital for extended monitoring.  Her pregnancy has been complicated by advanced maternal age, obesity, chronic kidney disease, and chronic hypertension that is currently treated with nifedipine and labetalol.  She had a growth ultrasound performed in our office about 5 days ago showing an EFW of 3 pounds 13 ounces (25th percentile for her gestational age).  There was normal amniotic fluid with a total AFI of 11.1 cm noted.  Since being admitted to the hospital overnight, her fetal heart rate tracing has been reactive overall.  Occasional mild variable decelerations were noted.  There has been good variability noted on her fetal heart rate tracing. The patient reports that she has been feeling fetal movements throughout the night.  Her PIH labs are all within normal limits except for the elevated serum creatinine level, which has remained stable as compared to her prior serum creatinine levels.  Her P/C was 0.12.  A biophysical profile performed today was 8 out of 8.    Low normal amniotic fluid with a total AFI of 7.7 cm was noted today.  There was a 3.7 cm maximal vertical pocket noted.  The patient will be placed back on continuous monitoring this morning.  She may be discharged home later this afternoon should her fetal heart rate tracing continue to be reactive.  She should receive the second dose of antenatal corticosteroids prior to discharge.    She should continue twice-weekly fetal testing in your office.  She already has another nonstress test scheduled in 2 days. She should have another fluid check performed when she presents for her next nonstress test.   Recommendations:  Discharge home later this afternoon should her NST continue to be  reactive She should receive the second dose of betamethasone prior to discharge Continue twice weekly fetal testing in your office Repeat fluid check in 2 days when she presents for her next NST

## 2020-06-14 NOTE — Progress Notes (Signed)
HD #2, [redacted]W[redacted]D, CHTN Feels fine Afeb, VSS, BP 130-150/80-90 FHT-reactive overnight, had one possible 2-3 minute decel  Will get BPP with MFM today and follow their recommendations Due for 2nd BMZ this pm There was confusion about her labetalol dose-the urgent care physician who diagnosed her with COVID had told her to increase to TID, will reduce back to 400 mg bid, continue Procardia XL 60 mg daily, see if needs higher dose of either

## 2020-06-14 NOTE — Progress Notes (Signed)
I have spoken with Dr. Parke Poisson NST remains reactive Will d/c home this pm after gets 2nd BMZ

## 2020-06-15 DIAGNOSIS — N183 Chronic kidney disease, stage 3 unspecified: Secondary | ICD-10-CM | POA: Diagnosis not present

## 2020-06-16 DIAGNOSIS — O4100X0 Oligohydramnios, unspecified trimester, not applicable or unspecified: Secondary | ICD-10-CM | POA: Diagnosis not present

## 2020-06-16 DIAGNOSIS — O99213 Obesity complicating pregnancy, third trimester: Secondary | ICD-10-CM | POA: Diagnosis not present

## 2020-06-16 DIAGNOSIS — Z3A32 32 weeks gestation of pregnancy: Secondary | ICD-10-CM | POA: Diagnosis not present

## 2020-06-16 DIAGNOSIS — O133 Gestational [pregnancy-induced] hypertension without significant proteinuria, third trimester: Secondary | ICD-10-CM | POA: Diagnosis not present

## 2020-06-16 DIAGNOSIS — O164 Unspecified maternal hypertension, complicating childbirth: Secondary | ICD-10-CM | POA: Diagnosis not present

## 2020-06-16 NOTE — Discharge Summary (Signed)
Physician Discharge Summary  Patient ID: Donna Burns MRN: [id number] DOB/AGE: October 28, 1980 40 y.o.  Admit date: 06/13/2020 Discharge date: 06/14/2020  Admission Diagnoses:  IUP at 32 weeks, Non-reactive NST  Discharge Diagnoses: Same Active Problems:   Non-reassuring electronic fetal monitoring tracing   Discharged Condition: good  Hospital Course: Pt admitted by Dr. Mindi Slicker for non-reactive NST, 6/8 BPP.  Overnight, NST reactive.  Repeat BPP 8/8, AFI a bit lower than previously.  MFM felt stable for discharge with close f/u.  She received her course of betamethasone and was d/c home  Consults: MFM  Discharge Exam: Blood pressure (!) 153/86, pulse 84, temperature 98.3 F (36.8 C), temperature source Oral, resp. rate 18, height 5\' 7"  (1.702 m), weight 131.5 kg, last menstrual period 10/06/2019, SpO2 97 %. General appearance: alert  Disposition: Discharge disposition: 01-Home or Self Care       Discharge Instructions    Discharge activity:  No Restrictions   Complete by: As directed    Discharge diet:  No restrictions   Complete by: As directed    No sexual activity restrictions   Complete by: As directed      Allergies as of 06/14/2020      Reactions   No Known Allergies    Pork-derived Products Other (See Comments)   Pt does not eat pork or use pork-derived products      Medication List    TAKE these medications   aspirin EC 81 MG tablet Take 81 mg by mouth daily. Swallow whole.   labetalol 200 MG tablet Commonly known as: NORMODYNE Take 2 tablets (400 mg total) by mouth 2 (two) times daily. TAKE 2 TABLETS BY MOUTH TWICE DAILY. What changed: when to take this   NIFEdipine 60 MG 24 hr tablet Commonly known as: ADALAT CC Take 60 mg by mouth daily.   PNV PO Take by mouth.        Signed: 06/16/2020 [name] 06/16/2020, 9:13 AM

## 2020-06-20 DIAGNOSIS — O163 Unspecified maternal hypertension, third trimester: Secondary | ICD-10-CM | POA: Diagnosis not present

## 2020-06-20 DIAGNOSIS — Z3A33 33 weeks gestation of pregnancy: Secondary | ICD-10-CM | POA: Diagnosis not present

## 2020-06-21 ENCOUNTER — Inpatient Hospital Stay (HOSPITAL_COMMUNITY)
Admission: AD | Admit: 2020-06-21 | Discharge: 2020-06-21 | Disposition: A | Discharge status: Home / Self Care | Payer: Medicaid Other | Attending: Obstetrics and Gynecology | Admitting: Obstetrics and Gynecology

## 2020-06-21 ENCOUNTER — Inpatient Hospital Stay (HOSPITAL_BASED_OUTPATIENT_CLINIC_OR_DEPARTMENT_OTHER): Payer: Medicaid Other

## 2020-06-21 ENCOUNTER — Encounter (HOSPITAL_COMMUNITY): Payer: Self-pay | Admitting: Obstetrics and Gynecology

## 2020-06-21 ENCOUNTER — Other Ambulatory Visit: Payer: Self-pay

## 2020-06-21 DIAGNOSIS — O09523 Supervision of elderly multigravida, third trimester: Secondary | ICD-10-CM | POA: Insufficient documentation

## 2020-06-21 DIAGNOSIS — I129 Hypertensive chronic kidney disease with stage 1 through stage 4 chronic kidney disease, or unspecified chronic kidney disease: Secondary | ICD-10-CM | POA: Diagnosis not present

## 2020-06-21 DIAGNOSIS — N181 Chronic kidney disease, stage 1: Secondary | ICD-10-CM | POA: Diagnosis not present

## 2020-06-21 DIAGNOSIS — O99891 Other specified diseases and conditions complicating pregnancy: Secondary | ICD-10-CM

## 2020-06-21 DIAGNOSIS — O10013 Pre-existing essential hypertension complicating pregnancy, third trimester: Secondary | ICD-10-CM | POA: Diagnosis not present

## 2020-06-21 DIAGNOSIS — O4103X0 Oligohydramnios, third trimester, not applicable or unspecified: Secondary | ICD-10-CM | POA: Insufficient documentation

## 2020-06-21 DIAGNOSIS — Z3A33 33 weeks gestation of pregnancy: Secondary | ICD-10-CM | POA: Diagnosis not present

## 2020-06-21 DIAGNOSIS — O99213 Obesity complicating pregnancy, third trimester: Secondary | ICD-10-CM | POA: Insufficient documentation

## 2020-06-21 DIAGNOSIS — E669 Obesity, unspecified: Secondary | ICD-10-CM

## 2020-06-21 DIAGNOSIS — E282 Polycystic ovarian syndrome: Secondary | ICD-10-CM | POA: Insufficient documentation

## 2020-06-21 DIAGNOSIS — O10213 Pre-existing hypertensive chronic kidney disease complicating pregnancy, third trimester: Secondary | ICD-10-CM | POA: Diagnosis not present

## 2020-06-21 DIAGNOSIS — O42913 Preterm premature rupture of membranes, unspecified as to length of time between rupture and onset of labor, third trimester: Secondary | ICD-10-CM | POA: Diagnosis not present

## 2020-06-21 DIAGNOSIS — O99283 Endocrine, nutritional and metabolic diseases complicating pregnancy, third trimester: Secondary | ICD-10-CM | POA: Diagnosis not present

## 2020-06-21 LAB — URINALYSIS, ROUTINE W REFLEX MICROSCOPIC
Bilirubin Urine: NEGATIVE
Glucose, UA: NEGATIVE mg/dL
Hgb urine dipstick: NEGATIVE
Ketones, ur: NEGATIVE mg/dL
Leukocytes,Ua: NEGATIVE
Nitrite: NEGATIVE
Protein, ur: NEGATIVE mg/dL
Specific Gravity, Urine: 1.017 (ref 1.005–1.030)
pH: 5 (ref 5.0–8.0)

## 2020-06-21 LAB — AMNISURE RUPTURE OF MEMBRANE (ROM) NOT AT ARMC: Amnisure ROM: NEGATIVE

## 2020-06-21 IMAGING — US US MFM OB LIMITED
1 series · 13 of 13 positions shown · non-contrast
Comparison: none

[Series 1: us mfm ob limited · 13 of 13 slices shown]
[im 1/13]
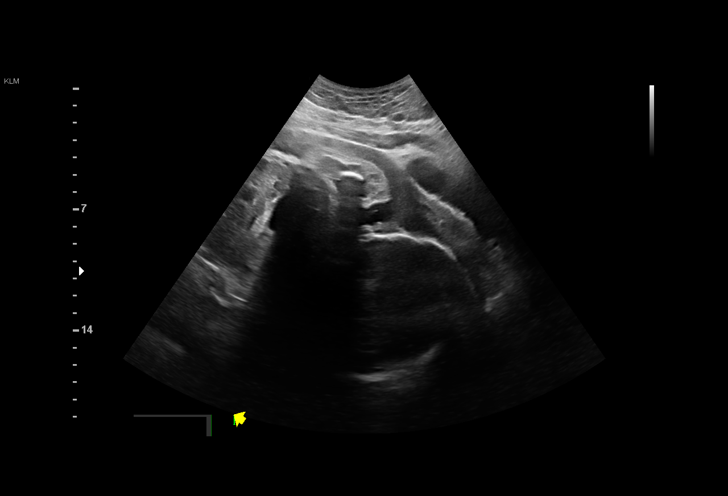
[im 2/13]
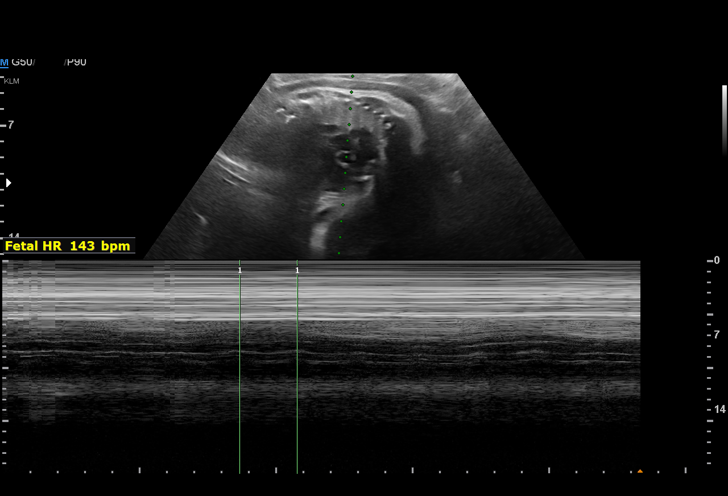
[im 3/13]
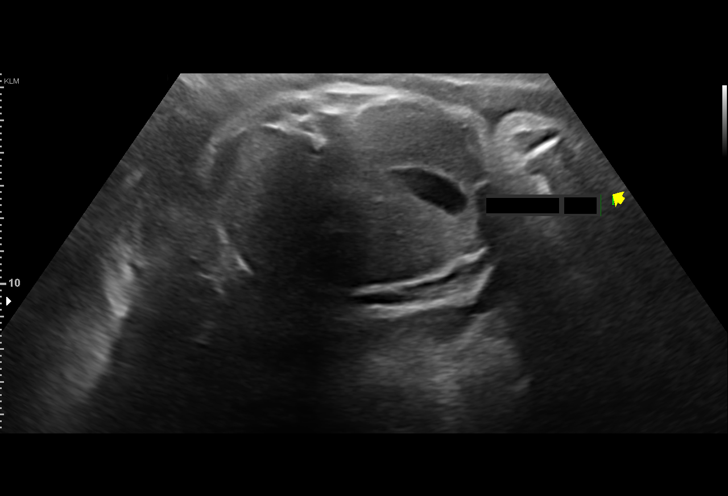
[im 4/13]
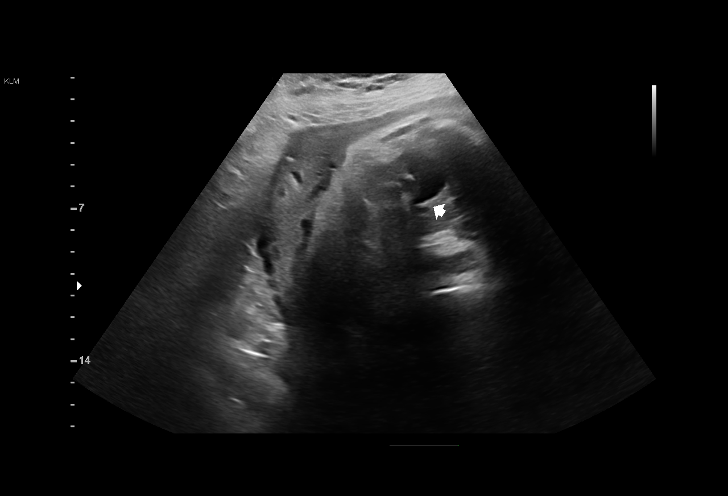
[im 5/13]
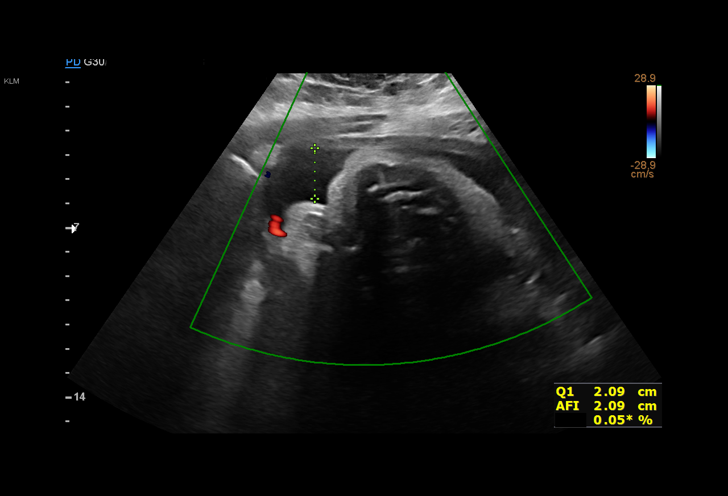
[im 6/13]
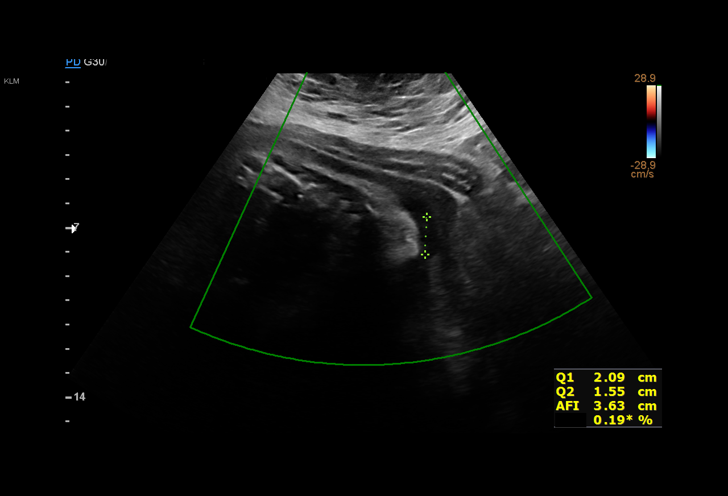
[im 7/13]
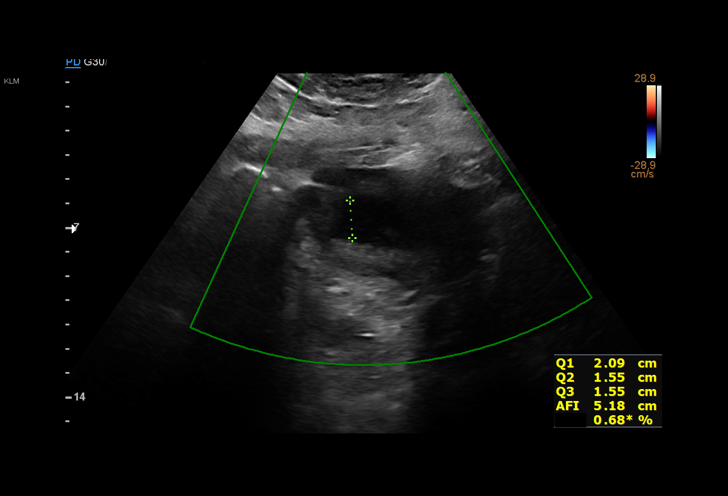
[im 8/13]
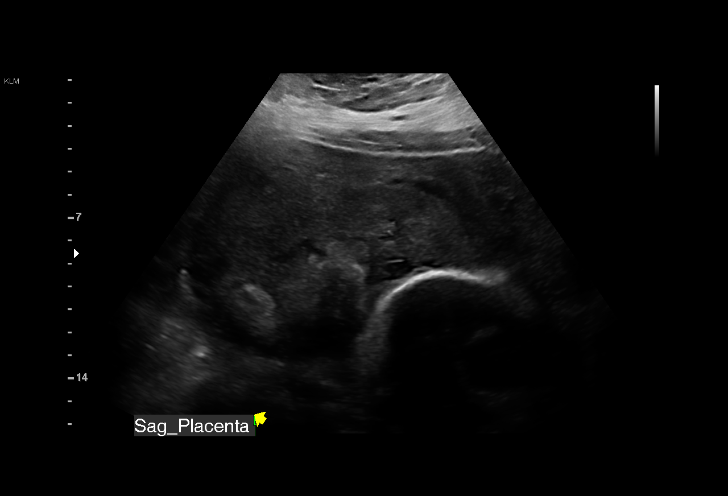
[im 9/13]
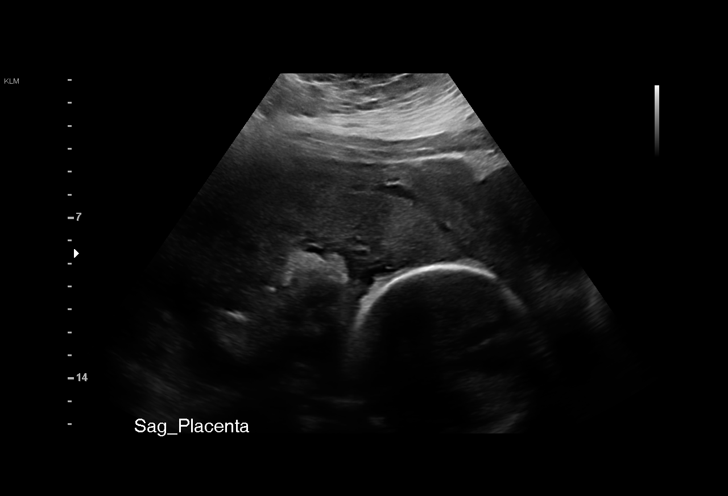
[im 10/13]
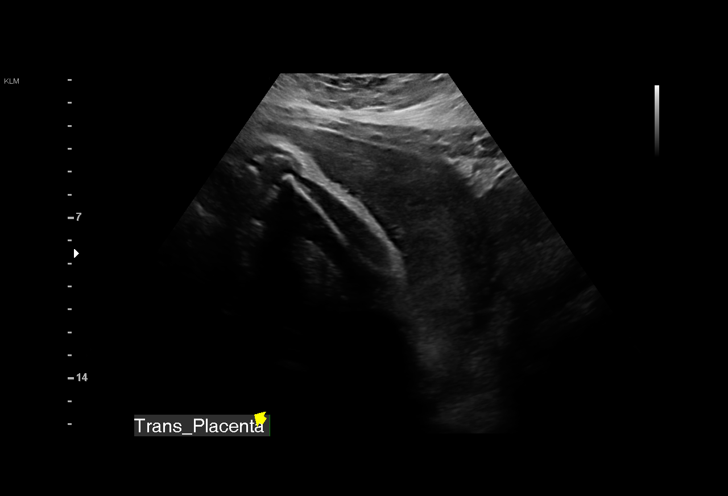
[im 11/13]
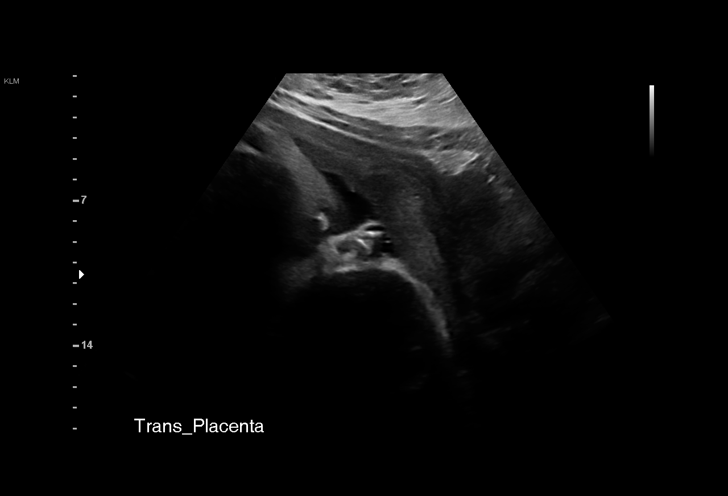
[im 12/13]
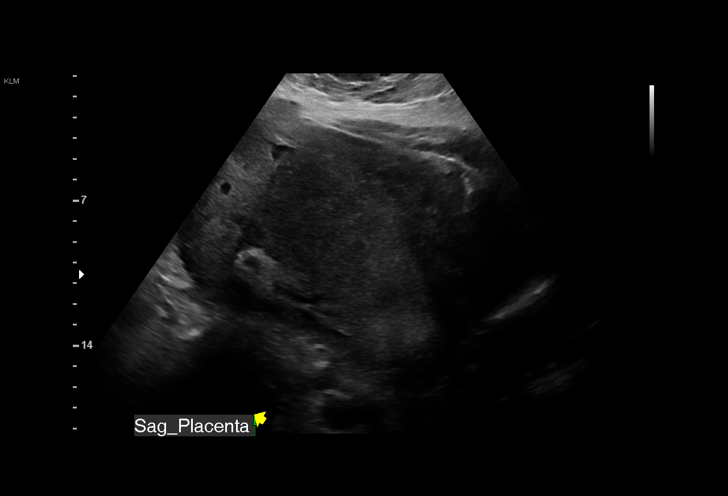
[im 13/13]
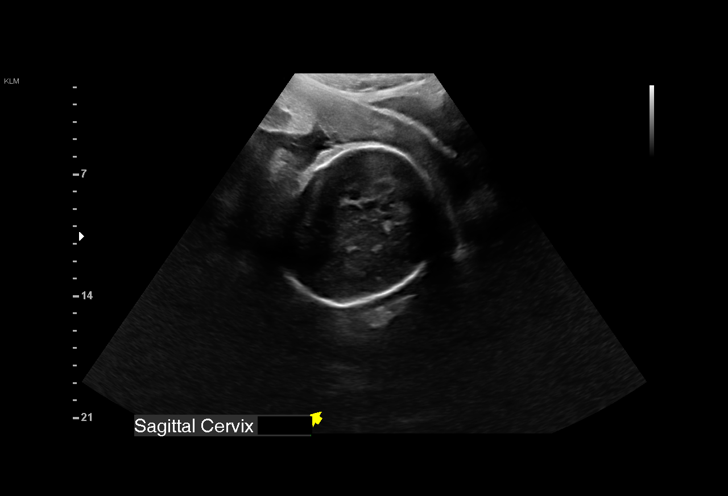

[13 of 13 positions shown; findings below may reference images not displayed]

Indications

 Leakage of amniotic fluid                      [FI]
 33 weeks gestation of pregnancy
Fetal Evaluation

 Num Of Fetuses:         1
 Fetal Heart Rate(bpm):  143
 Cardiac Activity:       Observed
 Presentation:           Cephalic
 Placenta:               Left lateral
 P. Cord Insertion:      Previously Visualized

 Amniotic Fluid
 AFI FV:      Subjectively decreased

 AFI Sum(cm)     %Tile       Largest Pocket(cm)
 5.3             < 3

 RUQ(cm)       RLQ(cm)       LUQ(cm)

Gestational Age

 Best:          33w 3d     Det. By:  U/S C R L  ([DATE])    EDD:   [DATE]
Anatomy

 Stomach:               Appears normal, left   Bladder:                Appears normal
                        sided
Cervix Uterus Adnexa

 Cervix
 Not visualized (advanced GA >[FI])
Impression

 Patient is being evaluated in the [facility] for complaints of
 leakage of amniotic fluid.  She has chronic hypertension that
 is well controlled on labetalol.
 Amniotic fluid is decreased for this gestational age.  Deepest
 vertical pocket measurement is 2 cm (no oligohydramnios).

                 [facility]

## 2020-06-21 IMAGING — US US MFM FETAL BPP W/O NON-STRESS
1 series · 13 of 25 positions shown · non-contrast
Comparison: none

[Series 1: us mfm fetal bpp w/o non-stress · 25 acquisitions, 13 frames shown]
[im 1/25]
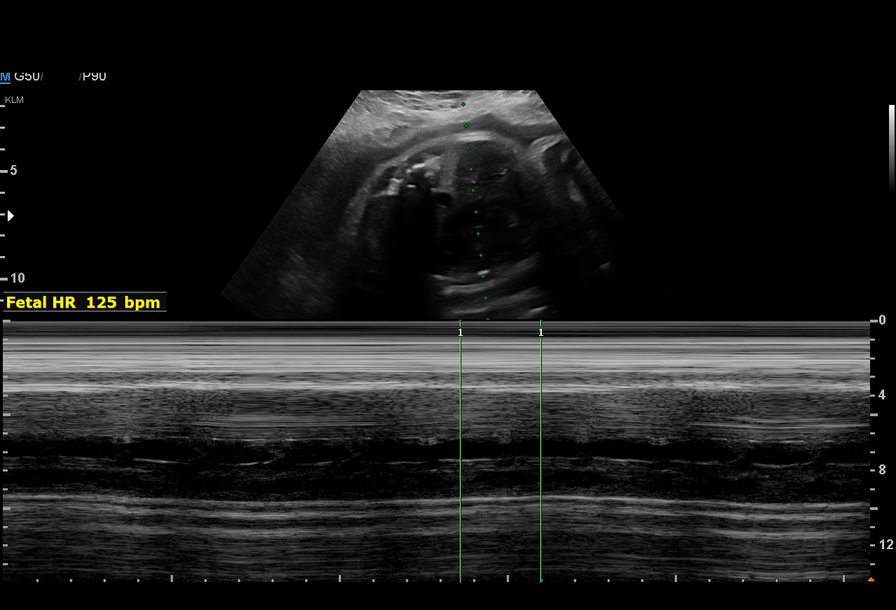
[im 3/25]
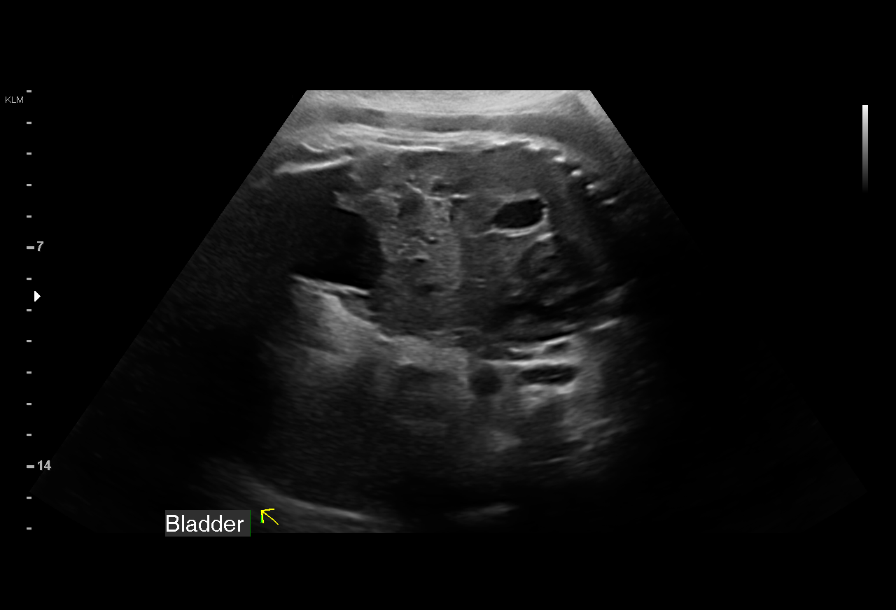
[im 5/25]
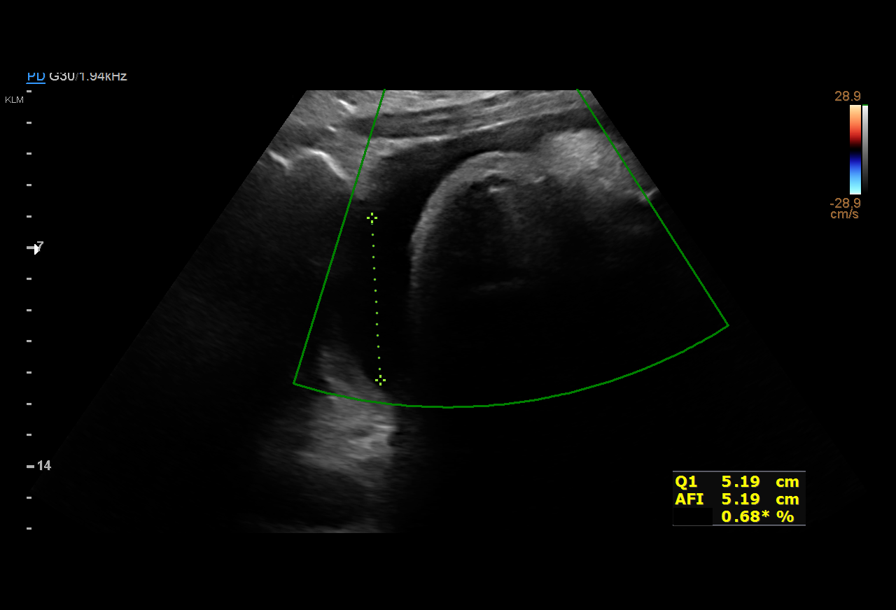
[im 7/25]
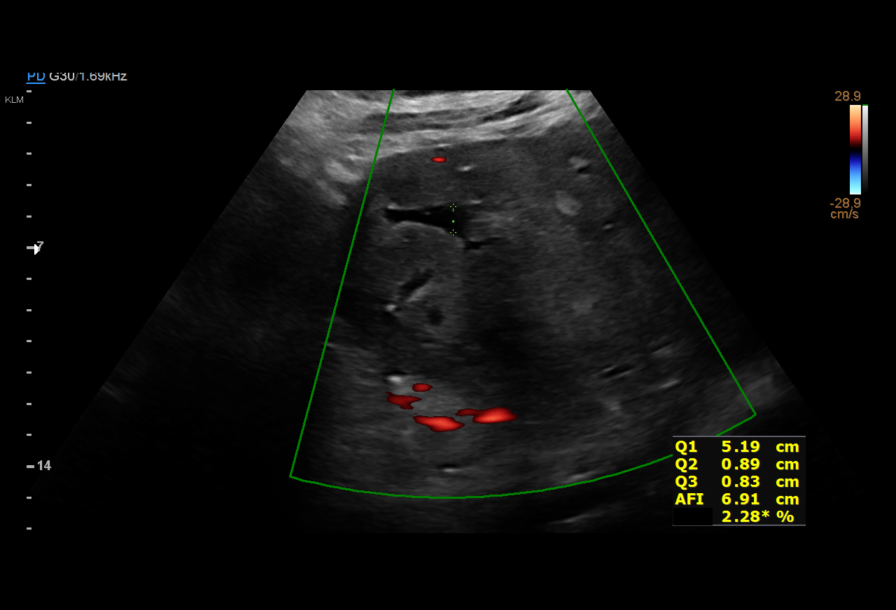
[im 9/25]
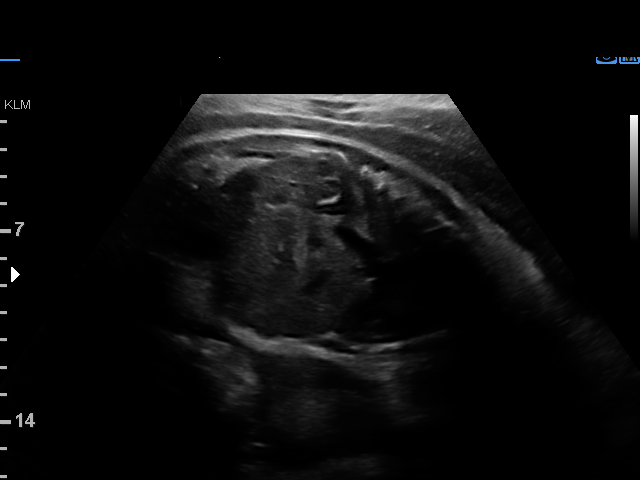
[im 11/25]
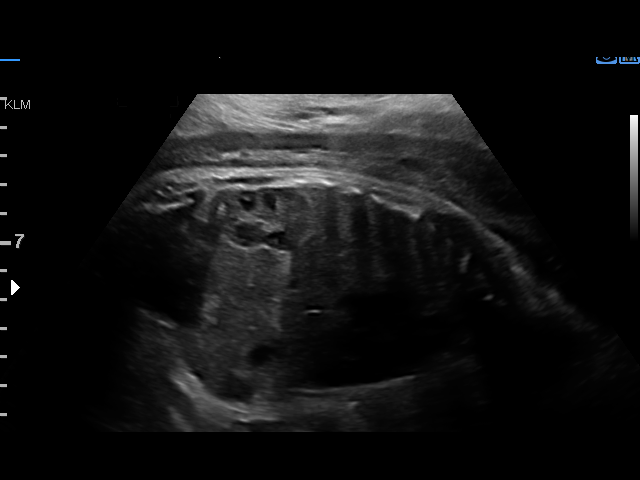
[im 13/25]
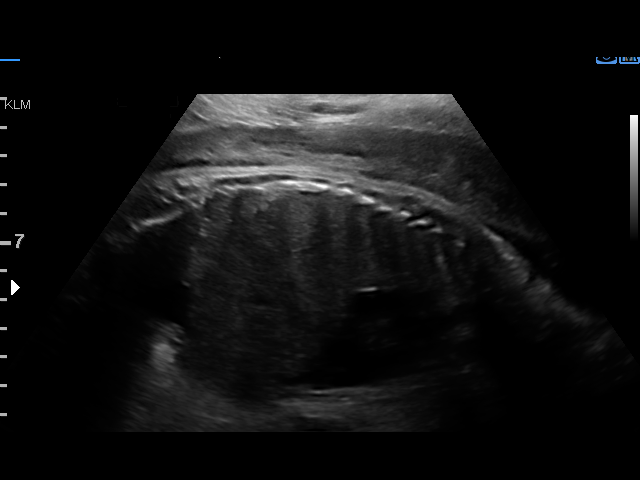
[im 15/25]
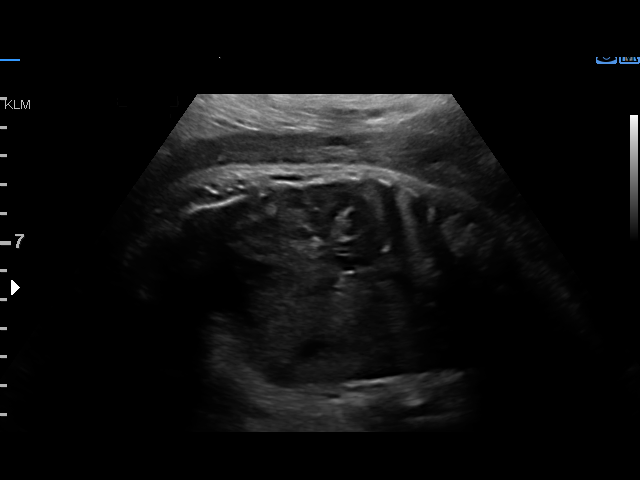
[im 17/25]
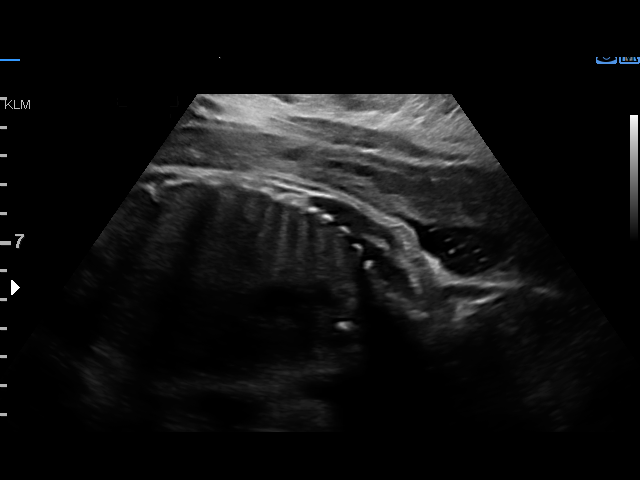
[im 19/25]
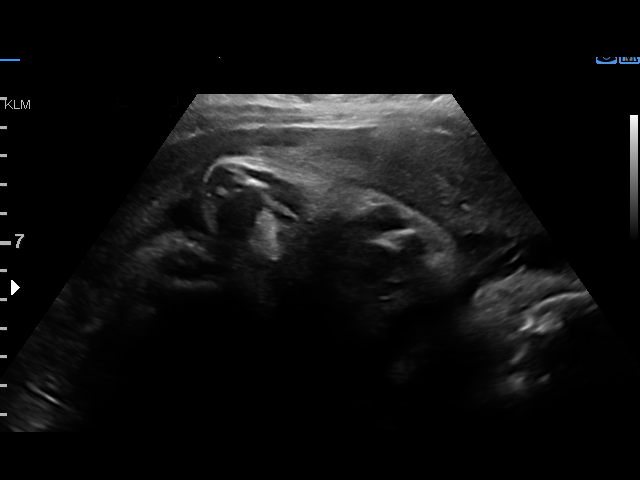
[im 21/25]
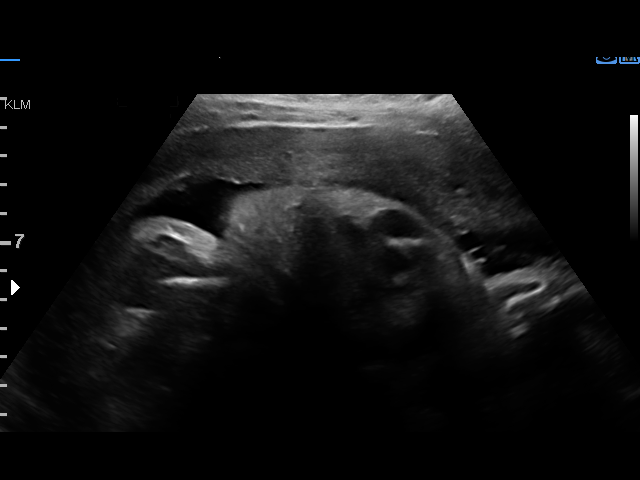
[im 23/25]
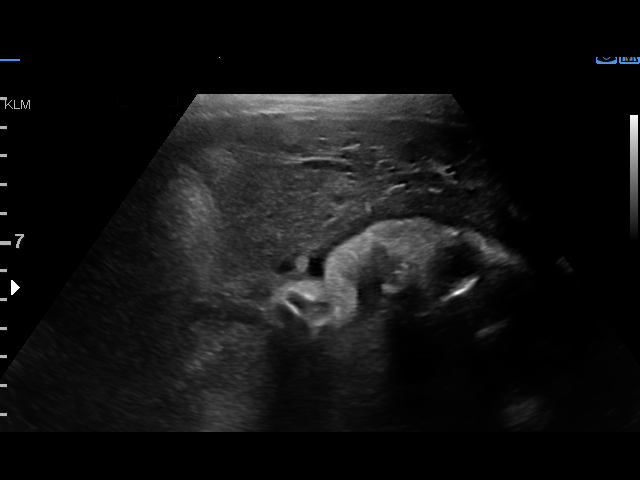
[im 25/25]
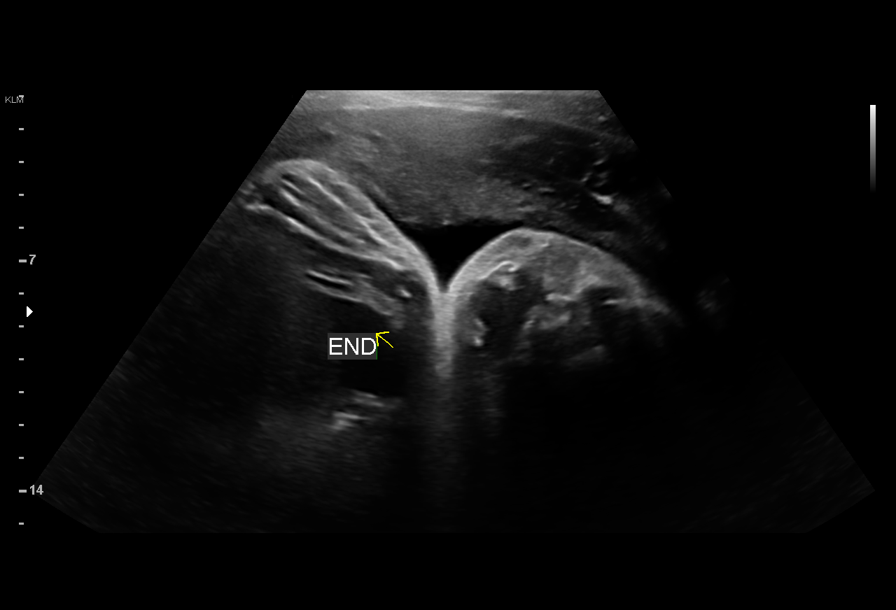

[13 of 25 positions shown; findings below may reference images not displayed]

Indications

 Hypertension - Chronic/Pre-                    [57]
 existing(Labetalol)
 33 weeks gestation of pregnancy
 Advanced maternal age multigravida 35+,        [57]
 third trimester
 Obesity complicating pregnancy, third          [57]
 trimester
Fetal Evaluation

 Num Of Fetuses:         1
 Fetal Heart Rate(bpm):  125
 Cardiac Activity:       Observed
 Presentation:           Cephalic

 Amniotic Fluid
 AFI FV:      Within normal limits

 AFI Sum(cm)     %Tile       Largest Pocket(cm)
 6.91            < 3

 RUQ(cm)                     LUQ(cm)        LLQ(cm)

Biophysical Evaluation

 Amniotic F.V:   Within normal limits       F. Tone:        Observed
 F. Movement:    Observed                   Score:          [DATE]
 F. Breathing:   Observed
Gestational Age
 Best:          33w 3d     Det. By:  U/S C R L  ([DATE])    EDD:   [DATE]
Anatomy

 Stomach:               Appears normal, left   Bladder:                Appears normal
                        sided
Impression

 Patient return for BPP.

 Antenatal testing is reassuring. BPP [DATE].  Cephalic
 presentation.  Amniotic fluid levels were better compared with
 previous scan with deepest vertical pocket measurement at 5
 cm.

 I discussed with Dr.ANUEL A.

 recommended they assess amniotic fluid at least once
 weekly.
                 ANUEL A

## 2020-06-21 NOTE — MAU Note (Signed)
Presents with c/o LOF, states had a gush of clear fluid @ 0730 this morning. Denies recent intercourse.  Reports has had low fluid during the pregnancy.  Denies VB.  Endorses +FM.

## 2020-06-21 NOTE — MAU Provider Note (Signed)
Chief Complaint:  Rupture of Membranes   Event Date/Time   First Provider Initiated Contact with Patient 06/21/20 0957      HPI: Donna Burns is a 40 y.o. G1P0000 at [redacted]w[redacted]d with CHTN and CKD with recent diagnosis of mild oligohydramnios who presents to maternity admissions reporting some leaking fluid. She was told to watch for leaking fluid since her AFI dropped from 11 to 7.4 on recent ultrasounds.  She reports frequent urination and feeling like her underwear is wet in between today. There has been no large gush and the leaking has not required a pad. She denies any regular cramping or other pain.  There are no other associated symptoms She has not tried any treatments.  Of note she was diagnosed with COVID on 05/24/21 and had mild symptoms which have now resolved. She has received both COVID vaccines and a booster injection.  She is taking her HTN medications as prescribed. She reports good fetal movement, denies vaginal bleeding, vaginal itching/burning, urinary symptoms, h/a, dizziness, n/v, or fever/chills.     HPI  Past Medical History: Past Medical History:  Diagnosis Date  . Bilateral polycystic ovarian syndrome   . Chronic kidney disease   . HSV-2 (herpes simplex virus 2) infection   . Hypertension 2008  . Prediabetes   . Vaginal Pap smear, abnormal     Past obstetric history: OB History  Gravida Para Term Preterm AB Living  1 0 0 0 0 0  SAB IAB Ectopic Multiple Live Births  0 0 0 0 0    # Outcome Date GA Lbr Len/2nd Weight Sex Delivery Anes PTL Lv  1 Current             Past Surgical History: Past Surgical History:  Procedure Laterality Date  . KNEE SURGERY Right 2015   torn cartilage--arthroscopic  . MOUTH SURGERY  2016   9 teeth pulled for decay-upper incisors.    Family History: Family History  Problem Relation Age of Onset  . Heart failure Mother   . Diabetes Mother   . Hypertension Mother   . Breast cancer Mother   . Stroke Father        Nursing  home bound  . Hypertension Father   . Dementia Father        from strokes  . Other Sister        Prediabetes  . Hypertension Sister     Social History: Social History   Tobacco Use  . Smoking status: Never Smoker  . Smokeless tobacco: Never Used  Vaping Use  . Vaping Use: Never used  Substance Use Topics  . Alcohol use: Never  . Drug use: Never    Allergies:  Allergies  Allergen Reactions  . No Known Allergies   . Pork-Derived Products Other (See Comments)    Pt does not eat pork or use pork-derived products    Meds:  Medications Prior to Admission  Medication Sig Dispense Refill Last Dose  . aspirin EC 81 MG tablet Take 81 mg by mouth daily. Swallow whole.   06/20/2020 at 1900  . labetalol (NORMODYNE) 200 MG tablet Take 2 tablets (400 mg total) by mouth 2 (two) times daily. TAKE 2 TABLETS BY MOUTH TWICE DAILY. 180 tablet 1 06/20/2020 at 1030  . NIFEdipine (ADALAT CC) 60 MG 24 hr tablet Take 60 mg by mouth daily.   06/20/2020 at 1730  . Prenatal Vit w/Fe-Methylfol-FA (PNV PO) Take by mouth.   06/20/2020 at 1900  ROS:  Review of Systems  Constitutional: Negative for chills, fatigue and fever.  Eyes: Negative for visual disturbance.  Respiratory: Negative for shortness of breath.   Cardiovascular: Negative for chest pain.  Gastrointestinal: Negative for abdominal pain, nausea and vomiting.  Genitourinary: Positive for vaginal discharge. Negative for difficulty urinating, dysuria, flank pain, pelvic pain, vaginal bleeding and vaginal pain.  Neurological: Negative for dizziness and headaches.  Psychiatric/Behavioral: Negative.      I have reviewed patient's Past Medical Hx, Surgical Hx, Family Hx, Social Hx, medications and allergies.   Physical Exam   Patient Vitals for the past 24 hrs:  BP Temp Temp src Pulse Resp SpO2  06/21/20 1319 (!) 143/82 - - 83 15 100 %  06/21/20 1018 (!) 141/96 - - 85 - -  06/21/20 1016 (!) 154/101 - - 97 - -  06/21/20 1001 134/85 - -  90 - -  06/21/20 0946 (!) 135/93 - - 88 - -  06/21/20 0931 (!) 129/94 - - 94 - -  06/21/20 0916 132/89 - - 91 - -  06/21/20 0906 124/86 - - (!) 101 - -  06/21/20 0848 (!) 133/94 98.2 F (36.8 C) Oral (!) 102 18 100 %   Constitutional: Well-developed, well-nourished female in no acute distress.  Cardiovascular: normal rate Respiratory: normal effort GI: Abd soft, non-tender, gravid appropriate for gestational age.  MS: Extremities nontender, no edema, normal ROM Neurologic: Alert and oriented x 4.  GU: Neg CVAT.  PELVIC EXAM: Cervix pink, visually closed and long, without lesion, scant white creamy discharge, no pooling of fluid with Valsalva, vaginal walls and external genitalia normal     FHT:  Baseline 135 , moderate variability, accelerations present, no decelerations Contractions: None on toco or to palpation   Labs: Results for orders placed or performed during the hospital encounter of 06/21/20 (from the past 24 hour(s))  Urinalysis, Routine w reflex microscopic Urine, Clean Catch     Status: None   Collection Time: 06/21/20  8:53 AM  Result Value Ref Range   Color, Urine YELLOW YELLOW   APPearance CLEAR CLEAR   Specific Gravity, Urine 1.017 1.005 - 1.030   pH 5.0 5.0 - 8.0   Glucose, UA NEGATIVE NEGATIVE mg/dL   Hgb urine dipstick NEGATIVE NEGATIVE   Bilirubin Urine NEGATIVE NEGATIVE   Ketones, ur NEGATIVE NEGATIVE mg/dL   Protein, ur NEGATIVE NEGATIVE mg/dL   Nitrite NEGATIVE NEGATIVE   Leukocytes,Ua NEGATIVE NEGATIVE  Amnisure rupture of membrane (rom)not at [facility]     Status: None   Collection Time: 06/21/20  9:50 AM  Result Value Ref Range   Amnisure ROM NEGATIVE    --/--/A POS (01/18 1502)  Imaging:  [place] MFM FETAL BPP WO NON STRESS  Result Date: 06/14/2020 ----------------------------------------------------------------------  OBSTETRICS REPORT                         (Signed Final 06/14/2020 09:30 am)  ---------------------------------------------------------------------- Patient Info  ID #:        [id number]                          D.O.B.:  1980/10/06 (39 yrs)  Name:        Donna Burns                Visit Date: 06/14/2020 08:09 am ---------------------------------------------------------------------- Performed By  Attending:         Ma Rings MD  Ref. Address:      [address]                                                               [address]  Performed By:      Hurman Horn RDMS     [facility]  Referred By:       Yehuda Mao MD ---------------------------------------------------------------------- Orders  #   Description                          Code         Ordered By  1   [place] MFM FETAL BPP WO NON              76819.01     CECILIA BANGA      STRESS ----------------------------------------------------------------------  #   Order #                    Accession #                 Episode #  1   [id number]                  [id number]                  [id number] ---------------------------------------------------------------------- Indications  Hypertension - Chronic/Pre-existing(Labetalol)  O50.019  Advanced maternal age multigravida 35+, third   O24.523  trimester  [redacted] weeks gestation of pregnancy                 Z3A.32  Herpes simplex virus (HSV)                      O98.519 B00.9  Obesity complicating pregnancy, third trimester O99.213 ---------------------------------------------------------------------- Fetal Evaluation  Num Of Fetuses:          1  Fetal Heart Rate(bpm):   132  Cardiac Activity:        Observed  Presentation:            Cephalic  Placenta:                Anterior  P. Cord Insertion:       Visualized  Amniotic Fluid  AFI FV:      Subjectively low-normal  AFI Sum(cm)     %Tile       Largest Pocket(cm)  7.7             3.3         3.7  RUQ(cm)       RLQ(cm)         LUQ(cm)        LLQ(cm)  1.6           0.9            3.7            1.5 ---------------------------------------------------------------------- Biophysical Evaluation  Amniotic F.V:   Pocket => 2 cm              F. Tone:         Observed  F. Movement:    Observed                    Score:           8/8  F. Breathing:   Observed ---------------------------------------------------------------------- Gestational Age  Best:           32w 3d    Det. By:  U/S C R L  (12/23/19)      EDD:  08/06/20 ---------------------------------------------------------------------- Comments  Shoni Quijas is a 40 year old gravida 1 para 0 currently at  32 weeks and 3 days.  She was admitted last evening after she  had a nonreactive NST and a biophysical profile that was 6 out  of 8 in the office.  She was then sent to the hospital for  extended monitoring.  Her pregnancy has been complicated by advanced maternal  age, obesity, chronic kidney disease, and chronic hypertension  that is currently treated with nifedipine and labetalol.  She had a growth ultrasound performed in our office about 5  days ago showing an EFW of 3 pounds 13 ounces (25th  percentile for her gestational age).  There was normal amniotic  fluid with a total AFI of 11.1 cm noted.  Since being admitted to the hospital overnight, her fetal heart  rate tracing has been reactive overall.  Occasional mild variable  decelerations were noted.  There has been good variability  noted on her fetal heart rate tracing. The patient reports that she  has been feeling fetal movements throughout the night.  Her PIH labs are all within normal limits except for the elevated  serum creatinine level, which has remained stable as compared  to her prior serum creatinine levels.  Her P/C was 0.12.  A biophysical profile performed today was 8 out of 8.  Low normal amniotic fluid with a total AFI of 7.7 cm was noted  today.  There was a 3.7 cm maximal vertical pocket noted.  The  patient will be placed back on continuous monitoring this  morning.  She may be discharged home later this afternoon  should her fetal heart rate tracing continue to be reactive.  She  should receive the second dose of antenatal corticosteroids  prior to discharge.  She should continue twice-weekly fetal testing in your office.  She already has another nonstress test scheduled in 2 days.  She should have another fluid check performed when she  presents for her next nonstress test. ---------------------------------------------------------------------- Recommendations  Discharge home later this afternoon should her NST continue to  be reactive  She should receive the second dose of betamethasone prior to  discharge  Continue twice weekly fetal testing in your office  Repeat fluid check in 2 days when she presents for her next  NST ----------------------------------------------------------------------                    Ma Rings, MD Electronically Signed Final Report   06/14/2020 09:30 am ----------------------------------------------------------------------  [place] MFM OB FOLLOW UP  Result Date: 06/09/2020 ----------------------------------------------------------------------  OBSTETRICS REPORT                       (  Signed Final 06/09/2020 03:25 pm) ---------------------------------------------------------------------- Patient Info  ID #:       [id number]                          D.O.B.:  05-29-1980 (39 yrs)  Name:       Donna Burns                Visit Date: 06/09/2020 01:55 pm ---------------------------------------------------------------------- Performed By  Attending:        Ma Rings MD         Ref. Address:     [address]                                                             [address]  Performed By:     Emeline Darling BS,      [facility]  Referred By:      Yehuda Mao MD ---------------------------------------------------------------------- Orders  #  Description                           Code        Ordered By  1  [place] MFM OB FOLLOW UP                   78295.62    Noralee Space ----------------------------------------------------------------------  #  Order #                     Accession #                Episode #  1  [id number]                   [id number]                 [id number] ---------------------------------------------------------------------- Indications  Hypertension - Chronic/Pre-                    O10.019  existing(Labetalol)  Obesity complicating pregnancy, second         O99.212  trimester  Advanced maternal age multigravida 32+,        O53.522  second trimester  Vaginal discharge during pregnancy in          O26.892  second trimester  Herpes simplex virus (HSV)                     O98.519 B00.9  [redacted] weeks gestation of pregnancy                Z3A.31  Encounter for other antenatal screening        Z36.2  follow-up ---------------------------------------------------------------------- Fetal Evaluation  Num Of Fetuses:         1  Fetal Heart Rate(bpm):  137  Cardiac Activity:       Observed  Presentation:           Cephalic  Placenta:               Anterior Fundal  P. Cord Insertion:      Previously Visualized  Amniotic Fluid  AFI FV:      Within normal limits  AFI Sum(cm)     %Tile       Largest Pocket(cm)  11.14           24          3.94  RUQ(cm)       RLQ(cm)       LUQ(cm)        LLQ(cm)  3.94          3.35          2.32           1.53 ---------------------------------------------------------------------- Biometry  BPD:      80.2  mm     G. Age:  32w 1d         56  %    CI:        78.12   %    70 - 86                                                          FL/HC:      19.9   %    19.1 - 21.3  HC:      287.1  mm     G. Age:  31w  4d         12  %    HC/AC:      1.03        0.96 - 1.17  AC:       278   mm     G. Age:  31w 6d         52  %    FL/BPD:     71.2   %    71 - 87  FL:       57.1  mm     G. Age:  30w 0d        4.7  %    FL/AC:      20.5   %    20 - 24  Est. FW:    1733  gm    3 lb 13 oz      25  % ----------------------------------------------------------------------  Gestational Age  U/S Today:     31w 3d                                        EDD:   08/08/20  Best:          31w 5d     Det. By:  U/S C R L  (12/23/19)    EDD:   08/06/20 ---------------------------------------------------------------------- Anatomy  Cranium:               Appears normal         LVOT:                   Previously seen  Cavum:                 Not well visualized    Aortic Arch:            Previously seen  Ventricles:            Appears normal         Ductal Arch:            Previously seen  Choroid Plexus:        Previously seen        Diaphragm:              Previously seen  Cerebellum:            Previously seen        Stomach:                Appears normal, left                                                                        sided  Posterior Fossa:       Previously seen        Abdomen:                Appears normal  Nuchal Fold:           Not applicable (>20    Abdominal Wall:         Previously seen                         wks GA)  Face:                  Orbits and profile     Cord Vessels:           Previously seen                         previously seen  Lips:                  Previously seen        Kidneys:                Appear normal  Palate:                Previously seen on     Bladder:  Appears normal                         images  Thoracic:              Previously seen        Spine:                  Previously seen  Heart:                 Previously seen        Upper Extremities:      Previously seen  RVOT:                  Previously seen        Lower Extremities:      Previously seen  Other:  Prominent Cavum Vergae  demonstrated. Nasal bone visualized.          Fetus appears to be a female. Heels/feet and open hands/5th digits          visualized. VC, 3VV and 3VTV visualized. ---------------------------------------------------------------------- Cervix Uterus Adnexa  Cervix  Not visualized (advanced GA >24wks) ---------------------------------------------------------------------- Comments  This patient was seen for a follow up growth scan due to  chronic hypertension currently treated with labetalol (600 mg  daily).  She was diagnosed with COVID-19 at the end of  December.  She reports that she has already received the  complete course of the COVID-19 vaccine including the  booster shot.  She denies any symptoms currently.  She was informed that the fetal growth and amniotic fluid  level appears appropriate for her gestational age.  Due to chronic hypertension, she is already scheduled to  start twice weekly fetal testing in your office next week.  She  should continue these tests until delivery.  A follow up exam was scheduled in 4 weeks to assess the  fetal growth. ----------------------------------------------------------------------                   Ma Rings, MD Electronically Signed Final Report   06/09/2020 03:25 pm ----------------------------------------------------------------------   MAU Course/MDM: Orders Placed This Encounter  Procedures  . [place] MFM OB Limited  . [place] MFM Fetal BPP Wo Non Stress  . Urinalysis, Routine w reflex microscopic Urine, Clean Catch  . Amnisure rupture of membrane (rom)not at [facility]  . Discharge patient    No orders of the defined types were placed in this encounter.    NST reviewed and reactive No s/sx of PEC, BP borderline with some mildly elevated BPs, similar to pt baseline No evidence of rupture of membranes with negative pooling, ferning and amnisure today  MFM Limited OB US and BPP done in MAU AFI still low normal at 5.3, with largest pocket >2 BPP is 8/8 Consult Dr  Judeth Cornfield who reviewed [place] results and recommends pt continue weekly testing as scheduled for her [facility] Reviewed MFM recommendations with Dr Reina Fuse as well and antenatal testing set up in the office next week Precautions reviewed with pt/reasons to return to MAU   Assessment: 1. Stage 1 chronic kidney disease   2. Pre-existing hypertensive chronic kidney disease affecting pregnancy in third trimester   3. Oligohydramnios in third trimester, single or unspecified fetus   4. [redacted] weeks gestation of pregnancy     Plan: Discharge home Labor precautions and fetal kick counts  Follow-up Information    Bovard-Stuckert, Jody, MD Follow up.   Specialty: Obstetrics and Gynecology  Why: Follow up next week as scheduled, return to MAU as needed for emergencies. Contact information: [address] [place] [address] [phone]              Allergies as of 06/21/2020      Reactions   No Known Allergies    Pork-derived Products Other (See Comments)   Pt does not eat pork or use pork-derived products      Medication List    TAKE these medications   aspirin EC 81 MG tablet Take 81 mg by mouth daily. Swallow whole.   labetalol 200 MG tablet Commonly known as: NORMODYNE Take 2 tablets (400 mg total) by mouth 2 (two) times daily. TAKE 2 TABLETS BY MOUTH TWICE DAILY.   NIFEdipine 60 MG 24 hr tablet Commonly known as: ADALAT CC Take 60 mg by mouth daily.   PNV PO Take by mouth.       Sharen CounterLisa Leftwich-Kirby Certified Nurse-Midwife 06/21/2020 1:54 PM

## 2020-06-23 DIAGNOSIS — O99213 Obesity complicating pregnancy, third trimester: Secondary | ICD-10-CM | POA: Diagnosis not present

## 2020-06-23 DIAGNOSIS — O09513 Supervision of elderly primigravida, third trimester: Secondary | ICD-10-CM | POA: Diagnosis not present

## 2020-06-23 DIAGNOSIS — Z3A33 33 weeks gestation of pregnancy: Secondary | ICD-10-CM | POA: Diagnosis not present

## 2020-06-23 DIAGNOSIS — N183 Chronic kidney disease, stage 3 unspecified: Secondary | ICD-10-CM | POA: Diagnosis not present

## 2020-06-23 DIAGNOSIS — Z3490 Encounter for supervision of normal pregnancy, unspecified, unspecified trimester: Secondary | ICD-10-CM | POA: Diagnosis not present

## 2020-06-23 DIAGNOSIS — O4100X0 Oligohydramnios, unspecified trimester, not applicable or unspecified: Secondary | ICD-10-CM | POA: Diagnosis not present

## 2020-06-23 DIAGNOSIS — I129 Hypertensive chronic kidney disease with stage 1 through stage 4 chronic kidney disease, or unspecified chronic kidney disease: Secondary | ICD-10-CM | POA: Diagnosis not present

## 2020-06-27 ENCOUNTER — Inpatient Hospital Stay (HOSPITAL_COMMUNITY): Payer: Medicaid Other | Admitting: Anesthesiology

## 2020-06-27 ENCOUNTER — Other Ambulatory Visit: Payer: Self-pay

## 2020-06-27 ENCOUNTER — Encounter (HOSPITAL_COMMUNITY): Payer: Self-pay | Admitting: Obstetrics and Gynecology

## 2020-06-27 ENCOUNTER — Encounter (HOSPITAL_COMMUNITY)
Admission: AD | Disposition: A | Discharge status: Home / Self Care | Payer: Self-pay | Source: Home / Self Care | Attending: Obstetrics and Gynecology

## 2020-06-27 ENCOUNTER — Inpatient Hospital Stay (HOSPITAL_COMMUNITY)
Admission: AD | Admit: 2020-06-27 | Discharge: 2020-06-30 | DRG: 787 | Disposition: A | Discharge status: Home / Self Care | Payer: Medicaid Other | Attending: Obstetrics and Gynecology | Admitting: Obstetrics and Gynecology

## 2020-06-27 DIAGNOSIS — Z3A34 34 weeks gestation of pregnancy: Secondary | ICD-10-CM | POA: Diagnosis not present

## 2020-06-27 DIAGNOSIS — O139 Gestational [pregnancy-induced] hypertension without significant proteinuria, unspecified trimester: Secondary | ICD-10-CM | POA: Diagnosis not present

## 2020-06-27 DIAGNOSIS — O4103X0 Oligohydramnios, third trimester, not applicable or unspecified: Secondary | ICD-10-CM | POA: Diagnosis present

## 2020-06-27 DIAGNOSIS — O99214 Obesity complicating childbirth: Secondary | ICD-10-CM | POA: Diagnosis not present

## 2020-06-27 DIAGNOSIS — Z3490 Encounter for supervision of normal pregnancy, unspecified, unspecified trimester: Secondary | ICD-10-CM

## 2020-06-27 DIAGNOSIS — O1002 Pre-existing essential hypertension complicating childbirth: Secondary | ICD-10-CM | POA: Diagnosis not present

## 2020-06-27 DIAGNOSIS — I129 Hypertensive chronic kidney disease with stage 1 through stage 4 chronic kidney disease, or unspecified chronic kidney disease: Secondary | ICD-10-CM | POA: Diagnosis present

## 2020-06-27 DIAGNOSIS — Z8616 Personal history of COVID-19: Secondary | ICD-10-CM | POA: Diagnosis not present

## 2020-06-27 DIAGNOSIS — O4100X0 Oligohydramnios, unspecified trimester, not applicable or unspecified: Secondary | ICD-10-CM | POA: Diagnosis not present

## 2020-06-27 DIAGNOSIS — N183 Chronic kidney disease, stage 3 unspecified: Secondary | ICD-10-CM | POA: Diagnosis not present

## 2020-06-27 DIAGNOSIS — O1022 Pre-existing hypertensive chronic kidney disease complicating childbirth: Secondary | ICD-10-CM | POA: Diagnosis not present

## 2020-06-27 LAB — CBC
HCT: 40.7 % (ref 36.0–46.0)
Hemoglobin: 13.4 g/dL (ref 12.0–15.0)
MCH: 31 pg (ref 26.0–34.0)
MCHC: 32.9 g/dL (ref 30.0–36.0)
MCV: 94.2 fL (ref 80.0–100.0)
Platelets: 168 10*3/uL (ref 150–400)
RBC: 4.32 MIL/uL (ref 3.87–5.11)
RDW: 14.2 % (ref 11.5–15.5)
WBC: 10.6 10*3/uL — ABNORMAL HIGH (ref 4.0–10.5)
nRBC: 0 % (ref 0.0–0.2)

## 2020-06-27 LAB — BASIC METABOLIC PANEL
Anion gap: 9 (ref 5–15)
BUN: 11 mg/dL (ref 6–20)
CO2: 20 mmol/L — ABNORMAL LOW (ref 22–32)
Calcium: 10.2 mg/dL (ref 8.9–10.3)
Chloride: 107 mmol/L (ref 98–111)
Creatinine, Ser: 1.12 mg/dL — ABNORMAL HIGH (ref 0.44–1.00)
GFR, Estimated: >60 mL/min (ref >60)
Glucose, Bld: 86 mg/dL (ref 70–99)
Potassium: 4.3 mmol/L (ref 3.5–5.1)
Sodium: 136 mmol/L (ref 135–145)

## 2020-06-27 LAB — TYPE AND SCREEN
ABO/RH(D): A POS
Antibody Screen: NEGATIVE

## 2020-06-27 SURGERY — Surgical Case
Anesthesia: Spinal

## 2020-06-27 MED ORDER — DEXAMETHASONE SODIUM PHOSPHATE 4 MG/ML IJ SOLN
INTRAMUSCULAR | Status: AC
  Filled 2020-06-27: qty 1

## 2020-06-27 MED ORDER — COCONUT OIL OIL: 1.0000 "application " | TOPICAL_OIL | Status: DC | PRN

## 2020-06-27 MED ORDER — PHENYLEPHRINE HCL-NACL 20-0.9 MG/250ML-% IV SOLN
INTRAVENOUS | Status: DC | PRN
  Administered 2020-06-27: 60 ug/min via INTRAVENOUS

## 2020-06-27 MED ORDER — ACETAMINOPHEN 500 MG PO TABS
1000.0000 mg | ORAL_TABLET | Freq: Four times a day (QID) | ORAL | Status: DC
  Administered 2020-06-27 – 2020-06-30 (×10): 1000 mg via ORAL
  Filled 2020-06-27 (×11): qty 2

## 2020-06-27 MED ORDER — NIFEDIPINE ER OSMOTIC RELEASE 30 MG PO TB24
60.0000 mg | ORAL_TABLET | Freq: Every day | ORAL | Status: DC
  Administered 2020-06-27 – 2020-06-29 (×3): 60 mg via ORAL
  Filled 2020-06-27 (×3): qty 2

## 2020-06-27 MED ORDER — LACTATED RINGERS IV SOLN: Freq: Once | INTRAVENOUS | Status: AC

## 2020-06-27 MED ORDER — MENTHOL 3 MG MT LOZG: 1.0000 | LOZENGE | OROMUCOSAL | Status: DC | PRN

## 2020-06-27 MED ORDER — SIMETHICONE 80 MG PO CHEW
80.0000 mg | CHEWABLE_TABLET | Freq: Three times a day (TID) | ORAL | Status: DC
  Administered 2020-06-27 – 2020-06-30 (×8): 80 mg via ORAL
  Filled 2020-06-27 (×8): qty 1

## 2020-06-27 MED ORDER — MEPERIDINE HCL 25 MG/ML IJ SOLN
6.2500 mg | INTRAMUSCULAR | Status: DC | PRN
Start: 2020-06-27 — End: 2020-06-27

## 2020-06-27 MED ORDER — LACTATED RINGERS IV SOLN: INTRAVENOUS | Status: DC

## 2020-06-27 MED ORDER — PHENYLEPHRINE HCL-NACL 20-0.9 MG/250ML-% IV SOLN
INTRAVENOUS | Status: AC
  Filled 2020-06-27: qty 250

## 2020-06-27 MED ORDER — IBUPROFEN 800 MG PO TABS
800.0000 mg | ORAL_TABLET | Freq: Four times a day (QID) | ORAL | Status: DC
  Filled 2020-06-27: qty 1

## 2020-06-27 MED ORDER — HYDROMORPHONE HCL 1 MG/ML IJ SOLN
INTRAMUSCULAR | Status: AC
  Filled 2020-06-27: qty 0.5

## 2020-06-27 MED ORDER — OXYCODONE HCL 5 MG/5ML PO SOLN: 5.0000 mg | Freq: Once | ORAL | Status: DC | PRN

## 2020-06-27 MED ORDER — POVIDONE-IODINE 10 % EX SWAB
2.0000 "application " | Freq: Once | CUTANEOUS | Status: AC
  Administered 2020-06-27: 2 via TOPICAL

## 2020-06-27 MED ORDER — SCOPOLAMINE 1 MG/3DAYS TD PT72
MEDICATED_PATCH | TRANSDERMAL | Status: AC
  Filled 2020-06-27: qty 1

## 2020-06-27 MED ORDER — DEXTROSE 5 % IV SOLN
INTRAVENOUS | Status: AC
  Filled 2020-06-27: qty 3000

## 2020-06-27 MED ORDER — BUPIVACAINE IN DEXTROSE 0.75-8.25 % IT SOLN
INTRATHECAL | Status: DC | PRN
  Administered 2020-06-27: 1.7 mL via INTRATHECAL

## 2020-06-27 MED ORDER — DIPHENHYDRAMINE HCL 25 MG PO CAPS: 25.0000 mg | ORAL_CAPSULE | Freq: Four times a day (QID) | ORAL | Status: DC | PRN

## 2020-06-27 MED ORDER — ONDANSETRON HCL 4 MG/2ML IJ SOLN
INTRAMUSCULAR | Status: DC | PRN
  Administered 2020-06-27: 4 mg via INTRAVENOUS

## 2020-06-27 MED ORDER — ONDANSETRON HCL 4 MG/2ML IJ SOLN
INTRAMUSCULAR | Status: AC
  Filled 2020-06-27: qty 2

## 2020-06-27 MED ORDER — MEPERIDINE HCL 25 MG/ML IJ SOLN: 6.2500 mg | INTRAMUSCULAR | Status: DC | PRN

## 2020-06-27 MED ORDER — MORPHINE SULFATE (PF) 0.5 MG/ML IJ SOLN
INTRAMUSCULAR | Status: DC | PRN
  Administered 2020-06-27: .15 mg via INTRATHECAL

## 2020-06-27 MED ORDER — SCOPOLAMINE 1 MG/3DAYS TD PT72
1.0000 | MEDICATED_PATCH | Freq: Once | TRANSDERMAL | Status: AC
  Administered 2020-06-27: 1.5 mg via TRANSDERMAL

## 2020-06-27 MED ORDER — OXYTOCIN-SODIUM CHLORIDE 30-0.9 UT/500ML-% IV SOLN
INTRAVENOUS | Status: DC | PRN
  Administered 2020-06-27: 150 mL via INTRAVENOUS

## 2020-06-27 MED ORDER — KETOROLAC TROMETHAMINE 30 MG/ML IJ SOLN
30.0000 mg | Freq: Four times a day (QID) | INTRAMUSCULAR | Status: AC
  Administered 2020-06-27 – 2020-06-28 (×4): 30 mg via INTRAVENOUS
  Filled 2020-06-27 (×4): qty 1

## 2020-06-27 MED ORDER — PROMETHAZINE HCL 25 MG/ML IJ SOLN: 6.2500 mg | INTRAMUSCULAR | Status: DC | PRN

## 2020-06-27 MED ORDER — DEXAMETHASONE SODIUM PHOSPHATE 4 MG/ML IJ SOLN
INTRAMUSCULAR | Status: DC | PRN
  Administered 2020-06-27: 5 mg via INTRAVENOUS

## 2020-06-27 MED ORDER — MORPHINE SULFATE (PF) 0.5 MG/ML IJ SOLN
INTRAMUSCULAR | Status: AC
  Filled 2020-06-27: qty 10

## 2020-06-27 MED ORDER — FENTANYL CITRATE (PF) 100 MCG/2ML IJ SOLN
INTRAMUSCULAR | Status: DC | PRN
  Administered 2020-06-27: 15 ug via INTRATHECAL

## 2020-06-27 MED ORDER — DEXAMETHASONE SODIUM PHOSPHATE 10 MG/ML IJ SOLN
INTRAMUSCULAR | Status: AC
  Filled 2020-06-27: qty 1

## 2020-06-27 MED ORDER — ZOLPIDEM TARTRATE 5 MG PO TABS: 5.0000 mg | ORAL_TABLET | Freq: Every evening | ORAL | Status: DC | PRN

## 2020-06-27 MED ORDER — FENTANYL CITRATE (PF) 100 MCG/2ML IJ SOLN
INTRAMUSCULAR | Status: AC
  Filled 2020-06-27: qty 2

## 2020-06-27 MED ORDER — SENNOSIDES-DOCUSATE SODIUM 8.6-50 MG PO TABS
2.0000 | ORAL_TABLET | Freq: Every day | ORAL | Status: DC
  Administered 2020-06-28 – 2020-06-30 (×3): 2 via ORAL
  Filled 2020-06-27 (×3): qty 2

## 2020-06-27 MED ORDER — TETANUS-DIPHTH-ACELL PERTUSSIS 5-2.5-18.5 LF-MCG/0.5 IM SUSY: 0.5000 mL | PREFILLED_SYRINGE | Freq: Once | INTRAMUSCULAR | Status: DC

## 2020-06-27 MED ORDER — OXYCODONE HCL 5 MG PO TABS
5.0000 mg | ORAL_TABLET | Freq: Once | ORAL | Status: DC | PRN
Start: 2020-06-27 — End: 2020-06-27

## 2020-06-27 MED ORDER — OXYTOCIN-SODIUM CHLORIDE 30-0.9 UT/500ML-% IV SOLN: 2.5000 [IU]/h | INTRAVENOUS | Status: AC

## 2020-06-27 MED ORDER — SIMETHICONE 80 MG PO CHEW: 80.0000 mg | CHEWABLE_TABLET | ORAL | Status: DC | PRN

## 2020-06-27 MED ORDER — DEXTROSE 5 % IV SOLN
3.0000 g | INTRAVENOUS | Status: AC
  Administered 2020-06-27: 3 g via INTRAVENOUS

## 2020-06-27 MED ORDER — LABETALOL HCL 200 MG PO TABS
400.0000 mg | ORAL_TABLET | Freq: Two times a day (BID) | ORAL | Status: DC
  Administered 2020-06-27 – 2020-06-28 (×2): 400 mg via ORAL
  Filled 2020-06-27 (×3): qty 2

## 2020-06-27 MED ORDER — OXYCODONE HCL 5 MG PO TABS
5.0000 mg | ORAL_TABLET | ORAL | Status: DC | PRN
  Administered 2020-06-29: 10 mg via ORAL
  Administered 2020-06-29 (×2): 5 mg via ORAL
  Filled 2020-06-27 (×2): qty 1
  Filled 2020-06-27: qty 2

## 2020-06-27 MED ORDER — HYDROMORPHONE HCL 1 MG/ML IJ SOLN
0.2500 mg | INTRAMUSCULAR | Status: DC | PRN
  Administered 2020-06-27: 0.25 mg via INTRAVENOUS

## 2020-06-27 MED ORDER — PRENATAL MULTIVITAMIN CH
1.0000 | ORAL_TABLET | Freq: Every day | ORAL | Status: DC
  Administered 2020-06-28 – 2020-06-29 (×2): 1 via ORAL
  Filled 2020-06-27 (×2): qty 1

## 2020-06-27 SURGICAL SUPPLY — 40 items
ADH SKN CLS APL DERMABOND .7 (GAUZE/BANDAGES/DRESSINGS) ×1
CHLORAPREP W/TINT 26ML (MISCELLANEOUS) ×2 IMPLANT
CLAMP CORD UMBIL (MISCELLANEOUS) IMPLANT
CLOTH BEACON ORANGE TIMEOUT ST (SAFETY) ×2 IMPLANT
DERMABOND ADVANCED (GAUZE/BANDAGES/DRESSINGS) ×1
DERMABOND ADVANCED .7 DNX12 (GAUZE/BANDAGES/DRESSINGS) ×1 IMPLANT
DRESSING PREVENA PLUS CUSTOM (GAUZE/BANDAGES/DRESSINGS) IMPLANT
DRSG OPSITE POSTOP 4X10 (GAUZE/BANDAGES/DRESSINGS) ×2 IMPLANT
DRSG PREVENA PLUS CUSTOM (GAUZE/BANDAGES/DRESSINGS) ×2
ELECT REM PT RETURN 9FT ADLT (ELECTROSURGICAL) ×2
ELECTRODE REM PT RTRN 9FT ADLT (ELECTROSURGICAL) ×1 IMPLANT
EXTRACTOR VACUUM BELL STYLE (SUCTIONS) IMPLANT
GAUZE SPONGE 4X4 12PLY STRL LF (GAUZE/BANDAGES/DRESSINGS) IMPLANT
GLOVE BIO SURGEON STRL SZ 6 (GLOVE) ×2 IMPLANT
GLOVE BIOGEL PI IND STRL 6.5 (GLOVE) ×1 IMPLANT
GLOVE BIOGEL PI IND STRL 7.0 (GLOVE) ×2 IMPLANT
GLOVE BIOGEL PI INDICATOR 6.5 (GLOVE) ×1
GLOVE BIOGEL PI INDICATOR 7.0 (GLOVE) ×2
GOWN STRL REUS W/TWL LRG LVL3 (GOWN DISPOSABLE) ×4 IMPLANT
KIT ABG SYR 3ML LUER SLIP (SYRINGE) IMPLANT
NDL HYPO 25X5/8 SAFETYGLIDE (NEEDLE) IMPLANT
NEEDLE HYPO 25X5/8 SAFETYGLIDE (NEEDLE) IMPLANT
NS IRRIG 1000ML POUR BTL (IV SOLUTION) ×2 IMPLANT
PACK C SECTION WH (CUSTOM PROCEDURE TRAY) ×2 IMPLANT
PAD ABD 8X10 STRL (GAUZE/BANDAGES/DRESSINGS) IMPLANT
PAD OB MATERNITY 4.3X12.25 (PERSONAL CARE ITEMS) ×2 IMPLANT
PENCIL SMOKE EVAC W/HOLSTER (ELECTROSURGICAL) ×2 IMPLANT
RTRCTR C-SECT PINK 25CM LRG (MISCELLANEOUS) IMPLANT
SUT PDS AB 0 CTX 36 PDP370T (SUTURE) IMPLANT
SUT PLAIN 2 0 (SUTURE)
SUT PLAIN ABS 2-0 CT1 27XMFL (SUTURE) IMPLANT
SUT VIC AB 0 CT1 36 (SUTURE) ×4 IMPLANT
SUT VIC AB 2-0 CT1 27 (SUTURE) ×2
SUT VIC AB 2-0 CT1 TAPERPNT 27 (SUTURE) ×1 IMPLANT
SUT VIC AB 3-0 SH 27 (SUTURE)
SUT VIC AB 3-0 SH 27X BRD (SUTURE) IMPLANT
SUT VIC AB 4-0 KS 27 (SUTURE) ×2 IMPLANT
TOWEL OR 17X24 6PK STRL BLUE (TOWEL DISPOSABLE) ×2 IMPLANT
TRAY FOLEY W/BAG SLVR 14FR LF (SET/KITS/TRAYS/PACK) ×2 IMPLANT
WATER STERILE IRR 1000ML POUR (IV SOLUTION) ×2 IMPLANT

## 2020-06-27 NOTE — Anesthesia Postprocedure Evaluation (Signed)
Anesthesia Post Note  Patient: Donna Burns  Procedure(s) Performed: CESAREAN SECTION (N/A )     Patient location during evaluation: PACU Anesthesia Type: Spinal Level of consciousness: awake and alert and oriented Pain management: pain level controlled Vital Signs Assessment: post-procedure vital signs reviewed and stable Respiratory status: spontaneous breathing, nonlabored ventilation and respiratory function stable Cardiovascular status: blood pressure returned to baseline and stable Postop Assessment: no headache, no backache, spinal receding and patient able to bend at knees Anesthetic complications: no   No complications documented.  Last Vitals:  Vitals:   06/27/20 1622 06/27/20 1630  BP:  (!) 146/88  Pulse: 70 81  Resp:  18  Temp:  36.6 C  SpO2: 100% 100%    Last Pain:  Vitals:   06/27/20 1630  TempSrc: Oral  PainSc: 2    Pain Goal:                   [name]

## 2020-06-27 NOTE — Progress Notes (Signed)
Patient ID: Donna Burns, female   DOB: Jun 08, 1980, 40 y.o.   MRN: [id number] Pt doing well. Pain well controlled. No complaints at this time. Mostly worried about baby VS: 148/88, 81 GEN - NAD ABD - dressing c/d/i EXT - scds in place  A/P: POD#0 s/p pltc/s due to non reassuring fetal assessment at 34 weeks         Routine post op care         Procardia 60mg  xl qhs and labetalol 400mg  po bid

## 2020-06-27 NOTE — Transfer of Care (Signed)
Immediate Anesthesia Transfer of Care Note  Patient: Donna Burns  Procedure(s) Performed: CESAREAN SECTION (N/A )  Patient Location: PACU  Anesthesia Type:Spinal  Level of Consciousness: awake, alert  and oriented  Airway & Oxygen Therapy: Patient Spontanous Breathing  Post-op Assessment: Report given to RN and Post -op Vital signs reviewed and stable  Post vital signs: Reviewed and stable  Last Vitals:  Vitals Value Taken Time  BP 127/85 06/27/20 1523  Temp    Pulse 73 06/27/20 1524  Resp 16 06/27/20 1524  SpO2 100 % 06/27/20 1524  Vitals shown include unvalidated device data.  Last Pain:  Vitals:   06/27/20 1245  TempSrc: Oral         Complications: No complications documented.

## 2020-06-27 NOTE — Lactation Note (Signed)
This note was copied from a baby's chart. Lactation Consultation Note  Patient Name: Donna Burns [id number] Date: 06/27/2020 Reason for consult: Initial assessment;Primapara;Late-preterm 34-36.6wks;1st time breastfeeding;Infant < 6lbs;NICU baby Age:41 hours  Visited with mom of 4 hours old LPI NICU female, she's a P1 and reported (+) breast changes during the pregnancy. Her RN Pam was getting her set up with a DEBP when LC arrived in the room, LC finished up the education and reviewed instructions, cleaning, storage and breastmilk storage guidelines as well. Even though she has a Medela DEBP at home, she signed a [facility] referral to the [facility] to request a hospital grade DEBP, it will be faxed today.  Mom started pumping while on [facility] consultation, praised her for her efforts. LC also showed her how to hand express, small droplets of colostrum noted on her right breast. She was still pumping when exiting the room. Reviewed pumping schedule, lactogenesis II and benefits of premature milk. Mom understands that pumping this early on is mainly for breast stimulation and not to get volume.  Feeding plan:  1. Encouraged mom to pump every 3 hours, at least 8 times/24 hours 2. Hand expression and breast massage were also encouraged prior pumping  BF brochure, BF resources and NICU booklet were reviewed. Dad present at the time of [facility] consultation but he was falling asleep. Mom reported all questions and concerns were answered, she's aware of LC OP services and will call PRN.   Maternal Data Has patient been taught Hand Expression?: Yes Does the patient have breastfeeding experience prior to this delivery?: No  Feeding Mother's Current Feeding Choice: Breast Milk and Formula  LATCH Score    Lactation Tools Discussed/Used Tools: Pump Breast pump type: Double-Electric Breast Pump Pump Education: Setup, frequency, and cleaning;Milk Storage Pumping frequency: q' 3 hours Pumped volume:   (N/A)  Interventions Interventions: Breast feeding basics reviewed;DEBP;Breast massage;Hand express  Discharge Pump: DEBP;Personal (Medela DEBP at home) [facility] Program: Yes  Consult Status Consult Status: Follow-up Date: 06/28/20 Follow-up type: In-patient    Lynzi Meulemans Venetia Constable 06/27/2020, 6:20 PM

## 2020-06-27 NOTE — H&P (Signed)
Donna Burns is a 40 y.o. female presenting for scheduled cesarean. Patient endorses +FM, denies VB, LOF, CTX. Was seen in clinic today for NST for issues as listed below, BPP done for non-reactive NST. 4/8 with total AFI 2.5 and no 2cm pocket. Points lost for breathing and fluid.   PNC c/b the following 1) CKD stage 3 - followed by nephrology, stable creatinine last 1/36 on 12/29. 2) CHTN on medication - controlled at this time on Procardia 60XL and Labetalol 400mg  BID. Took CCB but is due for med dose at this time. Denies PreE symptoms.  3) BMI 47 4) H/o HSV - asx 5) COVID positive - sxs began 12/28, positive test 12/29  Patient was admitted 1/18-19 with 6/8 BPP for continuous monitoring and, during this admission, received BMTZ. Saw MFM and NICU during this admission. AFI during this admission was 11. Repeat AFI on 1/26 7/2 with SDP of 2cm OB History    Gravida  1   Para  0   Term  0   Preterm  0   AB  0   Living  0     SAB  0   IAB  0   Ectopic  0   Multiple  0   Live Births  0          Past Medical History:  Diagnosis Date  . Bilateral polycystic ovarian syndrome   . Chronic kidney disease   . HSV-2 (herpes simplex virus 2) infection   . Hypertension 2008  . Prediabetes   . Vaginal Pap smear, abnormal    Past Surgical History:  Procedure Laterality Date  . KNEE SURGERY Right 2015   torn cartilage--arthroscopic  . MOUTH SURGERY  2016   9 teeth pulled for decay-upper incisors.   Family History: family history includes Breast cancer in her mother; Dementia in her father; Diabetes in her mother; Heart failure in her mother; Hypertension in her father, mother, and sister; Other in her sister; Stroke in her father. Social History:  reports that she has never smoked. She has never used smokeless tobacco. She reports that she does not drink alcohol and does not use drugs.     Maternal Diabetes: No1hr 147, 3hr 0 of 4 Genetic Screening: Normal Maternal  Ultrasounds/Referrals: Normal Fetal Ultrasounds or other Referrals:  None Maternal Substance Abuse:  No Significant Maternal Medications:  Meds include: Other: Procardia, Labetalol Significant Maternal Lab Results:  None Other Comments:  None  Review of Systems  Constitutional: Negative for chills and fever.  Respiratory: Negative for shortness of breath.   Cardiovascular: Negative for chest pain, palpitations and leg swelling.  Gastrointestinal: Negative for abdominal pain and vomiting.  Neurological: Negative for dizziness, weakness and headaches.  Psychiatric/Behavioral: Negative for suicidal ideas.   Maternal Medical History:  Fetal activity: Perceived fetal activity is normal.   Last perceived fetal movement was within the past hour.    Prenatal complications: PIH and oligohydramnios.   No pre-eclampsia.       Last menstrual period 10/06/2019. Exam Physical Exam Constitutional:      General: She is not in acute distress.    Appearance: She is well-developed and well-nourished.  HENT:     Head: Normocephalic and atraumatic.  Eyes:     Pupils: Pupils are equal, round, and reactive to light.  Cardiovascular:     Rate and Rhythm: Normal rate and regular rhythm.     Heart sounds: No murmur heard. No gallop.   Abdominal:  Tenderness: There is no abdominal tenderness. There is no guarding or rebound.     Comments: GRAVID  Genitourinary:    Vagina: Normal.     Uterus: Normal.   Musculoskeletal:        General: Normal range of motion.     Cervical back: Normal range of motion and neck supple.  Skin:    General: Skin is warm and dry.  Neurological:     Mental Status: She is alert and oriented to person, place, and time.     Prenatal labs: ABO, Rh: --/--/A POS (01/18 1502) Antibody: NEG (01/18 1502) Rubella:  imm RPR: Non Reactive (04/27 1134)  HBsAg: Negative (04/27 1134)  HIV: Non Reactive (04/27 1134)  GBS:   unk  Assessment/Plan: This is a 39y0 G1P0 @  34 2/7 by 7wk TVUS NOT c/w LMP. PNC c/b CKD stg 3, CHTN on procardia and labetalol, BMI 47, recent COVID positive. Antenatal testing over this past month has been notable for oligohydramnios diagnosed today with total AFI 2.5cm and no SDP of 2. Additionally, BPP today for NRNST 4/8 with points off for breathing and fluid levels. AFI change is acute over this past week. Given poor BPP in setting of oligo hydramnios at >34wks s/p BMTZ, patient was counseled on increased risk of stillbirth given continued pregnancy. Counseled regarding options of inpatient monitoring for repeat NST and, if still NRNST would move towards delivery vs moving ahead with urgent PLTCS. Patient opts for the latter. NICU and anesthesia both aware  R/B/A of cesarean section discussed with patient. Alternative would be vaginal delivery which would mean shorter postpartum stay and decreased risk of bleeding. Risks of section include infection of the uterus, pelvic organs, or skin, inadvertent injury to internal organs, such as bowel or bladder. If there is major injury, extensive surgery may be required. If injury is minor, it may be treated with relative ease. Discussed possibility of excessive blood loss and transfusion. If bleeding cannot be controlled using medical or minor surgical methods, a cesarean hysterectomy may be performed which would mean no future fertility. Patient accepts the possibility of blood transfusion, if necessary. Patient understands and agrees to move forward with section.   Valerie Roys Rooney Swails 06/27/2020, 12:45 PM

## 2020-06-27 NOTE — Anesthesia Preprocedure Evaluation (Addendum)
Anesthesia Evaluation  Patient identified by MRN, date of birth, ID band Patient awake    Reviewed: Allergy & Precautions, NPO status , Patient's Chart, lab work & pertinent test results, reviewed documented beta blocker date and time   Airway Mallampati: III  TM Distance: >3 FB Neck ROM: Full    Dental  (+) Partial Upper   Pulmonary neg pulmonary ROS,    Pulmonary exam normal breath sounds clear to auscultation       Cardiovascular hypertension (chronic HTN), Pt. on medications and Pt. on home beta blockers Normal cardiovascular exam Rhythm:Regular Rate:Normal     Neuro/Psych  Headaches, negative psych ROS   GI/Hepatic negative GI ROS, Neg liver ROS,   Endo/Other  diabetes (pre-diabetic)Morbid obesityBMI 45  Renal/GU Renal Insufficiency and CRFRenal diseaseCKD 3, Cr 1.12  negative genitourinary   Musculoskeletal negative musculoskeletal ROS (+)   Abdominal (+) + obese,   Peds  Hematology hct 40.7, plt 168   Anesthesia Other Findings   Reproductive/Obstetrics (+) Pregnancy 34 2/7 weeks, low BPP and amniotic fluid in office today                             Anesthesia Physical Anesthesia Plan  ASA: III and emergent  Anesthesia Plan: Spinal   Post-op Pain Management:    Induction:   PONV Risk Score and Plan: 2 and Ondansetron, Dexamethasone and Treatment may vary due to age or medical condition  Airway Management Planned: Natural Airway and Nasal Cannula  Additional Equipment: None  Intra-op Plan:   Post-operative Plan:   Informed Consent: I have reviewed the patients History and Physical, chart, labs and discussed the procedure including the risks, benefits and alternatives for the proposed anesthesia with the patient or authorized representative who has indicated his/her understanding and acceptance.       Plan Discussed with: CRNA  Anesthesia Plan Comments:         Anesthesia Quick Evaluation

## 2020-06-27 NOTE — Op Note (Signed)
C-Section Operative Note  Date: 06/27/20  Preoperative Diagnosis: IUP @ 34 2/7, CKD stg 3, CHTN on Rx Postoperative Diagnosis: Same as above Procedure: PLTCS Indications: Worsening BPP 4/8 in setting of oligohydramnios with AFI 2.5cm Findings: Viable female infant weighing 1960 with APGARS of 8 and 9 at 1 and 5 minutes, respectively. Normal appearing uterus, bilateral fallopian tubes and ovaries. Specimens: pathology EBL 905 IVF 1300 UOP 150cc  Patient Course: This is a 39y0 G1P0 @ 34 2/7 by 7wk TVUS NOT c/w LMP. PNC c/b CKD stg 3, CHTN on procardia and labetalol, BMI 47, recent COVID positive. Antenatal testing over this past month has been notable for oligohydramnios diagnosed today with total AFI 2.5cm and no SDP of 2. Additionally, BPP today for NRNST 4/8 with points off for breathing and fluid levels. AFI change is acute over this past week. Given poor BPP in setting of oligo hydramnios at >34wks s/p BMTZ, patient was counseled on increased risk of stillbirth given continued pregnancy. Counseled regarding options of inpatient monitoring for repeat NST and, if still NRNST would move towards delivery vs moving ahead with urgent PLTCS. Patient opts for the latter. NICU and anesthesia both aware  Consent:  R/B/A of cesarean section discussed with patient. Alternative would be vaginal delivery which would mean shorter postpartum stay and decreased risk of bleeding. Risks of section include infection of the uterus, pelvic organs, or skin, inadvertent injury to internal organs, such as bowel or bladder. If there is major injury, extensive surgery may be required. If injury is minor, it may be treated with relative ease. Discussed possibility of excessive blood loss and transfusion. If bleeding cannot be controlled using medical or minor surgical methods, a cesarean hysterectomy may be performed which would mean no future fertility. Patient accepts the possibility of blood transfusion, if necessary.  Patient understands and agrees to move forward with section.   Operative Procedure: Patient was taken to the operating room where epidural anesthesia was found to be adequate by Allis clamp test. She was prepped including a TRAXI and draped in the normal sterile fashion in the dorsal supine position with a leftward tilt. 3g Ancef given preoperatively given BMI. An appropriate time out was performed. A Pfannenstiel skin incision was then made with the scalpel and carried through to the underlying layer of fascia by sharp dissection and Bovie cautery. The fascia was nicked in the midline and the incision was extended laterally with Mayo scissors. The superior aspect of the incision was grasped Coker clamps and dissected off the underlying rectus muscles. In a similar fashion the inferior aspect was dissected off the rectus muscles. Rectus muscles were separated in the midline and the peritoneal cavity entered bluntly. The peritoneal incision was then extended both superiorly and inferiorly with careful attention to avoid both bowel and bladder. The Alexis self-retaining wound retractor was then placed within the incision and the lower uterine segment exposed. The bladder flap was developed with Metzenbaum scissors and pushed away from the lower uterine segment. The lower uterine segment was then incised in a low transverse fashion and the cavity itself entered bluntly. Of note, venous sinus entered during hysterotomy which was easily made hemostatic during closure whoever this contributed to total EBL. The incision was extended bluntly. Amniotic sac was ruptured and fluid was noted to be scant but clear in color. The infant's head was then lifted and delivered from the incision without difficulty using the standard movements. The remainder of the infant delivered and the nose and  mouth bulb suctioned with the cord clamped and cut as well. The infant was handed off to NICU. The placenta was then spontaneously  expressed from the uterus and the uterus cleared of all clots and debris with moist lap sponge. The uterine incision was then repaired in 2 layers the first layer was a running locked layer of 0-vicryl and the second an imbricating layer of the same suture. Figure of eight placed midline near where previously mentioned sinus was located, excellent hemostasis noted afterwardsThe tubes and ovaries were inspected and the gutters cleared of all clots and debris. The uterine incision was inspected and found to be hemostatic. All instruments and sponges as well as the Alexis retractor were then removed from the abdomen. TThe rectus muscles were then reapproximated with several interrupted mattress sutures of 2-0 Vicryl. The fascia was then closed with 0 Vicryl in a running fashion. Subcutaneous tissue was reapproximated with 2-0 plain in a running fashion. The skin was closed with a subcuticular stitch of 4-0 Vicryl on a Keith needle and then reinforced with a Provena vacuum dressing. At the conclusion of the procedure all instruments and sponge counts were correct. Patient was taken to the recovery room in good condition with her baby on room air but heading to NICU given weight and gestational age

## 2020-06-28 LAB — CBC WITH DIFFERENTIAL/PLATELET
Abs Immature Granulocytes: 0.18 10*3/uL — ABNORMAL HIGH (ref 0.00–0.07)
Basophils Absolute: 0 10*3/uL (ref 0.0–0.1)
Basophils Relative: 0 %
Eosinophils Absolute: 0 10*3/uL (ref 0.0–0.5)
Eosinophils Relative: 0 %
HCT: 31.2 % — ABNORMAL LOW (ref 36.0–46.0)
Hemoglobin: 10.1 g/dL — ABNORMAL LOW (ref 12.0–15.0)
Immature Granulocytes: 1 %
Lymphocytes Relative: 10 %
Lymphs Abs: 2.1 10*3/uL (ref 0.7–4.0)
MCH: 30.8 pg (ref 26.0–34.0)
MCHC: 32.4 g/dL (ref 30.0–36.0)
MCV: 95.1 fL (ref 80.0–100.0)
Monocytes Absolute: 1.2 10*3/uL — ABNORMAL HIGH (ref 0.1–1.0)
Monocytes Relative: 6 %
Neutro Abs: 17.2 10*3/uL — ABNORMAL HIGH (ref 1.7–7.7)
Neutrophils Relative %: 83 %
Platelets: 149 10*3/uL — ABNORMAL LOW (ref 150–400)
RBC: 3.28 MIL/uL — ABNORMAL LOW (ref 3.87–5.11)
RDW: 14 % (ref 11.5–15.5)
WBC: 20.7 10*3/uL — ABNORMAL HIGH (ref 4.0–10.5)
nRBC: 0 % (ref 0.0–0.2)

## 2020-06-28 MED ORDER — ENOXAPARIN SODIUM 80 MG/0.8ML ~~LOC~~ SOLN
65.0000 mg | SUBCUTANEOUS | Status: DC
  Administered 2020-06-28 – 2020-06-29 (×2): 65 mg via SUBCUTANEOUS
  Filled 2020-06-28 (×2): qty 0.8

## 2020-06-28 MED ORDER — LABETALOL HCL 200 MG PO TABS
200.0000 mg | ORAL_TABLET | Freq: Two times a day (BID) | ORAL | Status: DC
  Administered 2020-06-28 – 2020-06-30 (×4): 200 mg via ORAL
  Filled 2020-06-28 (×3): qty 1

## 2020-06-28 NOTE — Progress Notes (Signed)
POSTPARTUM POSTOP PROGRESS NOTE  POD #1  Subjective:  No acute events overnight.  Pt denies problems with ambulating, voiding or po intake.  She denies nausea or vomiting.  Pain is well controlled.  She has had flatus. She has not had bowel movement.  Lochia Minimal. Baby boy is doing well in NICU. Reviewed with patient directions for hand expression/pumping for colostrum production and eventual milk for baby. Denies PreE symptoms.   Objective: Blood pressure 117/68, pulse 74, temperature 98 F (36.7 C), temperature source Oral, resp. rate 18, height 5\' 7"  (1.702 m), weight 131.5 kg, last menstrual period 10/06/2019, SpO2 100 %, unknown if currently breastfeeding.  Physical Exam:  General: alert, cooperative and no distress Lochia:normal flow Chest: CTAB Heart: RRR no m/r/g Abdomen: +BS, soft, nontender Uterine Fundus: firm, 2 cm below umbilicus. Provena dressing intact, suction in place, minimal drainage at this time Extremities: neg edema, neg calf TTP BL, neg Homans BL. SCDs in place  Recent Labs    06/27/20 1243 06/28/20 0724  HGB 13.4 10.1*  HCT 40.7 31.2*    Assessment/Plan:  ASSESSMENT: Donna Burns is a 40 y.o. G1P0101 s/p PLTCS @ [redacted]w[redacted]d for BPP 4/8 in setting of worsening oligohydramnios. PNC c/b CKD stg 3, CHTN on meds, BMI 47, recent COVID pos, AMA.   Breastfeeding and Lactation consult  CKD/CHTN on meds - Procardia 60XL QD plus Labetalol 400mg  BID continued, normotensive and asx this AM.     *Cr 1.12 postop. BMI 47, postop patient, meets qualifications for ppx Lovenox; SCDs on and in place. Will speak with pharmacy regarding dosing.   Anticipate DC home POD2-3   LOS: 1 day

## 2020-06-28 NOTE — Progress Notes (Signed)
Patient screened out for psychosocial assessment since none of the following apply: °Psychosocial stressors documented in mother or baby's chart °Gestation less than 32 weeks °Code at delivery  °Infant with anomalies °Please contact the Clinical Social Worker if specific needs arise, by MOB's request, or if MOB scores greater than 9/yes to question 10 on Edinburgh Postpartum Depression Screen. ° °Fallan Mccarey Boyd-Gilyard, MSW, LCSW °Clinical Social Work °[phone] °  °

## 2020-06-28 NOTE — Progress Notes (Signed)
BP review since 0900 117-134/77-86. Procardia 60XL given late as patient was upstairs visiting baby in NICU. Labetalol titrated to 200mg  BID, continue to monitor BP throughout stay; want to avoid under-perfusing kidneys.

## 2020-06-28 NOTE — Progress Notes (Signed)
ANTICOAGULATION CONSULT NOTE - Initial Consult  Pharmacy Consult for Lovenox Indication: VTE prophylaxis  Allergies  Allergen Reactions  . Pork-Derived Products Other (See Comments)    Pt does not eat pork or use pork-derived products   Dr. Reina Fuse spoke with patient and she's amenable to using lovenox which may have pork ingredient.  Patient Measurements: Height: 5\' 7"  (170.2 cm) Weight: 131.5 kg (290 lb) IBW/kg (Calculated) : 61.6 Heparin Dosing Weight:  131 kg  Vital Signs: Temp: 98.4 F (36.9 C) (02/02 1954) Temp Source: Oral (02/02 1954) BP: 122/77 (02/02 1954) Pulse Rate: 85 (02/02 1954)  Labs: Recent Labs    06/27/20 1243 06/28/20 0724  HGB 13.4 10.1*  HCT 40.7 31.2*  PLT 168 149*  CREATININE 1.12*  --     Estimated Creatinine Clearance: 94.4 mL/min (A) (by C-G formula based on SCr of 1.12 mg/dL (H)).   Medical History: Past Medical History:  Diagnosis Date  . Bilateral polycystic ovarian syndrome   . Chronic kidney disease   . HSV-2 (herpes simplex virus 2) infection   . Hypertension 2008  . Prediabetes   . Vaginal Pap smear, abnormal     Medications:  lovenox  Assessment: VTE prophylaxis    Plan:  Lovenox 65mg  (0.5mg /kg) sq qd  Hovey-Rankin, Simuel Stebner 06/28/2020,9:50 PM

## 2020-06-28 NOTE — Plan of Care (Signed)
  Problem: Activity: Goal: Risk for activity intolerance will decrease Outcome: Progressing   Problem: Nutrition: Goal: Adequate nutrition will be maintained Outcome: Progressing   Problem: Elimination: Goal: Will not experience complications related to urinary retention Outcome: Progressing   Problem: Education: Goal: Knowledge of condition will improve Outcome: Progressing   Problem: Skin Integrity: Goal: Demonstration of wound healing without infection will improve Outcome: Progressing

## 2020-06-28 NOTE — Progress Notes (Signed)
Spoke with Dr Kathrene Bongo of [facility] to inform them that patient had been delivered, reviewed current creatinine and vitals. UOP appropriate, reviewed recs for postop Lovenox ppx given BMI and postop status, OK per pharmacy to dose per GFR.

## 2020-06-28 NOTE — Lactation Note (Signed)
This note was copied from a baby's chart. Lactation Consultation Note  Patient Name: Donna Burns [id number] Date: 06/28/2020 Reason for consult: NICU baby;Follow-up assessment Age:41 hours   LC reviewed pumping and hand expression. Mom aware of cleaning and transporting protocol. Reviewed IDF. Will plan f/u visit.   Maternal Data Has patient been taught Hand Expression?: Yes Does the patient have breastfeeding experience prior to this delivery?: No  Feeding Mother's Current Feeding Choice: Breast Milk and Formula  Consult Status Consult Status: Follow-up Follow-up type: In-patient   [name], MA IBCLC 06/28/2020, 9:20 AM

## 2020-06-29 NOTE — Lactation Note (Signed)
This note was copied from a baby's chart. Lactation Consultation Note  Patient Name: Donna Burns STMHD'Q Date: 06/29/2020 Reason for consult: NICU baby;Follow-up assessment;Mother's request Age:41 hours  Mom pumping q 3 hours and transporting colostrum to NICU for her baby. She contacted [facility] today regarding appointment to pick up pump tomorrow q d/c. Patient was provided with the opportunity to ask questions. All concerns were addressed. Will plan follow up visit.    Consult Status Consult Status: Follow-up Follow-up type: In-patient  [name], MA IBCLC 06/29/2020, 3:31 PM

## 2020-06-29 NOTE — Progress Notes (Signed)
POD #2 LTCS  CHTN, CKD stage 3  Subjective: up ad lib, voiding and tolerating PO   Has noted some increased soreness   Objective: Blood pressure 139/74, pulse 91, temperature 97.9 F (36.6 C), temperature source Oral, resp. rate 18, height 5\' 7"  (1.702 m), weight 131.5 kg, last menstrual period 10/06/2019, SpO2 100 %, unknown if currently breastfeeding.  Physical Exam:  General: alert and cooperative Lochia: appropriate Uterine Fundus: firm Incision: vacuum dressing intact   Recent Labs    06/27/20 1243 06/28/20 0724  HGB 13.4 10.1*  HCT 40.7 31.2*    Assessment/Plan: Plan for discharge tomorrow, baby in NICU and working on pumping--desires circumcision when baby able BP stable on procardia XL 60mg  and labetalol 200mg  po BID   LOS: 2 days   08/26/20 06/29/2020, 9:13 AM

## 2020-06-30 LAB — SURGICAL PATHOLOGY

## 2020-06-30 MED ORDER — OXYCODONE HCL 5 MG PO TABS
5.0000 mg | ORAL_TABLET | ORAL | 0 refills | Status: DC | PRN
Start: 2020-06-30 — End: 2020-11-14

## 2020-06-30 MED ORDER — LABETALOL HCL 200 MG PO TABS: 200.0000 mg | ORAL_TABLET | Freq: Two times a day (BID) | ORAL | 1 refills | Status: DC

## 2020-06-30 NOTE — Discharge Instructions (Signed)
As per discharge pamphlet °

## 2020-06-30 NOTE — Discharge Summary (Signed)
Postpartum Discharge Summary      Patient Name: Donna Burns DOB: June 20, 1980 MRN: [id number]  Date of admission: 06/27/2020 Delivery date:06/27/2020  Delivering provider: Carlisle Cater  Date of discharge: 06/30/2020  Admitting diagnosis: [redacted] weeks gestation of pregnancy [Z3A.34] Pregnancy [Z34.90] Intrauterine pregnancy: [redacted]w[redacted]d     Secondary diagnosis:  Active Problems:   [redacted] weeks gestation of pregnancy   Pregnancy      Discharge diagnosis: Preterm Pregnancy Delivered, CHTN and ologohydramnios, 4/8 BPP, chronic kidney disease                                               Hospital course: Sceduled C/S   40 y.o. yo G1P0101 at [redacted]w[redacted]d was admitted to the hospital 06/27/2020 for scheduled cesarean section with the following indication:oligohydramnios, 4/8 BPP.Delivery details are as follows:  Membrane Rupture Time/Date: 2:19 PM ,06/27/2020   Delivery Method:C-Section, Low Transverse  Details of operation can be found in separate operative note.  Patient had an uncomplicated postpartum course.  She is ambulating, tolerating a regular diet, passing flatus, and urinating well. Patient is discharged home in stable condition on  06/30/20        Newborn Data: Birth date:06/27/2020  Birth time:2:19 PM  Gender:Female  Living status:Living  Apgars:8 ,9  Weight:1960 g     Physical exam  Vitals:   06/29/20 1510 06/29/20 1949 06/29/20 2323 06/30/20 0328  BP: 122/70 135/72 134/72 139/81  Pulse: 96 91 90 92  Resp: 19 18 18 20   Temp: 98.2 F (36.8 C) 98.1 F (36.7 C) 98.4 F (36.9 C) 98.6 F (37 C)  TempSrc: Oral Oral Oral Oral  SpO2: 100% 100% 100% 100%  Weight:      Height:       General: alert Lochia: appropriate Uterine Fundus: firm Incision: Dressing is clean, dry, and intact  Labs: Lab Results  Component Value Date   WBC 20.7 (H) 06/28/2020   HGB 10.1 (L) 06/28/2020   HCT 31.2 (L) 06/28/2020   MCV 95.1 06/28/2020   PLT 149 (L) 06/28/2020   CMP Latest Ref Rng & Units  06/27/2020  Glucose 70 - 99 mg/dL 86  BUN 6 - 20 mg/dL 11  Creatinine 08/25/2020 - 1.96 mg/dL 2.22)  Sodium 9.79(G - 921 mmol/L 136  Potassium 3.5 - 5.1 mmol/L 4.3  Chloride 98 - 111 mmol/L 107  CO2 22 - 32 mmol/L 20(L)  Calcium 8.9 - 10.3 mg/dL 194  Total Protein 6.5 - 8.1 g/dL -  Total Bilirubin 0.3 - 1.2 mg/dL -  Alkaline Phos 38 - 17.4 U/L -  AST 15 - 41 U/L -  ALT 0 - 44 U/L -   Edinburgh Score: Edinburgh Postnatal Depression Scale Screening Tool 06/28/2020  I have been able to laugh and see the funny side of things. 0  I have looked forward with enjoyment to things. 0  I have blamed myself unnecessarily when things went wrong. 1  I have been anxious or worried for no good reason. 1  I have felt scared or panicky for no good reason. 0  Things have been getting on top of me. 1  I have been so unhappy that I have had difficulty sleeping. 1  I have felt sad or miserable. 0  I have been so unhappy that I have been crying. 0  The thought of harming  myself has occurred to me. 0  Edinburgh Postnatal Depression Scale Total 4      After visit meds:  Allergies as of 06/30/2020      Reactions   Pork-derived Products Other (See Comments)   Pt does not eat pork or use pork-derived products      Medication List    STOP taking these medications   aspirin EC 81 MG tablet     TAKE these medications   acetaminophen 500 MG tablet Commonly known as: TYLENOL Take 1,000 mg by mouth every 6 (six) hours as needed for mild pain or headache.   labetalol 200 MG tablet Commonly known as: NORMODYNE Take 1 tablet (200 mg total) by mouth 2 (two) times daily. What changed:   how much to take  additional instructions   NIFEdipine 60 MG 24 hr tablet Commonly known as: ADALAT CC Take 60 mg by mouth daily.   oxyCODONE 5 MG immediate release tablet Commonly known as: Oxy IR/ROXICODONE Take 1-2 tablets (5-10 mg total) by mouth every 4 (four) hours as needed for severe pain.   prenatal  multivitamin Tabs tablet Take 1 tablet by mouth daily at 12 noon.        Discharge home in stable condition Infant Feeding: Breast Infant Disposition:NICU Discharge instruction: per After Visit Summary and Postpartum booklet. Activity: Advance as tolerated. Pelvic rest for 6 weeks.  Diet: low salt diet  Postpartum Appointment:4 days  Future Appointments: Future Appointments  Date Time Provider Department Center  07/07/2020  2:45 PM [facility] NURSE [facility]  07/07/2020  3:00 PM [facility] US1 [facility]   Follow up Visit:  Follow-up Information    Shivaji, Valerie Roys, MD. Schedule an appointment as soon as possible for a visit in 4 day(s).   Specialty: Obstetrics and Gynecology Why: for dressing removal Contact information: [address] [place] [address] [place] [address] [phone]                   06/30/2020 [name], MD

## 2020-06-30 NOTE — Progress Notes (Signed)
POD #3 LTCS Doing ok, oxycodone 10 mg worked well last pm for pain, she wants to go home today Afeb, VSS, BP normal Abd- soft, fundus firm, dressing intact D/c home today

## 2020-06-30 NOTE — Progress Notes (Signed)
Pt changed to home wound vac

## 2020-06-30 NOTE — Progress Notes (Signed)
Teaching complete  Pt to report to office for wound vac removal or if she has problems

## 2020-06-30 NOTE — Lactation Note (Signed)
This note was copied from a baby's chart. Lactation Consultation Note  Patient Name: Donna Burns [id number] Date: 06/30/2020 Reason for consult: Follow-up assessment;1st time breastfeeding;Primapara;NICU baby;Late-preterm 34-36.6wks;Maternal endocrine disorder Age:41 hours   LC in to visit with P1 Mom of LPTI in the NICU.  Mom has been consistently pumping every 3 hrs and obtaining small quantities to take to baby.   Mom states that [facility] is unable to provide her with a DEBP until Monday 2/7.  [facility] loaner given with instructions on how to return. Mom has been pumping one breast at a time.  Encouraged Mom to try to pump both breasts at the same time to elevate her prolactin hormone.   Pump parts were soaking in bin.  Reminded Mom not to soak parts for longer than 5 mins, and to disassemble all parts when washing, rinsing and air drying.  Encouraged Mom to do STS with baby as much as possible, and pump both breasts after.  Mom aware of DEBP in the NICU available to her.  Mom will request LC prn by asking NICU RN to call.     Lactation Tools Discussed/Used Tools: Pump;Flanges Flange Size: 27 Breast pump type: Double-Electric Breast Pump  Interventions Interventions: Breast feeding basics reviewed;Education;Skin to skin;Breast massage;Hand express;DEBP  Discharge Discharge Education: Engorgement and breast care  Consult Status Consult Status: Follow-up Date: 07/07/20 Follow-up type: In-patient    Judee Clara 06/30/2020, 10:36 AM

## 2020-07-06 ENCOUNTER — Ambulatory Visit: Payer: Self-pay

## 2020-07-06 NOTE — Lactation Note (Signed)
This note was copied from a baby's chart. Lactation Consultation Note  Patient Name: Donna Burns Date: 07/06/2020 Reason for consult: Other (Comment);Follow-up assessment;Primapara;1st time breastfeeding;Preterm <34wks;Infant < 77lbs (baby 46 days old( 35 4/7PMA ) - 34 2/7 GA - NICU RN Michiel Sites called for [facility] consult - for STS , and mom had questions.) Age:41 days  As LC arrived , baby already STS and being fed with the feeding tube.  Baby STS on moms chest  and being tube fed. LC held the baby near right nipple, and hand expressed a drop on baby's lips, baby licked the drop off his lower lip. Just working on Research officer, trade union, Event organiser. See below for discussion of milk supply.  Maternal Data    Feeding Mother's Current Feeding Choice: Breast Milk Nipple Type: Nfant Extra Slow Flow (gold)  LATCH Score                    Lactation Tools Discussed/Used Tools: Pump;Flanges Flange Size: 27 Breast pump type: Double-Electric Breast Pump Reason for Pumping: baby in NICU - per mom has been pumping x 5 in 24 hours and she mentioned that is an improvement since she is feeling better. Pumping frequency: 5 times both breast in 24 hours with WIC DEBP Pumped volume: 200 mL (per mom pumping range is 150 -200 ml per pumping)  LC praised mom for her pumping 5 x's in 24 hours and recommended working on increasing to 8 times / 24 hours. LC also discussed one of the 8 pumpings a day to make it a power pump over 60 mins - 15- 20 mins on 10 mins off over 60 mins )  . LC stressed the importance of protecting her milk supply.  Per mom has a DEBP - WIC pump at home and also is pumping at the hospital .   Interventions Interventions: Breast feeding basics reviewed;DEBP  Discharge Pump: DEBP WIC Program: Yes  Consult Status Consult Status: Follow-up Date: 07/07/20 Follow-up type: In-patient    Donna Burns 07/06/2020, 6:25 PM

## 2020-07-07 ENCOUNTER — Ambulatory Visit: Payer: Medicaid Other

## 2020-08-17 DIAGNOSIS — A609 Anogenital herpesviral infection, unspecified: Secondary | ICD-10-CM | POA: Diagnosis not present

## 2020-08-17 DIAGNOSIS — Z1389 Encounter for screening for other disorder: Secondary | ICD-10-CM | POA: Diagnosis not present

## 2020-08-17 DIAGNOSIS — Z3009 Encounter for other general counseling and advice on contraception: Secondary | ICD-10-CM | POA: Diagnosis not present

## 2020-08-21 DIAGNOSIS — M48061 Spinal stenosis, lumbar region without neurogenic claudication: Secondary | ICD-10-CM | POA: Insufficient documentation

## 2020-08-31 DIAGNOSIS — Z0271 Encounter for disability determination: Secondary | ICD-10-CM

## 2020-09-13 ENCOUNTER — Other Ambulatory Visit: Payer: Self-pay | Admitting: Internal Medicine

## 2020-09-13 DIAGNOSIS — Z1231 Encounter for screening mammogram for malignant neoplasm of breast: Secondary | ICD-10-CM

## 2020-09-15 DIAGNOSIS — N183 Chronic kidney disease, stage 3 unspecified: Secondary | ICD-10-CM | POA: Diagnosis not present

## 2020-10-04 DIAGNOSIS — N183 Chronic kidney disease, stage 3 unspecified: Secondary | ICD-10-CM | POA: Diagnosis not present

## 2020-10-04 DIAGNOSIS — O10011 Pre-existing essential hypertension complicating pregnancy, first trimester: Secondary | ICD-10-CM | POA: Diagnosis not present

## 2020-10-04 DIAGNOSIS — R350 Frequency of micturition: Secondary | ICD-10-CM | POA: Diagnosis not present

## 2020-10-04 DIAGNOSIS — N390 Urinary tract infection, site not specified: Secondary | ICD-10-CM | POA: Diagnosis not present

## 2020-10-04 DIAGNOSIS — I129 Hypertensive chronic kidney disease with stage 1 through stage 4 chronic kidney disease, or unspecified chronic kidney disease: Secondary | ICD-10-CM | POA: Diagnosis not present

## 2020-11-14 ENCOUNTER — Other Ambulatory Visit: Payer: Self-pay

## 2020-11-14 ENCOUNTER — Ambulatory Visit (INDEPENDENT_AMBULATORY_CARE_PROVIDER_SITE_OTHER): Payer: Medicaid Other | Admitting: Internal Medicine

## 2020-11-14 ENCOUNTER — Encounter: Payer: Self-pay | Admitting: Internal Medicine

## 2020-11-14 VITALS — BP 136/100 | HR 80 | Resp 20 | Ht 67.0 in | Wt 278.0 lb

## 2020-11-14 DIAGNOSIS — R609 Edema, unspecified: Secondary | ICD-10-CM

## 2020-11-14 DIAGNOSIS — N183 Chronic kidney disease, stage 3 unspecified: Secondary | ICD-10-CM

## 2020-11-14 DIAGNOSIS — A6000 Herpesviral infection of urogenital system, unspecified: Secondary | ICD-10-CM

## 2020-11-14 DIAGNOSIS — E282 Polycystic ovarian syndrome: Secondary | ICD-10-CM

## 2020-11-14 DIAGNOSIS — I10 Essential (primary) hypertension: Secondary | ICD-10-CM

## 2020-11-14 DIAGNOSIS — R600 Localized edema: Secondary | ICD-10-CM | POA: Insufficient documentation

## 2020-11-14 MED ORDER — NIFEDIPINE ER 90 MG PO TB24
ORAL_TABLET | ORAL | 11 refills | Status: DC
Start: 2020-11-14 — End: 2020-11-14

## 2020-11-14 MED ORDER — ACYCLOVIR 400 MG PO TABS
ORAL_TABLET | ORAL | 1 refills | Status: DC
Start: 2020-11-14 — End: 2021-08-20

## 2020-11-14 MED ORDER — LISINOPRIL 20 MG PO TABS: 20.0000 mg | ORAL_TABLET | Freq: Every day | ORAL | 11 refills | Status: DC

## 2020-11-14 NOTE — Progress Notes (Signed)
Subjective:    Patient ID: Donna Burns, female   DOB: [birth year], 40 y.o.   MRN: [id number]   HPI   C section at 34 weeks on 06/27/2020 and baby son doing well despite having some feeding and growing issues after birth.  [place].  2.  Hypertension/CKD:    diastolic generally the pressure that is up a bit.  Systolic can reach 140s at times.  Labetalol was decreased to half  dose following delivery--now 200 mg twice daily.  During pregnancy, her Nifedipine was increased as well to 60 mg daily.   Is followed by [facility], Dr. Marisue Humble, and discussing possibility of bariatric surgery for weight loss.  She feels she has eating behavior under control, that her PCOS is the continued issue with inability to lose weight.    3.  PCOS:  Has not been taking Metformin since became pregnant.  As she is not trying to get pregnant, would like to hold on restarting Metformin for now.  She generally loses weight with Metformin,but wants to see if between now and November when back to Nephrology she can lose weight without the Metformin.   Infrequent menses:  patient states she needs to have progesterone to be used as needed she goes more then 2 months without a period or she gets heavy periods.  Discussed her OB/Gyn should take care of that.   4.  History of genital herpes:  Has outbreaks about twice yearly when having periods.  With her PCOS, only 4 periods yearly.   She is not interested in suppression, but would like to have a prescription on hand for flare.      Current Meds  Medication Sig   acetaminophen (TYLENOL) 500 MG tablet Take 1,000 mg by mouth every 6 (six) hours as needed for mild pain or headache.   labetalol (NORMODYNE) 200 MG tablet Take 1 tablet (200 mg total) by mouth 2 (two) times daily.   NIFEdipine (ADALAT CC) 60 MG 24 hr tablet Take 60 mg by mouth daily.   Prenatal Vit-Fe Fumarate-FA (PRENATAL MULTIVITAMIN) TABS tablet Take 1 tablet by mouth daily at 12 noon.   Allergies   Allergen Reactions   Pork-Derived Products Other (See Comments)    Pt does not eat pork or use pork-derived products     Review of Systems    Objective:   BP (!) 136/100 (BP Location: Right Arm, Patient Position: Sitting, Cuff Size: Normal)   Pulse 80   Resp 20   Ht 5\' 7"  (1.702 m)   Wt 278 lb (126.1 kg)   LMP 11/02/2020   Breastfeeding No   BMI 43.54 kg/m   Physical Exam NAD HEENT:  PERRL, EOMI Neck:  supple, No adenopathy Chest:  CTA CV:  RRR with normal S1 and S2, No S3, S4 or murmur.  Radial and DP pulses normal and equal LE:  mild to moderate pitting edema to pretib area bilaterally.     Assessment & Plan    Hypertension/CKD:  would like to see this a bit lower.  As she no longer plans for pregnancy, will go ahead and place her back on Lisinopril 20 mg which she states controlled her BP well in the past.   She will follow up in 2 weeks for BP check and BMP.  If improved control without significant change in kidney function, will try to wean her Labetalol first and then the Nifedipine or perhaps switch the latter to Amlodipine.  2.  Obesity/PCOS:  Hold  on starting Metformin again.  Pt. Would like to work on this without the medication first.  She will contact her OB/Gyn about need for progesterone if she goes without a period (her concern--states she has been told she needs to do this to prevent uterine cancer) Discussed would hold on any major surgery such as bariatric surgery until her son is at the one year mark.  3.  Pitting edema of legs:  weight loss.  Limit sodium to 2000 mg daily.  Move when on feet and recline and elevate legs when sitting.  Consider compression stockings as well in the future.  4.  History of genital herpes, husband also with this:  she would prefer just to treat outbreaks.  Rx written with one refill.

## 2020-11-17 ENCOUNTER — Ambulatory Visit: Payer: Self-pay | Admitting: Internal Medicine

## 2020-12-07 ENCOUNTER — Ambulatory Visit: Payer: Medicaid Other | Admitting: Internal Medicine

## 2020-12-07 ENCOUNTER — Other Ambulatory Visit: Payer: Self-pay

## 2020-12-07 VITALS — BP 126/92 | HR 76

## 2020-12-07 DIAGNOSIS — Z79899 Other long term (current) drug therapy: Secondary | ICD-10-CM

## 2020-12-07 DIAGNOSIS — I10 Essential (primary) hypertension: Secondary | ICD-10-CM | POA: Diagnosis not present

## 2020-12-08 LAB — BASIC METABOLIC PANEL
BUN/Creatinine Ratio: 13 (ref 9–23)
BUN: 17 mg/dL (ref 6–24)
CO2: 25 mmol/L (ref 20–29)
Calcium: 10.1 mg/dL (ref 8.7–10.2)
Chloride: 104 mmol/L (ref 96–106)
Creatinine, Ser: 1.36 mg/dL — ABNORMAL HIGH (ref 0.57–1.00)
Glucose: 72 mg/dL (ref 65–99)
Potassium: 4.4 mmol/L (ref 3.5–5.2)
Sodium: 140 mmol/L (ref 134–144)
eGFR: 50 mL/min/{1.73_m2} — ABNORMAL LOW (ref >59)

## 2021-01-09 ENCOUNTER — Telehealth: Payer: Self-pay | Admitting: Internal Medicine

## 2021-01-09 NOTE — Telephone Encounter (Signed)
..   Medicaid Managed Care   Unsuccessful Outreach Note  01/09/2021 Name: Donna Burns MRN: [id number] DOB: 07-17-1980  Referred by: Julieanne Manson, MD Reason for referral : High Risk Managed Medicaid (I called this patient today to offer her a phone visit with the MM Team. I left a message with my name and number for her to return my call.)   An unsuccessful telephone outreach was attempted today. The patient was referred to the case management team for assistance with care management and care coordination.   Follow Up Plan: The care management team will reach out to the patient again over the next 7-14 days.   Weston Settle Care Guide, High Risk Medicaid Managed Care Embedded Care Coordination [facility]  [facility]

## 2021-02-28 ENCOUNTER — Other Ambulatory Visit: Payer: Self-pay

## 2021-02-28 ENCOUNTER — Ambulatory Visit (INDEPENDENT_AMBULATORY_CARE_PROVIDER_SITE_OTHER): Payer: Medicaid Other

## 2021-02-28 DIAGNOSIS — Z0130 Encounter for examination of blood pressure without abnormal findings: Secondary | ICD-10-CM

## 2021-02-28 NOTE — Progress Notes (Signed)
Patient confirmed that she is taking all her medications regularly without missing any days. She reported that she takes her bp medication at night, so she has not taken it since the night before 02/27/21. Does not have any questions or concerns regarding her medication.

## 2021-03-15 ENCOUNTER — Telehealth: Payer: Self-pay | Admitting: Physical Medicine and Rehabilitation

## 2021-03-15 NOTE — Telephone Encounter (Signed)
Patient returned call asked for a call back to set up an appointment with Dr Alvester Morin. The number to contact patient is [phone]

## 2021-03-26 ENCOUNTER — Telehealth: Payer: Self-pay | Admitting: Physical Medicine and Rehabilitation

## 2021-03-26 ENCOUNTER — Ambulatory Visit: Payer: Medicaid Other | Admitting: Physical Medicine and Rehabilitation

## 2021-03-26 NOTE — Telephone Encounter (Signed)
Patient called. She would like to RSC her appointment with Dr. Newton 

## 2021-03-26 NOTE — Telephone Encounter (Signed)
Called patient and rescheduled appointment

## 2021-03-28 ENCOUNTER — Ambulatory Visit (INDEPENDENT_AMBULATORY_CARE_PROVIDER_SITE_OTHER): Payer: Medicaid Other | Admitting: Physical Medicine and Rehabilitation

## 2021-03-28 ENCOUNTER — Other Ambulatory Visit: Payer: Self-pay

## 2021-03-28 ENCOUNTER — Encounter: Payer: Self-pay | Admitting: Physical Medicine and Rehabilitation

## 2021-03-28 VITALS — BP 135/85 | HR 86

## 2021-03-28 DIAGNOSIS — M47816 Spondylosis without myelopathy or radiculopathy, lumbar region: Secondary | ICD-10-CM | POA: Diagnosis not present

## 2021-03-28 DIAGNOSIS — M48062 Spinal stenosis, lumbar region with neurogenic claudication: Secondary | ICD-10-CM | POA: Diagnosis not present

## 2021-03-28 DIAGNOSIS — M4726 Other spondylosis with radiculopathy, lumbar region: Secondary | ICD-10-CM | POA: Diagnosis not present

## 2021-03-28 NOTE — Progress Notes (Signed)
Donna Burns - 40 y.o. female MRN [id number]  Date of birth: May 24, 1981  Office Visit Note: Visit Date: 03/28/2021 PCP: Julieanne Manson, MD Referred by: Julieanne Manson, MD  Subjective: Chief Complaint  Patient presents with   Lower Back - Other, Pain   Left Thigh - Pain   Right Thigh - Pain   Right Hip - Pain   Left Hip - Pain   Right Knee - Pain   Left Knee - Pain   HPI: Donna Burns is a 40 y.o. female who comes in today For evaluation of chronic, worsening and severe bilateral lower back pain radiating to hips and down legs. Patient reports pain has been ongoing for several years. Patient reports pain is exacerbated by moving from a sitting to standing position and prolonged walking. Patient describes pain as throbbing and sharp sensation, currently rates as 8 out of 10. Patient report some relief of pain with home exercises, rest and Tylenol. Patient's lumbar MRI from 2021 exhibits advanced multi-factorial spinal canal stenosis at L4-L5. Patient had left L5-S1 interlaminar epidural steroid injection by Dr. Tyrell Antonio in 2021, she reports this did given her significant and sustained pain relief until the last several months. Patient did attend formal physical therapy at [facility] in 2021, which she reports did help to alleviate pain. Patient reports she had high risk pregnancy and cesarean section this past February and is now dealing with chronic kidney disease and pre-diabetes. Patient states she is staying active and has a weight loss plan in place, however she is having difficulty exercising at this time due to increased pain. 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 Patient denies focal weakness, numbness and tingling. Patient denies recent trauma or falls.   Review of Systems  Musculoskeletal:  Positive for  back pain.  Neurological:  Positive for tingling and sensory change. Negative for focal weakness and weakness.  All other systems reviewed and are negative. Otherwise per HPI.  Assessment & Plan: Visit Diagnoses:    ICD-10-CM   1. Other spondylosis with radiculopathy, lumbar region  M47.26 Ambulatory referral to Physical Medicine Rehab    2. Spinal stenosis of lumbar region with neurogenic claudication  M48.062     3. Facet arthropathy, lumbar  M47.816        Plan: Findings:  Chronic, worsening and severe bilateral lower back pain radiating to hips and down legs. Patient continues to have excruciating and debilitating pain despite good conservative therapies such as formal physical therapy, home exercise program and use of medications. Patient's clinical presentation and exam are consistent with neurogenic claudication from spinal canal stenosis as she does report tingling/numbness and burning sensation to legs. She does have one area of advanced spinal canal stenosis at L4-L5 on recent lumbar MRI. We feel the next step is to perform a diagnostic and hopefully therapeutic bilateral L4 transforaminal epidural steroid injection under fluoroscopic guidance. If patient does well with this epidural injection we will continue to monitor, however if her pain returns or worsens we would consider surgical consultation. We feel that patient would be a good candidate for lumbar decompression. Patient's sister had lumbar decompression by Dr. Hoyt Koch at [facility], we are happy to place a referral to this provider per patient request when warranted. No red flag symptoms noted upon exam today.    Meds &  Orders: No orders of the defined types were placed in this encounter.   Orders Placed This Encounter  Procedures   Ambulatory referral to Physical Medicine Rehab    Follow-up: Return in about 1 week (around 04/04/2021) for Bilateral L4 transforaminal epidural steroid injection.    Procedures: No procedures performed      Clinical History: MRI LUMBAR SPINE WITHOUT CONTRAST   TECHNIQUE: Multiplanar, multisequence MR imaging of the lumbar spine was performed. No intravenous contrast was administered.   COMPARISON:  None.   FINDINGS: Segmentation:  Lumbar radiculopathy.   Alignment:  Mild anterolisthesis at L4-5   Vertebrae:  No fracture, evidence of discitis, or bone lesion.   Conus medullaris and cauda equina: Conus extends to the L1 level. Conus appears normal. There is nerve root redundancy below the advanced spinal stenosis at L4-5.   Paraspinal and other soft tissues: Negative   Disc levels:   L4-5 facet degeneration with hypertrophy and anterolisthesis. The disc is narrowed and bulging and there is a right eccentric protrusion extending to the level of the foramen. Spinal stenosis is advanced. The lower lumbar canal is narrow at baseline due to short pedicles.   IMPRESSION: L4-5 advanced spinal stenosis due to short pedicles, disc protrusion, and facet osteoarthritis with anterolisthesis.     Electronically Signed   By: Monte Fantasia M.D.   On: 08/13/2019 06:24   She reports that she has never smoked. She has never used smokeless tobacco. No results for input(s): HGBA1C, LABURIC in the last 8760 hours.  Objective:  VS:  HT:    WT:   BMI:     BP:135/85  HR:86bpm  TEMP: ( )  RESP:  Physical Exam Vitals and nursing note reviewed.  HENT:     Head: Normocephalic and atraumatic.     Right Ear: External ear normal.     Left Ear: External ear normal.     Nose: Nose normal.     Mouth/Throat:     Mouth: Mucous membranes are moist.  Eyes:     Extraocular Movements: Extraocular movements intact.  Cardiovascular:     Rate and Rhythm: Normal rate.     Pulses: Normal pulses.  Pulmonary:     Effort: Pulmonary effort is normal.  Abdominal:     General: Abdomen is flat.  Musculoskeletal:        General: Tenderness present.      Cervical back: Normal range of motion.     Comments: Pt is slow to rise from seated position to standing. Good lumbar range of motion. Strong distal strength without clonus, no pain upon palpation of greater trochanters. Sensation intact bilaterally. Dysesthesias/paraesthesias noted to L4 dermatome bilaterally. Walks independently, gait steady.     Skin:    General: Skin is warm and dry.     Capillary Refill: Capillary refill takes less than 2 seconds.  Neurological:     General: No focal deficit present.     Mental Status: She is alert.  Psychiatric:        Mood and Affect: Mood normal.    Ortho Exam  Imaging: No results found.  Past Medical/Family/Surgical/Social History: Medications & Allergies reviewed per EMR, new medications updated. Patient Active Problem List   Diagnosis Date Noted   Peripheral edema 11/14/2020   Genital herpes simplex 11/14/2020   [redacted] weeks gestation of pregnancy 06/27/2020   Pregnancy 06/27/2020   Non-reassuring electronic fetal monitoring tracing 06/13/2020   Encounter for supervision of high risk pregnancy due to advanced maternal  age in primigravida 11/26/2019   Chronic bilateral low back pain with left-sided sciatica 09/18/2019   Eczema 09/18/2019   Stage 1 chronic kidney disease 02/26/2019   Tinea pedis of both feet 02/26/2019   Tinea corporis 02/26/2019   Prediabetes    VAGINAL DISCHARGE 04/13/2010   ANEMIA 10/27/2008   VAGINITIS, BACTERIAL 10/27/2008   Essential hypertension 06/21/2008   LEG CRAMPS 06/21/2008   ENDOMETRIAL HYPERPLASIA UNSPECIFIED 01/18/2008   Morbid obesity ([place]) 10/02/2007   ECZEMA 10/02/2007   CLUSTER HEADACHE 08/19/2007   DENTAL CARIES 08/19/2007   PCOS (polycystic ovarian syndrome) 05/27/1997   Past Medical History:  Diagnosis Date   Bilateral polycystic ovarian syndrome    Chronic kidney disease    HSV-2 (herpes simplex virus 2) infection    Hypertension 2008   Prediabetes    Vaginal Pap smear, abnormal     Family History  Problem Relation Age of Onset   Heart failure Mother    Diabetes Mother    Hypertension Mother    Breast cancer Mother    Stroke Father        Nursing home bound   Hypertension Father    Dementia Father        from strokes   Other Sister        Prediabetes   Hypertension Sister    Past Surgical History:  Procedure Laterality Date   CESAREAN SECTION N/A 06/27/2020   Procedure: CESAREAN SECTION;  Surgeon: Deliah Boston, MD;  Location: [facility] LD ORS;  Service: Obstetrics;  Laterality: N/A;   KNEE SURGERY Right 2015   torn cartilage--arthroscopic   MOUTH SURGERY  2016   9 teeth pulled for decay-upper incisors.   Social History   Occupational History   Not on file  Tobacco Use   Smoking status: Never   Smokeless tobacco: Never  Vaping Use   Vaping Use: Never used  Substance and Sexual Activity   Alcohol use: Never   Drug use: Never   Sexual activity: Yes    Birth control/protection: None

## 2021-03-28 NOTE — Progress Notes (Signed)
Pt state lower back pain that travels to both hips, thighs and knees pain. Pt state sitting, walking and standing makes the pain worse. Pt state she takes over the counter pain meds to help ease her pain.  Numeric Pain Rating Scale and Functional Assessment Average Pain 10 Pain Right Now 8 My pain is intermittent, constant, sharp, and aching Pain is worse with: walking, standing, and some activites Pain improves with: medication   In the last MONTH (on 0-10 scale) has pain interfered with the following?  1. General activity like being  able to carry out your everyday physical activities such as walking, climbing stairs, carrying groceries, or moving a chair?  Rating(7)  2. Relation with others like being able to carry out your usual social activities and roles such as  activities at home, at work and in your community. Rating(8)  3. Enjoyment of life such that you have  been bothered by emotional problems such as feeling anxious, depressed or irritable?  Rating(9)

## 2021-03-29 DIAGNOSIS — N183 Chronic kidney disease, stage 3 unspecified: Secondary | ICD-10-CM | POA: Diagnosis not present

## 2021-04-03 DIAGNOSIS — N183 Chronic kidney disease, stage 3 unspecified: Secondary | ICD-10-CM | POA: Diagnosis not present

## 2021-04-03 DIAGNOSIS — I129 Hypertensive chronic kidney disease with stage 1 through stage 4 chronic kidney disease, or unspecified chronic kidney disease: Secondary | ICD-10-CM | POA: Diagnosis not present

## 2021-04-30 ENCOUNTER — Ambulatory Visit (INDEPENDENT_AMBULATORY_CARE_PROVIDER_SITE_OTHER): Payer: Medicaid Other | Admitting: Physical Medicine and Rehabilitation

## 2021-04-30 ENCOUNTER — Ambulatory Visit: Payer: Self-pay

## 2021-04-30 ENCOUNTER — Other Ambulatory Visit: Payer: Self-pay

## 2021-04-30 ENCOUNTER — Encounter: Payer: Self-pay | Admitting: Physical Medicine and Rehabilitation

## 2021-04-30 VITALS — BP 133/89 | HR 74

## 2021-04-30 DIAGNOSIS — M48062 Spinal stenosis, lumbar region with neurogenic claudication: Secondary | ICD-10-CM | POA: Diagnosis not present

## 2021-04-30 MED ORDER — METHYLPREDNISOLONE ACETATE 80 MG/ML IJ SUSP
80.0000 mg | Freq: Once | INTRAMUSCULAR | Status: AC
  Administered 2021-04-30: 16:00:00 80 mg

## 2021-04-30 NOTE — Patient Instructions (Signed)

## 2021-04-30 NOTE — Progress Notes (Signed)
Numeric Pain Rating Scale and Functional Assessment Average Pain 9   In the last MONTH (on 0-10 scale) has pain interfered with the following?  1. General activity like being  able to carry out your everyday physical activities such as walking, climbing stairs, carrying groceries, or moving a chair?  Rating(10)   +Driver, -BT, -Dye Allergies.   Patient is here to get injection in her back. She is a Runner, broadcasting/film/video and walks a lot. Hurts to sit, stand and walk  for long. Takes Tylenol for pain. Pain radiates down legs.

## 2021-05-13 NOTE — Progress Notes (Signed)
Donna Burns - 40 y.o. female MRN [id number]  Date of birth: Nov 17, 1980  Office Visit Note: Visit Date: 04/30/2021 PCP: Julieanne Manson, MD Referred by: Julieanne Manson, MD  Subjective: Chief Complaint  Patient presents with   Lower Back - Pain   HPI:  Donna Burns is a 40 y.o. female who comes in today at the request of Ellin Goodie, FNP for planned Bilateral L4-5 Lumbar Transforaminal epidural steroid injection with fluoroscopic guidance.  The patient has failed conservative care including home exercise, medications, time and activity modification.  This injection will be diagnostic and hopefully therapeutic.  Please see requesting physician notes for further details and justification.  ROS Otherwise per HPI.  Assessment & Plan: Visit Diagnoses:    ICD-10-CM   1. Spinal stenosis of lumbar region with neurogenic claudication  M48.062 XR C-ARM NO REPORT    Epidural Steroid injection    methylPREDNISolone acetate (DEPO-MEDROL) injection 80 mg      Plan: No additional findings.   Meds & Orders:  Meds ordered this encounter  Medications   methylPREDNISolone acetate (DEPO-MEDROL) injection 80 mg    Orders Placed This Encounter  Procedures   XR C-ARM NO REPORT   Epidural Steroid injection    Follow-up: Return if symptoms worsen or fail to improve.   Procedures: No procedures performed  Lumbosacral Transforaminal Epidural Steroid Injection - Sub-Pedicular Approach with Fluoroscopic Guidance  Patient: Donna Burns      Date of Birth: 13-May-1981 MRN: [id number] PCP: Julieanne Manson, MD      Visit Date: 04/30/2021   Universal Protocol:    Date/Time: 04/30/2021  Consent Given By: the patient  Position: PRONE  Additional Comments: Vital signs were monitored before and after the procedure. Patient was prepped and draped in the usual sterile fashion. The correct patient, procedure, and site was verified.   Injection Procedure Details:    Procedure diagnoses: Spinal stenosis of lumbar region with neurogenic claudication [M48.062]    Meds Administered:  Meds ordered this encounter  Medications   methylPREDNISolone acetate (DEPO-MEDROL) injection 80 mg    Laterality: Bilateral  Location/Site: L4  Needle:5.0 in., 22 ga.  Short bevel or Quincke spinal needle  Needle Placement: Transforaminal  Findings:    -Comments: Excellent flow of contrast along the nerve, nerve root and into the epidural space.  Procedure Details: After squaring off the end-plates to get a true AP view, the C-arm was positioned so that an oblique view of the foramen as noted above was visualized. The target area is just inferior to the "nose of the scotty dog" or sub pedicular. The soft tissues overlying this structure were infiltrated with 2-3 ml. of 1% Lidocaine without Epinephrine.  The spinal needle was inserted toward the target using a "trajectory" view along the fluoroscope beam.  Under AP and lateral visualization, the needle was advanced so it did not puncture dura and was located close the 6 O'Clock position of the pedical in AP tracterory. Biplanar projections were used to confirm position. Aspiration was confirmed to be negative for CSF and/or blood. A 1-2 ml. volume of Isovue-250 was injected and flow of contrast was noted at each level. Radiographs were obtained for documentation purposes.   After attaining the desired flow of contrast documented above, a 0.5 to 1.0 ml test dose of 0.25% Marcaine was injected into each respective transforaminal space.  The patient was observed for 90 seconds post injection.  After no sensory deficits were reported, and normal lower extremity motor function was noted,  the above injectate was administered so that equal amounts of the injectate were placed at each foramen (level) into the transforaminal epidural space.   Additional Comments:  The patient tolerated the procedure well Dressing: 2 x 2  sterile gauze and Band-Aid    Post-procedure details: Patient was observed during the procedure. Post-procedure instructions were reviewed.  Patient left the clinic in stable condition.    Clinical History: MRI LUMBAR SPINE WITHOUT CONTRAST   TECHNIQUE: Multiplanar, multisequence MR imaging of the lumbar spine was performed. No intravenous contrast was administered.   COMPARISON:  None.   FINDINGS: Segmentation:  Lumbar radiculopathy.   Alignment:  Mild anterolisthesis at L4-5   Vertebrae:  No fracture, evidence of discitis, or bone lesion.   Conus medullaris and cauda equina: Conus extends to the L1 level. Conus appears normal. There is nerve root redundancy below the advanced spinal stenosis at L4-5.   Paraspinal and other soft tissues: Negative   Disc levels:   L4-5 facet degeneration with hypertrophy and anterolisthesis. The disc is narrowed and bulging and there is a right eccentric protrusion extending to the level of the foramen. Spinal stenosis is advanced. The lower lumbar canal is narrow at baseline due to short pedicles.   IMPRESSION: L4-5 advanced spinal stenosis due to short pedicles, disc protrusion, and facet osteoarthritis with anterolisthesis.     Electronically Signed   By: Marnee Spring M.D.   On: 08/13/2019 06:24     Objective:  VS:  HT:     WT:    BMI:      BP:133/89   HR:74bpm   TEMP: ( )   RESP:  Physical Exam Vitals and nursing note reviewed.  Constitutional:      General: She is not in acute distress.    Appearance: Normal appearance. She is obese. She is not ill-appearing.  HENT:     Head: Normocephalic and atraumatic.     Right Ear: External ear normal.     Left Ear: External ear normal.  Eyes:     Extraocular Movements: Extraocular movements intact.  Cardiovascular:     Rate and Rhythm: Normal rate.     Pulses: Normal pulses.  Pulmonary:     Effort: Pulmonary effort is normal. No respiratory distress.  Abdominal:      General: There is no distension.     Palpations: Abdomen is soft.  Musculoskeletal:        General: Tenderness present.     Cervical back: Neck supple.     Right lower leg: No edema.     Left lower leg: No edema.     Comments: Patient has good distal strength with no pain over the greater trochanters.  No clonus or focal weakness.  Skin:    Findings: No erythema, lesion or rash.  Neurological:     General: No focal deficit present.     Mental Status: She is alert and oriented to person, place, and time.     Sensory: No sensory deficit.     Motor: No weakness or abnormal muscle tone.     Coordination: Coordination normal.  Psychiatric:        Mood and Affect: Mood normal.        Behavior: Behavior normal.     Imaging: No results found.

## 2021-05-13 NOTE — Procedures (Signed)
Lumbosacral Transforaminal Epidural Steroid Injection - Sub-Pedicular Approach with Fluoroscopic Guidance  Patient: Donna Burns      Date of Birth: 1980/11/19 MRN: [id number] PCP: Julieanne Manson, MD      Visit Date: 04/30/2021   Universal Protocol:    Date/Time: 04/30/2021  Consent Given By: the patient  Position: PRONE  Additional Comments: Vital signs were monitored before and after the procedure. Patient was prepped and draped in the usual sterile fashion. The correct patient, procedure, and site was verified.   Injection Procedure Details:   Procedure diagnoses: Spinal stenosis of lumbar region with neurogenic claudication [M48.062]    Meds Administered:  Meds ordered this encounter  Medications   methylPREDNISolone acetate (DEPO-MEDROL) injection 80 mg    Laterality: Bilateral  Location/Site: L4  Needle:5.0 in., 22 ga.  Short bevel or Quincke spinal needle  Needle Placement: Transforaminal  Findings:    -Comments: Excellent flow of contrast along the nerve, nerve root and into the epidural space.  Procedure Details: After squaring off the end-plates to get a true AP view, the C-arm was positioned so that an oblique view of the foramen as noted above was visualized. The target area is just inferior to the "nose of the scotty dog" or sub pedicular. The soft tissues overlying this structure were infiltrated with 2-3 ml. of 1% Lidocaine without Epinephrine.  The spinal needle was inserted toward the target using a "trajectory" view along the fluoroscope beam.  Under AP and lateral visualization, the needle was advanced so it did not puncture dura and was located close the 6 O'Clock position of the pedical in AP tracterory. Biplanar projections were used to confirm position. Aspiration was confirmed to be negative for CSF and/or blood. A 1-2 ml. volume of Isovue-250 was injected and flow of contrast was noted at each level. Radiographs were obtained for  documentation purposes.   After attaining the desired flow of contrast documented above, a 0.5 to 1.0 ml test dose of 0.25% Marcaine was injected into each respective transforaminal space.  The patient was observed for 90 seconds post injection.  After no sensory deficits were reported, and normal lower extremity motor function was noted,   the above injectate was administered so that equal amounts of the injectate were placed at each foramen (level) into the transforaminal epidural space.   Additional Comments:  The patient tolerated the procedure well Dressing: 2 x 2 sterile gauze and Band-Aid    Post-procedure details: Patient was observed during the procedure. Post-procedure instructions were reviewed.  Patient left the clinic in stable condition.

## 2021-05-16 ENCOUNTER — Ambulatory Visit: Payer: Self-pay | Admitting: Internal Medicine

## 2021-05-18 ENCOUNTER — Telehealth: Payer: Self-pay | Admitting: Physical Medicine and Rehabilitation

## 2021-05-18 NOTE — Telephone Encounter (Signed)
Patient needs to make a follow up appt. She received an injection and has been feeling worse. Please advise.

## 2021-05-30 ENCOUNTER — Telehealth: Payer: Self-pay

## 2021-05-30 DIAGNOSIS — M48062 Spinal stenosis, lumbar region with neurogenic claudication: Secondary | ICD-10-CM

## 2021-05-30 DIAGNOSIS — M5416 Radiculopathy, lumbar region: Secondary | ICD-10-CM

## 2021-05-30 NOTE — Telephone Encounter (Signed)
Pt received an injection on 04/30/21 and has been feeling worse. Pt state she has gotten any relief from the inj and her left side hurts the worse. Please advise.

## 2021-05-31 NOTE — Telephone Encounter (Signed)
Scheduled for 06/28/2021

## 2021-06-05 ENCOUNTER — Other Ambulatory Visit: Payer: Self-pay | Admitting: Physical Medicine and Rehabilitation

## 2021-06-05 ENCOUNTER — Telehealth: Payer: Self-pay | Admitting: Physical Medicine and Rehabilitation

## 2021-06-05 MED ORDER — MELOXICAM 15 MG PO TABS: 15.0000 mg | ORAL_TABLET | Freq: Every day | ORAL | 0 refills | Status: DC

## 2021-06-05 NOTE — Progress Notes (Signed)
Meloxicam rx until appointment

## 2021-06-05 NOTE — Telephone Encounter (Signed)
Patient called needing Rx refilled Meloxicam until her appointment.  Ph# [phone]

## 2021-06-14 ENCOUNTER — Ambulatory Visit (INDEPENDENT_AMBULATORY_CARE_PROVIDER_SITE_OTHER): Payer: Medicaid Other | Admitting: Internal Medicine

## 2021-06-14 ENCOUNTER — Encounter: Payer: Self-pay | Admitting: Internal Medicine

## 2021-06-14 ENCOUNTER — Other Ambulatory Visit: Payer: Self-pay

## 2021-06-14 VITALS — BP 150/108 | HR 80 | Resp 16 | Ht 67.0 in | Wt 274.0 lb

## 2021-06-14 DIAGNOSIS — L989 Disorder of the skin and subcutaneous tissue, unspecified: Secondary | ICD-10-CM

## 2021-06-14 DIAGNOSIS — E282 Polycystic ovarian syndrome: Secondary | ICD-10-CM | POA: Diagnosis not present

## 2021-06-14 DIAGNOSIS — I10 Essential (primary) hypertension: Secondary | ICD-10-CM

## 2021-06-14 DIAGNOSIS — N183 Chronic kidney disease, stage 3 unspecified: Secondary | ICD-10-CM | POA: Diagnosis not present

## 2021-06-14 DIAGNOSIS — Z569 Unspecified problems related to employment: Secondary | ICD-10-CM | POA: Diagnosis not present

## 2021-06-14 NOTE — Patient Instructions (Signed)
[facility]  Address: [address], [place], [place] [address]  Phone: [phone] Please walk into the clinic between 9 a.m. and 1 p.m. Mon-Fri to establish Virtual visits are available 
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
## 2021-06-14 NOTE — Progress Notes (Deleted)
° ° °  Subjective:    Patient ID: Donna Burns, female   DOB: Jun 30, 1980, 41 y.o.   MRN: [id number]   HPI  No outpatient medications have been marked as taking for the 06/14/21 encounter (Office Visit) with Julieanne Manson, MD.   Allergies  Allergen Reactions   Pork-Derived Products Other (See Comments)    Pt does not eat pork or use pork-derived products     Review of Systems    Objective:   There were no vitals taken for this visit.  Physical Exam   Assessment & Plan   ***

## 2021-06-19 DIAGNOSIS — F430 Acute stress reaction: Secondary | ICD-10-CM | POA: Diagnosis not present

## 2021-06-28 ENCOUNTER — Other Ambulatory Visit: Payer: Self-pay

## 2021-06-28 ENCOUNTER — Ambulatory Visit: Payer: Self-pay

## 2021-06-28 ENCOUNTER — Encounter: Payer: Self-pay | Admitting: Specialist

## 2021-06-28 ENCOUNTER — Ambulatory Visit (INDEPENDENT_AMBULATORY_CARE_PROVIDER_SITE_OTHER): Payer: Medicaid Other | Admitting: Specialist

## 2021-06-28 VITALS — BP 126/87 | HR 69 | Ht 67.0 in | Wt 274.0 lb

## 2021-06-28 DIAGNOSIS — M4316 Spondylolisthesis, lumbar region: Secondary | ICD-10-CM

## 2021-06-28 DIAGNOSIS — M5136 Other intervertebral disc degeneration, lumbar region: Secondary | ICD-10-CM

## 2021-06-28 DIAGNOSIS — M5416 Radiculopathy, lumbar region: Secondary | ICD-10-CM

## 2021-06-28 MED ORDER — TRAMADOL-ACETAMINOPHEN 37.5-325 MG PO TABS
1.0000 | ORAL_TABLET | Freq: Four times a day (QID) | ORAL | 0 refills | Status: DC | PRN
Start: 2021-06-28 — End: 2021-07-26

## 2021-06-28 NOTE — Progress Notes (Signed)
Office Visit Note   Patient: Donna Burns           Date of Birth: [birth year]           MRN: [id number] Visit Date: 06/28/2021              Requested by: Magnus Sinning, [address] [place],  [place] [address] PCP: Mack Hook, MD   Assessment & Plan: Visit Diagnoses:  1. Radiculopathy, lumbar region   2. Spondylolisthesis, lumbar region   3. Degenerative disc disease, lumbar     Plan: Avoid bending, stooping and avoid lifting weights greater than 10 lbs. Avoid prolong standing and walking. Avoid frequent bending and stooping  No lifting greater than 10 lbs. May use ice or moist heat for pain. Weight loss is of benefit. MRI of the lumbar spine is ordered due to L5 weakness left greater than right with worsening spondylolisthesis L4-5 to grade 2.   Follow-Up Instructions: Return in about 4 weeks (around 07/26/2021).   Orders:  Orders Placed This Encounter  Procedures   XR Lumbar Spine 2-3 Views   MR Lumbar Spine w/o contrast   No orders of the defined types were placed in this encounter.     Procedures: No procedures performed   Clinical Data: No additional findings.   Subjective: Chief Complaint  Patient presents with   Lower Back - Pain    41 year old female with history of back pain with radiation into the right leg. She was working and felt right leg numbness and weakness the problem seemed to worsen, right before COVID pandemic started 2019. No bowel or bladder difficulty. She has pain with prolong standing, walking and sitting. Has pain with trying to sleep, hard to get into a position that comfortable.She is able to walk a mile but relates that the after may be out for a day. She has pain with walking up stairs. The back pain is worse than the leg but on other occasions the leg pain is greater. She relates that she  Doesn't feel like herself and has pain wlking up stairs.   Review of Systems  Constitutional: Negative.   HENT: Negative.     Eyes: Negative.   Respiratory: Negative.    Cardiovascular: Negative.   Gastrointestinal: Negative.   Endocrine: Negative.   Genitourinary: Negative.   Musculoskeletal: Negative.   Skin: Negative.   Allergic/Immunologic: Negative.   Neurological: Negative.   Hematological: Negative.   Psychiatric/Behavioral: Negative.      Objective: Vital Signs: BP 126/87 (BP Location: Left Arm, Patient Position: Sitting)    Pulse 69    Ht 5\' 7"  (1.702 m)    Wt 274 lb (124.3 kg)    LMP 06/12/2021 (Within Days)    BMI 42.91 kg/m   Physical Exam Constitutional:      Appearance: She is well-developed.  HENT:     Head: Normocephalic and atraumatic.  Eyes:     Pupils: Pupils are equal, round, and reactive to light.  Pulmonary:     Effort: Pulmonary effort is normal.     Breath sounds: Normal breath sounds.  Abdominal:     General: Bowel sounds are normal.     Palpations: Abdomen is soft.  Musculoskeletal:        General: Normal range of motion.     Cervical back: Normal range of motion and neck supple.  Skin:    General: Skin is warm and dry.  Neurological:     Mental Status: She  is alert and oriented to person, place, and time.  Psychiatric:        Behavior: Behavior normal.        Thought Content: Thought content normal.        Judgment: Judgment normal.    Ortho Exam  Specialty Comments:  No specialty comments available.  Imaging: No results found.   PMFS History: Patient Active Problem List   Diagnosis Date Noted   Peripheral edema 11/14/2020   Genital herpes simplex 11/14/2020   [redacted] weeks gestation of pregnancy 06/27/2020   Pregnancy 06/27/2020   Non-reassuring electronic fetal monitoring tracing 06/13/2020   Encounter for supervision of high risk pregnancy due to advanced maternal age in primigravida 11/26/2019   Chronic bilateral low back pain with left-sided sciatica 09/18/2019   Eczema 09/18/2019   Stage 1 chronic kidney disease 02/26/2019   Tinea pedis of both  feet 02/26/2019   Tinea corporis 02/26/2019   Prediabetes    VAGINAL DISCHARGE 04/13/2010   ANEMIA 10/27/2008   VAGINITIS, BACTERIAL 10/27/2008   Essential hypertension 06/21/2008   LEG CRAMPS 06/21/2008   ENDOMETRIAL HYPERPLASIA UNSPECIFIED 01/18/2008   Morbid obesity ([place]) 10/02/2007   ECZEMA 10/02/2007   CLUSTER HEADACHE 08/19/2007   DENTAL CARIES 08/19/2007   PCOS (polycystic ovarian syndrome) 05/27/1997   Past Medical History:  Diagnosis Date   Bilateral polycystic ovarian syndrome    Chronic kidney disease    HSV-2 (herpes simplex virus 2) infection    Hypertension 2008   Prediabetes    Vaginal Pap smear, abnormal     Family History  Problem Relation Age of Onset   Heart failure Mother    Diabetes Mother    Hypertension Mother    Breast cancer Mother    Stroke Father        Nursing home bound   Hypertension Father    Dementia Father        from strokes   Other Sister        Prediabetes   Hypertension Sister     Past Surgical History:  Procedure Laterality Date   CESAREAN SECTION N/A 06/27/2020   Procedure: CESAREAN SECTION;  Surgeon: Deliah Boston, MD;  Location: [facility] LD ORS;  Service: Obstetrics;  Laterality: N/A;   KNEE SURGERY Right 2015   torn cartilage--arthroscopic   MOUTH SURGERY  2016   9 teeth pulled for decay-upper incisors.   Social History   Occupational History   Not on file  Tobacco Use   Smoking status: Never   Smokeless tobacco: Never  Vaping Use   Vaping Use: Never used  Substance and Sexual Activity   Alcohol use: Never   Drug use: Never   Sexual activity: Yes    Birth control/protection: None

## 2021-06-28 NOTE — Addendum Note (Signed)
Addended by: Vira Browns on: 06/28/2021 01:16 PM   Modules accepted: Orders

## 2021-06-28 NOTE — Patient Instructions (Signed)
Avoid bending, stooping and avoid lifting weights greater than 10 lbs. Avoid prolong standing and walking. Avoid frequent bending and stooping  No lifting greater than 10 lbs. May use ice or moist heat for pain. Weight loss is of benefit. MRI of the lumbar spine is ordered due to L5 weakness left greater than right with worsening spondylolisthesis L4-5 to grade 2.

## 2021-07-02 ENCOUNTER — Other Ambulatory Visit: Payer: Self-pay | Admitting: Physical Medicine and Rehabilitation

## 2021-07-19 ENCOUNTER — Other Ambulatory Visit: Payer: Self-pay

## 2021-07-19 ENCOUNTER — Ambulatory Visit
Admission: RE | Admit: 2021-07-19 | Discharge: 2021-07-19 | Disposition: A | Discharge status: Home / Self Care | Payer: Medicaid Other | Source: Ambulatory Visit | Attending: Specialist | Admitting: Specialist

## 2021-07-19 DIAGNOSIS — M5416 Radiculopathy, lumbar region: Secondary | ICD-10-CM | POA: Diagnosis not present

## 2021-07-19 DIAGNOSIS — M4316 Spondylolisthesis, lumbar region: Secondary | ICD-10-CM | POA: Diagnosis not present

## 2021-07-19 DIAGNOSIS — M5136 Other intervertebral disc degeneration, lumbar region: Secondary | ICD-10-CM

## 2021-07-19 DIAGNOSIS — M48061 Spinal stenosis, lumbar region without neurogenic claudication: Secondary | ICD-10-CM | POA: Diagnosis not present

## 2021-07-19 IMAGING — MR MR LUMBAR SPINE W/O CM
4 of 5 series · 25 of 48 positions shown · non-contrast
Comparison: MRI lumbar spine [DATE].

CLINICAL DATA: Lumbar radiculopathy, symptoms persist with > 6 wks
treatment Lumbar spondylolisthesis

EXAM:
MRI LUMBAR SPINE WITHOUT CONTRAST
TECHNIQUE: Multiplanar, multisequence MR imaging of the lumbar spine was
performed. No intravenous contrast was administered.

[Series 2: T2 · sagittal · 4.0mm · 0.59mm/px · 6 of 15 slices shown (1 of 2)]
[im 1/15]
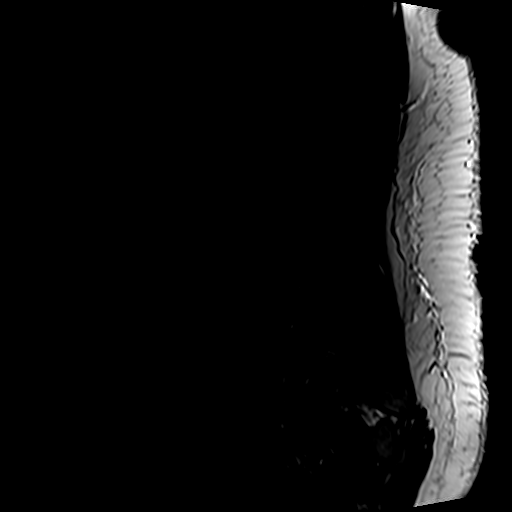
[im 3/15]
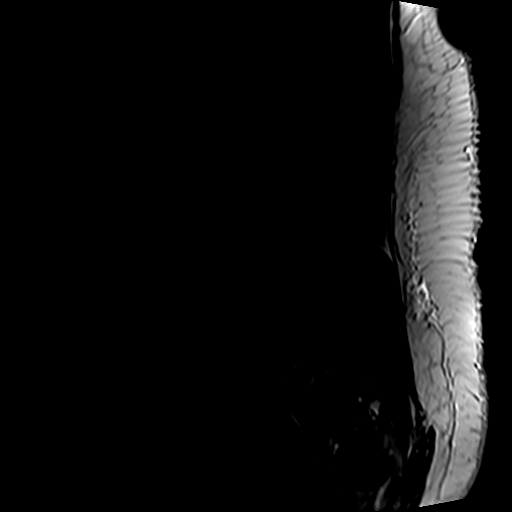
[im 6/15]
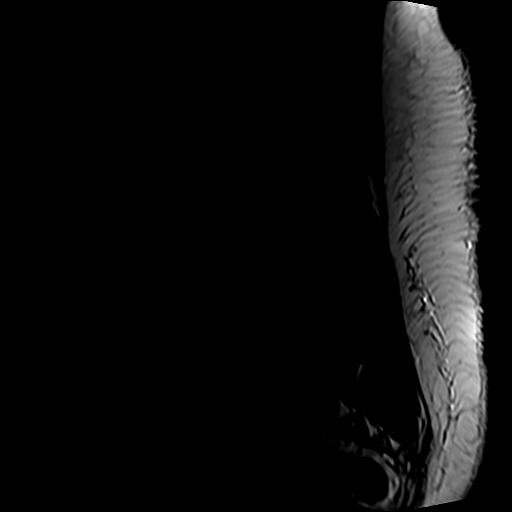
[im 9/15]
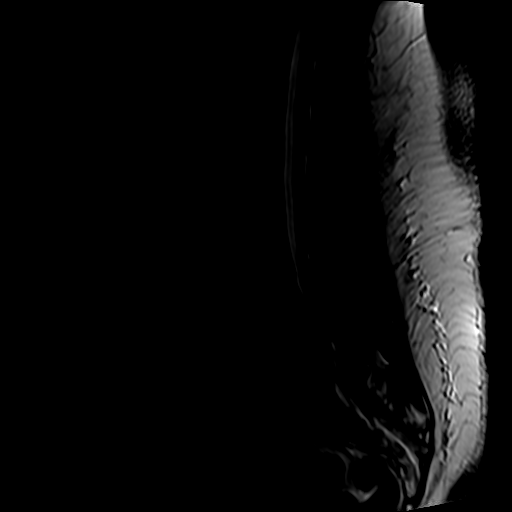
[im 12/15]
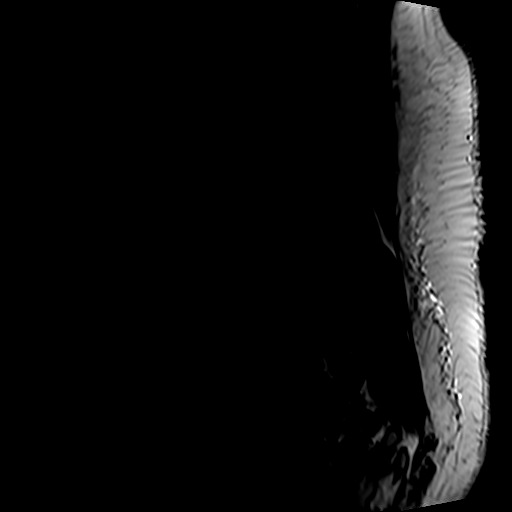
[im 15/15]
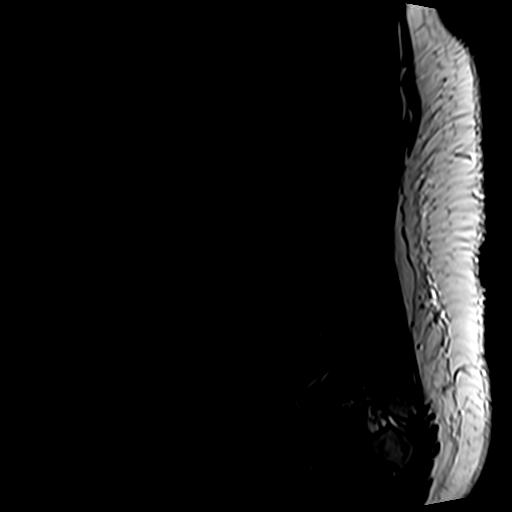

[Series 4: T1 · sagittal · 4.0mm · 0.59mm/px · 6 of 15 slices shown (1 of 2)]
[im 1/15]
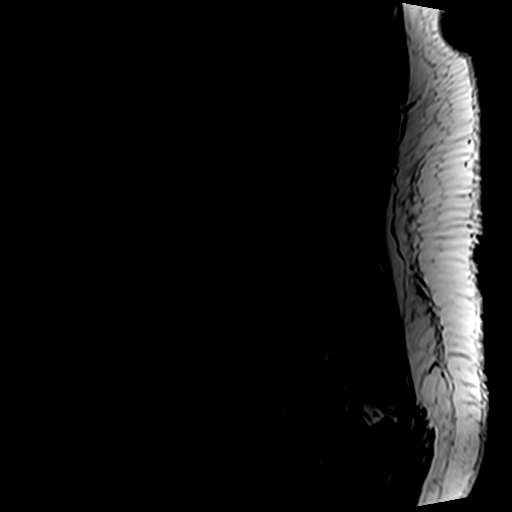
[im 3/15]
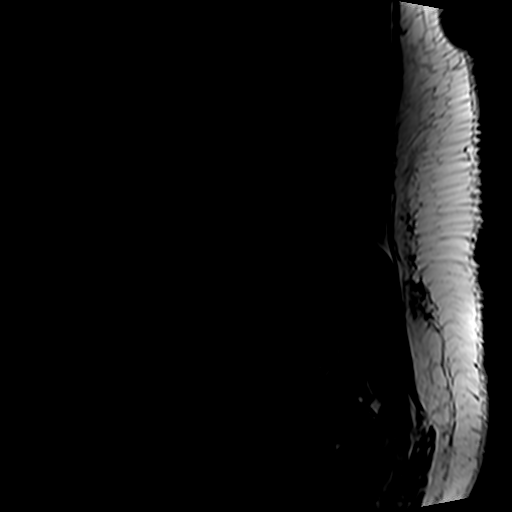
[im 6/15]
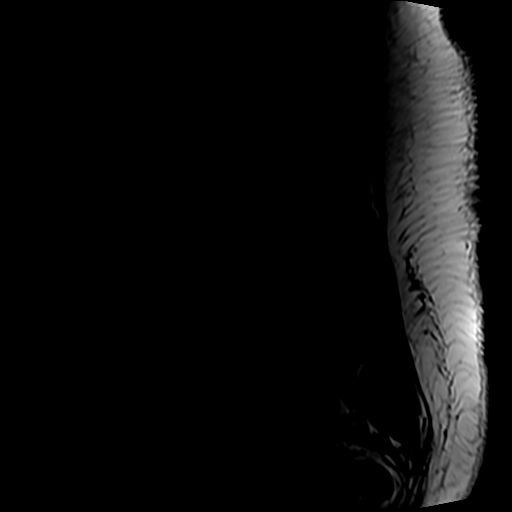
[im 9/15]
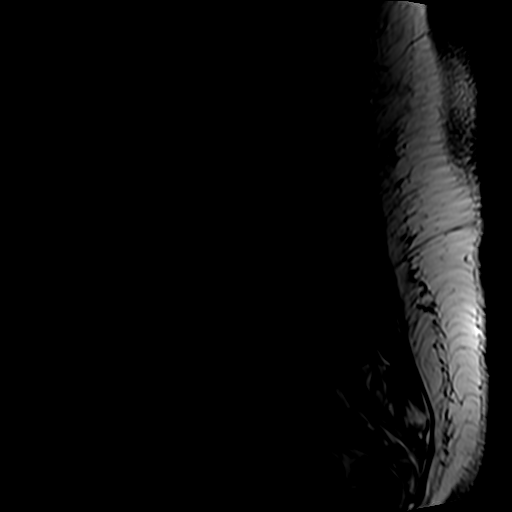
[im 12/15]
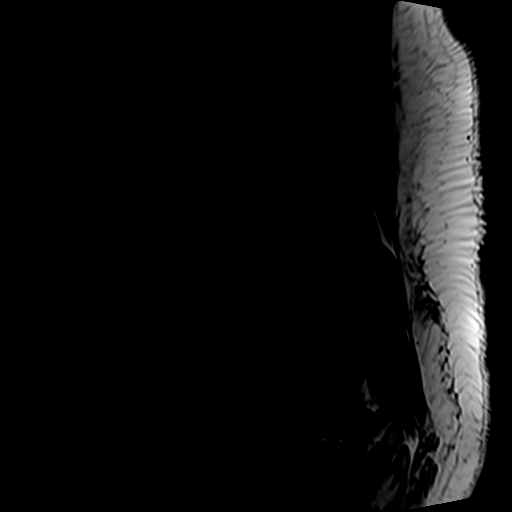
[im 15/15]
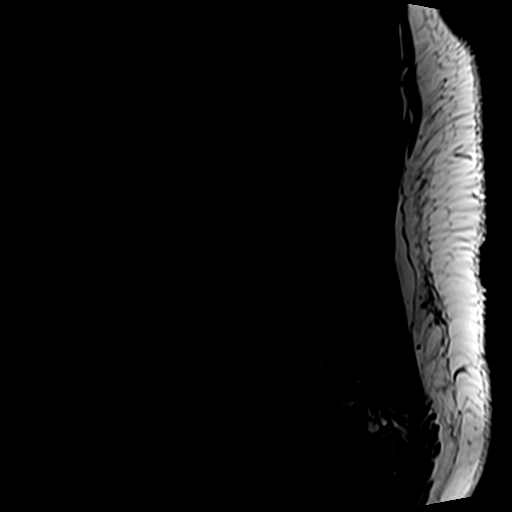

[Series 5: T2 · axial · 4.0mm · 0.70mm/px · z∈[-52,+152]mm · 9 of 37 slices shown (2 of 2)]
[im 1/37]
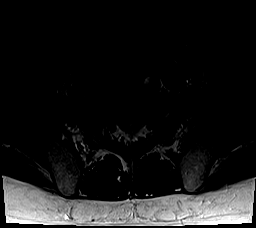
[im 6/37]
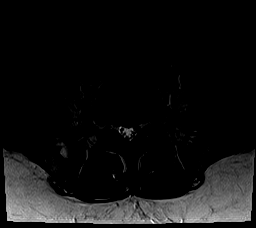
[im 11/37]
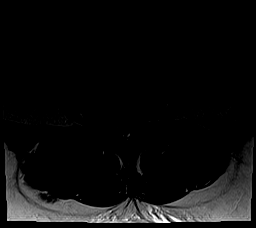
[im 16/37]
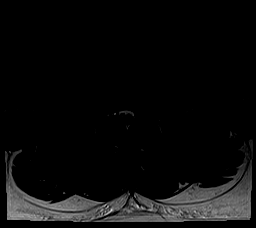
[im 19/37]
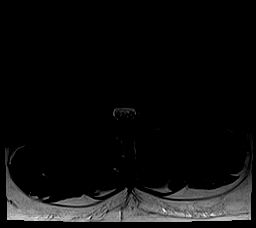
[im 21/37]
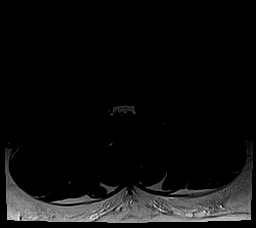
[im 26/37]
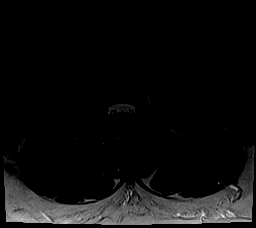
[im 31/37]
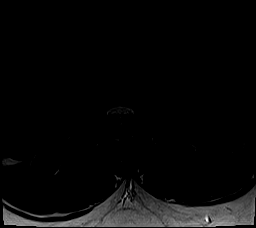
[im 37/37]
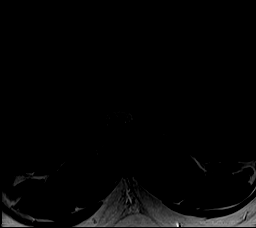

[Series 6: T1 · axial · 4.0mm · 0.35mm/px · z∈[-52,+121]mm · 4 of 37 slices shown (2 of 2)]
[im 1/37]
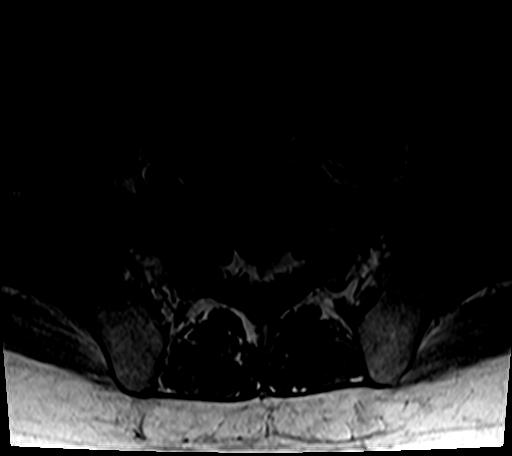
[im 6/37]
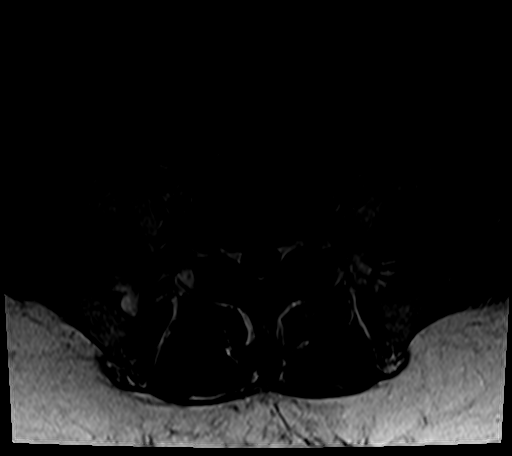
[im 19/37]
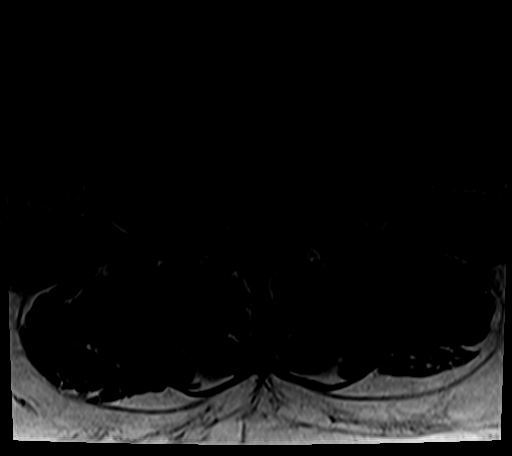
[im 31/37]
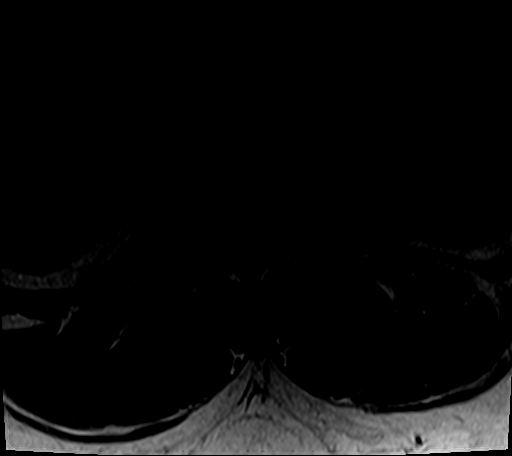

[25 of 48 positions shown; findings below may reference images not displayed]

FINDINGS: Segmentation: Standard segmentation is assumed. The inferior-most
fully formed intervertebral disc labeled L5-S1.

Alignment:  Grade 1 anterolisthesis of L4 on L5.

Vertebrae: No focal marrow edema to suggest acute fracture
discitis/osteomyelitis. No suspicious bone lesion.

Conus medullaris and cauda equina: Conus extends to the L1 level.
Conus appears normal.

Paraspinal and other soft tissues: Unremarkable.

Disc levels:

T12-L1: No significant disc protrusion, foraminal stenosis, or canal
stenosis.

L1-L2: No significant disc protrusion, foraminal stenosis, or canal
stenosis.

L2-L3: No significant disc protrusion, foraminal stenosis, or canal
stenosis.

L3-L4: Mild disc bulging with left foraminal disc protrusion. Mild
left foraminal stenosis. No significant canal or right foraminal
stenosis. Findings are similar.

L4-L5: Disc height loss and desiccation. Broad disc bulge with
moderate bilateral facet arthropathy and bilateral facet joint
effusions. Right paracentral disc protrusion appears slightly
decreased. Ligamentum flavum thickening. Resulting moderate to
severe canal stenosis, mildly improved. Similar moderate right
foraminal stenosis with right foraminal/far lateral disc protrusion.

L5-S1: Slight disc bulging with out significant canal or foraminal
stenosis.
IMPRESSION: 1. At L4-L5, slightly improved moderate to severe canal stenosis due
to slight decrease in right paracentral disc protrusion. Similar
moderate right foraminal stenosis with right foraminal/far lateral
disc protrusion.
2. At L3-L4, similar mild left foraminal stenosis.

## 2021-07-26 ENCOUNTER — Encounter: Payer: Self-pay | Admitting: Specialist

## 2021-07-26 ENCOUNTER — Other Ambulatory Visit: Payer: Self-pay

## 2021-07-26 ENCOUNTER — Ambulatory Visit (INDEPENDENT_AMBULATORY_CARE_PROVIDER_SITE_OTHER): Payer: Medicaid Other | Admitting: Specialist

## 2021-07-26 VITALS — BP 134/85 | HR 80 | Ht 67.0 in | Wt 274.0 lb

## 2021-07-26 DIAGNOSIS — M5416 Radiculopathy, lumbar region: Secondary | ICD-10-CM | POA: Diagnosis not present

## 2021-07-26 DIAGNOSIS — M4316 Spondylolisthesis, lumbar region: Secondary | ICD-10-CM | POA: Diagnosis not present

## 2021-07-26 DIAGNOSIS — M5136 Other intervertebral disc degeneration, lumbar region: Secondary | ICD-10-CM

## 2021-07-26 DIAGNOSIS — M48062 Spinal stenosis, lumbar region with neurogenic claudication: Secondary | ICD-10-CM | POA: Diagnosis not present

## 2021-07-26 MED ORDER — TRAMADOL-ACETAMINOPHEN 37.5-325 MG PO TABS: 1.0000 | ORAL_TABLET | Freq: Four times a day (QID) | ORAL | 0 refills | Status: DC | PRN

## 2021-07-26 NOTE — Addendum Note (Signed)
Addended by: Vira Browns on: 07/26/2021 12:06 PM ? ? Modules accepted: Orders ? ?

## 2021-07-26 NOTE — Patient Instructions (Signed)
Plan: Avoid bending, stooping and avoid lifting weights greater than 10 lbs. ?Avoid prolong standing and walking. ?Avoid frequent bending and stooping  ?No lifting greater than 10-15lbs. ?May use ice or moist heat for pain. ?Weight loss is of benefit. ?Handicap license is approved. ?I recommend that you consider surgery for your spinal condition with fusion and decompression at the L4-5 level this would be done with rods and screws and cages. ?The risks include risks of infection, 1/300, bleeding less than 1/4% risk of need for blood transfusion and risk to the nerve which is about 1 in 10,000. Chance of the surgery being successful in the high 90 percent range 97-98%. ?Weight loss is recommended and a BMI below 40 is recommended.  ?

## 2021-07-26 NOTE — Progress Notes (Signed)
? ?Office Visit Note ?  ?Patient: Donna Burns           ?Date of Birth: 02/09/1981           ?MRN: [id number] ?Visit Date: 07/26/2021 ?             ?Requested by: Mack Hook, MD ?[address] ?[place],  [place] [address] ?PCP: Mack Hook, MD ? ? ?Assessment & Plan: ?Visit Diagnoses:  ?1. Spondylolisthesis, lumbar region   ?2. Degenerative disc disease, lumbar   ?3. Radiculopathy, lumbar region   ?4. Spinal stenosis of lumbar region with neurogenic claudication   ? ? ?Plan: Avoid bending, stooping and avoid lifting weights greater than 10 lbs. ?Avoid prolong standing and walking. ?Avoid frequent bending and stooping  ?No lifting greater than 10-15lbs. ?May use ice or moist heat for pain. ?Weight loss is of benefit. ?Handicap license is approved. ?I recommend that you consider surgery for your spinal condition with fusion and decompression at the L4-5 level this would be done with rods and screws and cages. ?The risks include risks of infection, 1/300, bleeding less than 1/4% risk of need for blood transfusion and risk to the nerve which is about 1 in 10,000. Chance of the surgery being successful in the high 90 percent range 97-98%. ?Weight loss is recommended and a BMI below 40 is recommended.  ? ?Follow-Up Instructions: No follow-ups on file.  ? ?Orders:  ?No orders of the defined types were placed in this encounter. ? ?No orders of the defined types were placed in this encounter. ? ? ? ? Procedures: ?No procedures performed ? ? ?Clinical Data: ?Findings:  ?Plan: Avoid bending, stooping and avoid lifting weights greater than 10 lbs. ?Avoid prolong standing and walking. ?Avoid frequent bending and stooping  ?No lifting greater than 10-15lbs. ?May use ice or moist heat for pain. ?Weight loss is of benefit. ?Handicap license is approved. ?I recommend that you consider surgery for your spinal condition with fusion and decompression at the L4-5 level this would be done with rods and screws and  cages. ?The risks include risks of infection, 1/300, bleeding less than 1/4% risk of need for blood transfusion and risk to the nerve which is about 1 in 10,000. Chance of the surgery being successful in the high 90 percent range 97-98%. ?Weight loss is recommended and a BMI below 40 is recommended.  ? ? ?Subjective: ?Chief Complaint  ?Patient presents with  ?? Lower Back - Follow-up  ? ? ?41 year old female with history of neurogenic claudication and also pain with prolong standing and walking and bending and stoop.. There is left greater than right back and buttock pain. Tends to lean on the grocery cart to relieve pain. Body mass index is 42.91 kg/m?Marland Kitchen She does experience buring with urination and increased frequency. ? ? ? ?Review of Systems  ?Constitutional: Negative.   ?HENT: Negative.    ?Eyes: Negative.   ?Respiratory: Negative.    ?Cardiovascular: Negative.   ?Gastrointestinal: Negative.   ?Endocrine: Negative.   ?Genitourinary: Negative.   ?Musculoskeletal: Negative.   ?Skin: Negative.   ?Allergic/Immunologic: Negative.   ?Neurological: Negative.   ?Hematological: Negative.   ?Psychiatric/Behavioral: Negative.    ? ? ?Objective: ?Vital Signs: BP 134/85 (BP Location: Left Arm, Patient Position: Sitting)   Pulse 80   Ht 5\' 7"  (1.702 m)   Wt 274 lb (124.3 kg)   BMI 42.91 kg/m?  ? ?Physical Exam ?Constitutional:   ?   Appearance: She is well-developed.  ?  HENT:  ?   Head: Normocephalic and atraumatic.  ?Eyes:  ?   Pupils: Pupils are equal, round, and reactive to light.  ?Pulmonary:  ?   Effort: Pulmonary effort is normal.  ?   Breath sounds: Normal breath sounds.  ?Abdominal:  ?   General: Bowel sounds are normal.  ?   Palpations: Abdomen is soft.  ?Musculoskeletal:  ?   Cervical back: Normal range of motion and neck supple.  ?   Lumbar back: Negative right straight leg raise test and negative left straight leg raise test.  ?Skin: ?   General: Skin is warm and dry.  ?Neurological:  ?   Mental Status: She is  alert and oriented to person, place, and time.  ?Psychiatric:     ?   Behavior: Behavior normal.     ?   Thought Content: Thought content normal.     ?   Judgment: Judgment normal.  ? ? ?Back Exam  ? ?Tenderness  ?The patient is experiencing tenderness in the lumbar. ? ?Range of Motion  ?Extension:  abnormal  ?Flexion:  abnormal  ?Lateral bend right:  abnormal  ?Lateral bend left:  abnormal  ?Rotation right:  abnormal  ?Rotation left:  abnormal  ? ?Muscle Strength  ?Right Quadriceps:  5/5  ?Left Quadriceps:  5/5  ?Right Hamstrings:  5/5  ?Left Hamstrings:  5/5  ? ?Tests  ?Straight leg raise right: negative ?Straight leg raise left: negative ? ?Reflexes  ?Patellar:  2/4 ?Achilles:  2/4 ? ?Other  ?Toe walk: abnormal ?Heel walk: abnormal ? ? ? ?Specialty Comments:  ?No specialty comments available. ? ?Imaging: ?No results found. ? ? ?PMFS History: ?Patient Active Problem List  ? Diagnosis Date Noted  ?? Peripheral edema 11/14/2020  ?? Genital herpes simplex 11/14/2020  ?? [redacted] weeks gestation of pregnancy 06/27/2020  ?? Pregnancy 06/27/2020  ?? Non-reassuring electronic fetal monitoring tracing 06/13/2020  ?? Encounter for supervision of high risk pregnancy due to advanced maternal age in primigravida 11/26/2019  ?? Chronic bilateral low back pain with left-sided sciatica 09/18/2019  ?? Eczema 09/18/2019  ?? Stage 1 chronic kidney disease 02/26/2019  ?? Tinea pedis of both feet 02/26/2019  ?? Tinea corporis 02/26/2019  ?? Prediabetes   ?? VAGINAL DISCHARGE 04/13/2010  ?? ANEMIA 10/27/2008  ?? VAGINITIS, BACTERIAL 10/27/2008  ?? Essential hypertension 06/21/2008  ?? LEG CRAMPS 06/21/2008  ?? ENDOMETRIAL HYPERPLASIA UNSPECIFIED 01/18/2008  ?? Morbid obesity ([place]) 10/02/2007  ?? ECZEMA 10/02/2007  ?? CLUSTER HEADACHE 08/19/2007  ?? DENTAL CARIES 08/19/2007  ?? PCOS (polycystic ovarian syndrome) 05/27/1997  ? ?Past Medical History:  ?Diagnosis Date  ?? Bilateral polycystic ovarian syndrome   ?? Chronic kidney disease   ??  HSV-2 (herpes simplex virus 2) infection   ?? Hypertension 2008  ?? Prediabetes   ?? Vaginal Pap smear, abnormal   ?  ?Family History  ?Problem Relation Age of Onset  ?? Heart failure Mother   ?? Diabetes Mother   ?? Hypertension Mother   ?? Breast cancer Mother   ?? Stroke Father   ?     Nursing home bound  ?? Hypertension Father   ?? Dementia Father   ?     from strokes  ?? Other Sister   ?     Prediabetes  ?? Hypertension Sister   ?  ?Past Surgical History:  ?Procedure Laterality Date  ?? CESAREAN SECTION N/A 06/27/2020  ? Procedure: CESAREAN SECTION;  Surgeon: Deliah Boston, MD;  Location: [facility] LD  ORS;  Service: Obstetrics;  Laterality: N/A;  ?? KNEE SURGERY Right 2015  ? torn cartilage--arthroscopic  ?? MOUTH SURGERY  2016  ? 9 teeth pulled for decay-upper incisors.  ? ?Social History  ? ?Occupational History  ?? Not on file  ?Tobacco Use  ?? Smoking status: Never  ?? Smokeless tobacco: Never  ?Vaping Use  ?? Vaping Use: Never used  ?Substance and Sexual Activity  ?? Alcohol use: Never  ?? Drug use: Never  ?? Sexual activity: Yes  ?  Birth control/protection: None  ? ? ? ? ? ? ?

## 2021-07-30 ENCOUNTER — Other Ambulatory Visit: Payer: Self-pay | Admitting: Internal Medicine

## 2021-07-30 ENCOUNTER — Telehealth: Payer: Self-pay | Admitting: Internal Medicine

## 2021-07-30 MED ORDER — LABETALOL HCL 200 MG PO TABS: 200.0000 mg | ORAL_TABLET | Freq: Two times a day (BID) | ORAL | 1 refills | Status: DC

## 2021-07-30 NOTE — Telephone Encounter (Addendum)
Pt.called requesting refill for Labetalol 200 mg tablet.  ?Pt. Also requested a referral for weight loss surgery.  Weight management is not working for her and prefers the weight loss surgery to lose the weight  in order to have the back surgery she needs. ? ?Please advise ?

## 2021-07-30 NOTE — Telephone Encounter (Signed)
Pt

## 2021-07-31 NOTE — Telephone Encounter (Signed)
Spoke to pt. And informed we have added her to wait list for OV to discuss bariatric surgery.  ?Pt. Is requesting referral to a weight management clinic in the meantime. ?

## 2021-08-20 ENCOUNTER — Other Ambulatory Visit: Payer: Self-pay

## 2021-08-20 ENCOUNTER — Ambulatory Visit (INDEPENDENT_AMBULATORY_CARE_PROVIDER_SITE_OTHER): Payer: Medicaid Other

## 2021-08-20 ENCOUNTER — Encounter (HOSPITAL_COMMUNITY): Payer: Self-pay | Admitting: *Deleted

## 2021-08-20 ENCOUNTER — Ambulatory Visit (HOSPITAL_COMMUNITY)
Admission: EM | Admit: 2021-08-20 | Discharge: 2021-08-20 | Disposition: A | Discharge status: Home / Self Care | Payer: Medicaid Other | Attending: Family Medicine | Admitting: Family Medicine

## 2021-08-20 DIAGNOSIS — M25522 Pain in left elbow: Secondary | ICD-10-CM

## 2021-08-20 IMAGING — DX DG ELBOW 2V*L*
2 series · 2 of 2 positions shown · non-contrast
Comparison: None.

CLINICAL DATA: left elbow pain

EXAM:
LEFT ELBOW - 2 VIEW

[elbow ap]
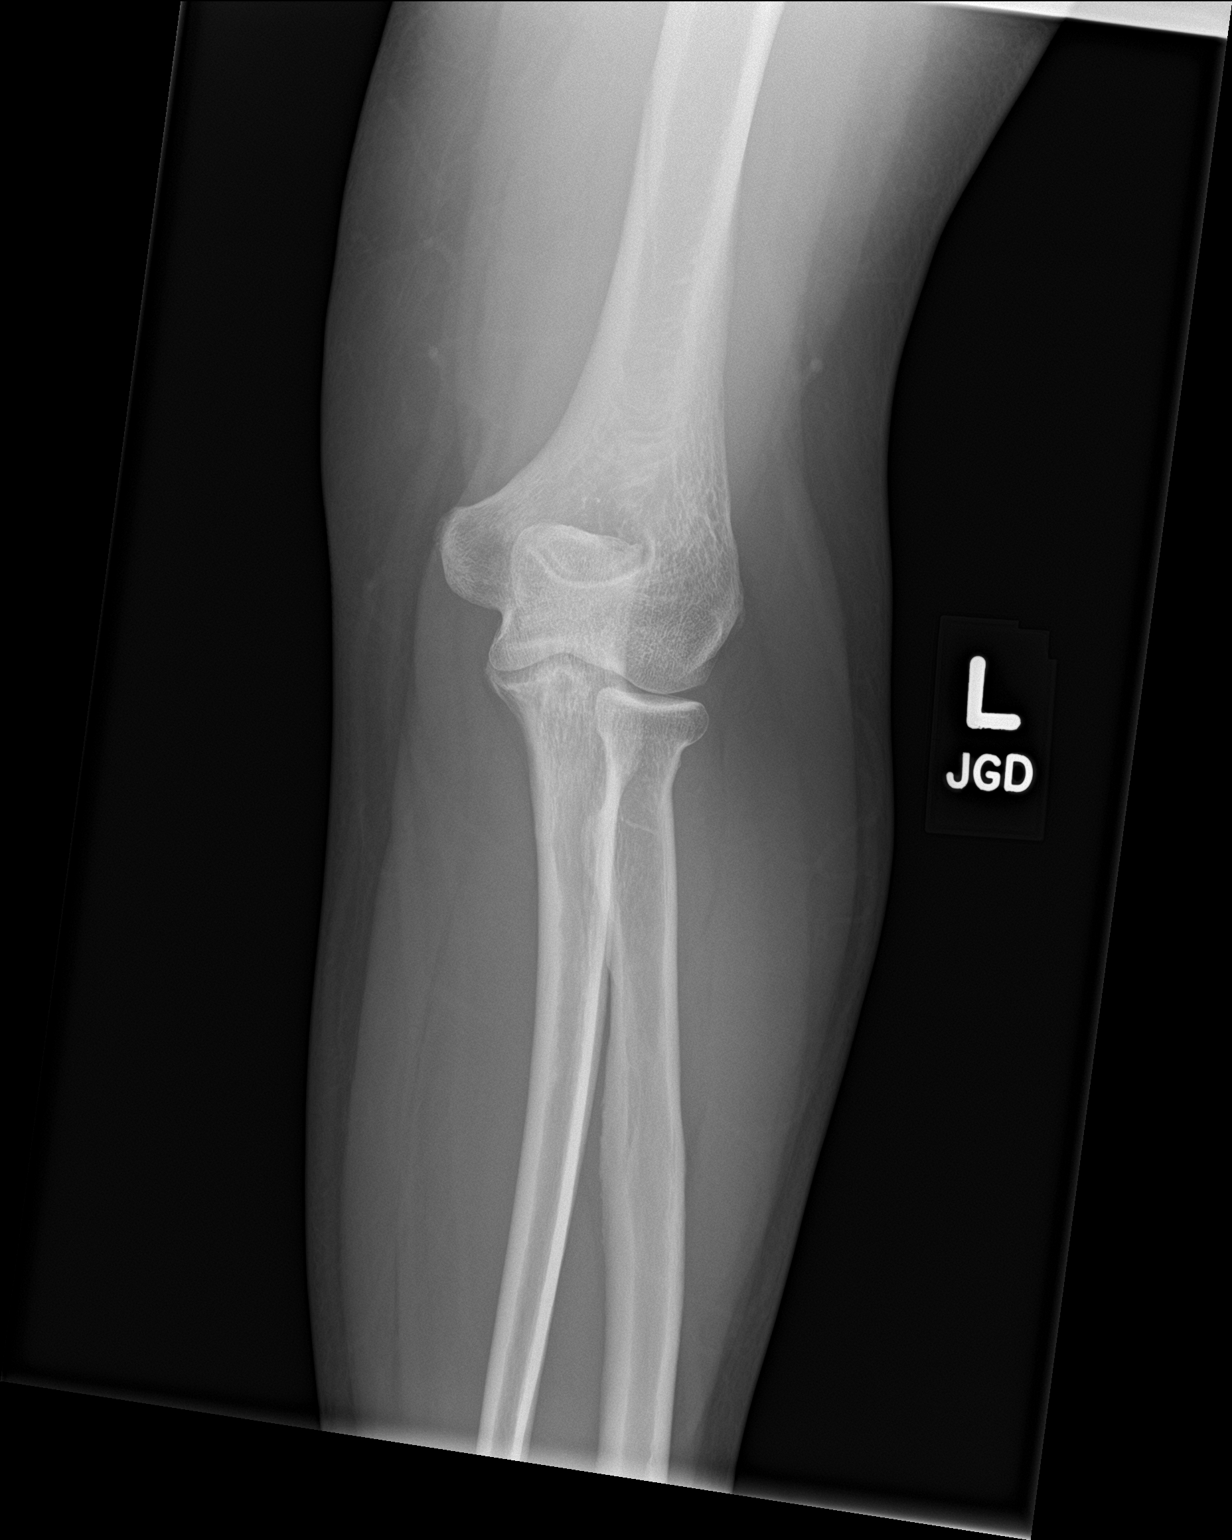

[elbow lat]
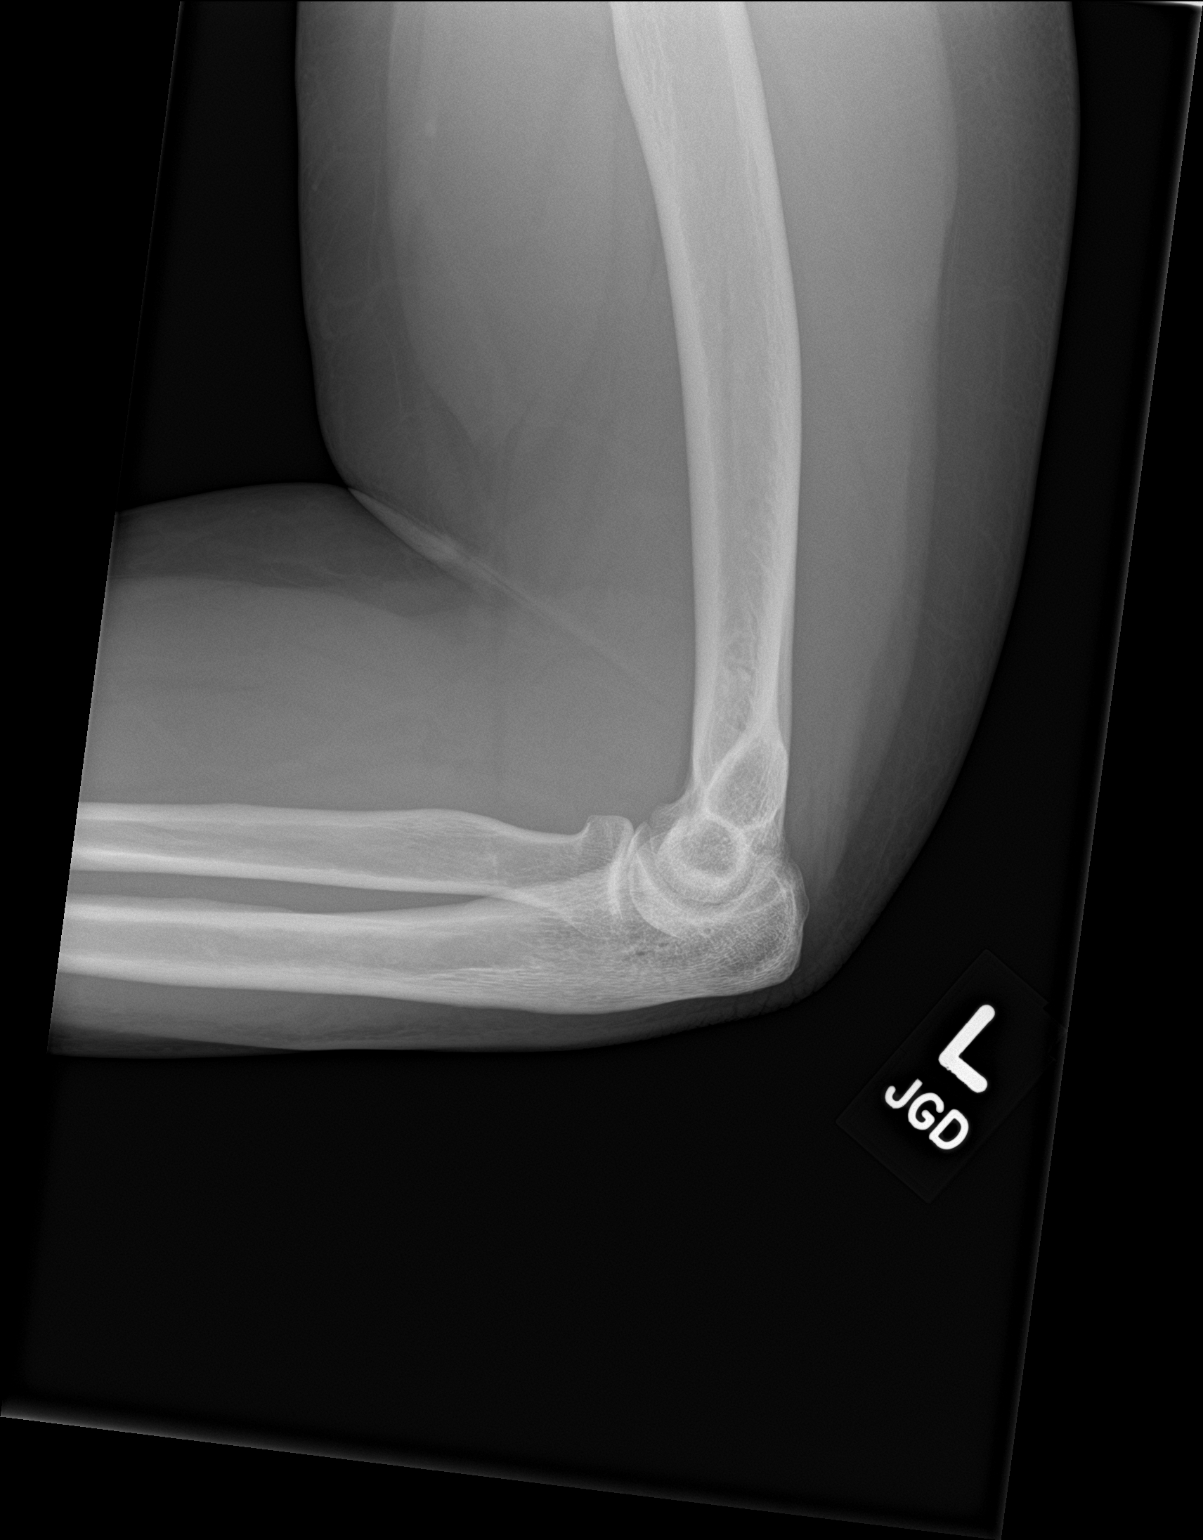

[2 of 2 positions shown; findings below may reference images not displayed]

FINDINGS: There is no evidence of fracture, dislocation, or joint effusion.
There is no evidence of arthropathy or other focal bone abnormality.
Soft tissues are unremarkable.
IMPRESSION: Negative.

## 2021-08-20 MED ORDER — METHYLPREDNISOLONE 4 MG PO TBPK: ORAL_TABLET | ORAL | 0 refills | Status: DC

## 2021-08-20 NOTE — Discharge Instructions (Addendum)
Xrays showed no broken bones or cysts on bones. ? ?Take the medrol dose pack according to package instructions. Followup with Dr. Louanne Skye, especially if not improving. ?

## 2021-08-20 NOTE — ED Provider Notes (Addendum)
?[facility] ? ? ? ?CSN: [id number] ?Arrival date & time: 08/20/21  1245 ? ? ?  ? ?History   ?Chief Complaint ?Chief Complaint  ?Patient presents with  ? Elbow Injury  ? ? ?HPI ?Donna Burns is a 41 y.o. female.  ? ?HPI ?Here with left elbow pain for the last few weeks.  It has worsened some and it hurts whenever she moves her fingers or picks up a little bit he thinks.  No known trauma or injury. ? ?On review of the chart past history includes chronic kidney disease, hypertension, and some concern about SLE.  She has already seen Dr. Louanne Skye about her back. ? ?Past Medical History:  ?Diagnosis Date  ? Bilateral polycystic ovarian syndrome   ? Chronic kidney disease   ? HSV-2 (herpes simplex virus 2) infection   ? Hypertension 2008  ? Prediabetes   ? Vaginal Pap smear, abnormal   ? ? ?Patient Active Problem List  ? Diagnosis Date Noted  ? Peripheral edema 11/14/2020  ? Genital herpes simplex 11/14/2020  ? [redacted] weeks gestation of pregnancy 06/27/2020  ? Pregnancy 06/27/2020  ? Non-reassuring electronic fetal monitoring tracing 06/13/2020  ? Encounter for supervision of high risk pregnancy due to advanced maternal age in primigravida 11/26/2019  ? Chronic bilateral low back pain with left-sided sciatica 09/18/2019  ? Eczema 09/18/2019  ? Stage 1 chronic kidney disease 02/26/2019  ? Tinea pedis of both feet 02/26/2019  ? Tinea corporis 02/26/2019  ? Prediabetes   ? VAGINAL DISCHARGE 04/13/2010  ? ANEMIA 10/27/2008  ? VAGINITIS, BACTERIAL 10/27/2008  ? Essential hypertension 06/21/2008  ? LEG CRAMPS 06/21/2008  ? ENDOMETRIAL HYPERPLASIA UNSPECIFIED 01/18/2008  ? Morbid obesity ([place]) 10/02/2007  ? ECZEMA 10/02/2007  ? CLUSTER HEADACHE 08/19/2007  ? DENTAL CARIES 08/19/2007  ? PCOS (polycystic ovarian syndrome) 05/27/1997  ? ? ?Past Surgical History:  ?Procedure Laterality Date  ? CESAREAN SECTION N/A 06/27/2020  ? Procedure: CESAREAN SECTION;  Surgeon: Deliah Boston, MD;  Location: [facility] LD ORS;  Service:  Obstetrics;  Laterality: N/A;  ? KNEE SURGERY Right 2015  ? torn cartilage--arthroscopic  ? MOUTH SURGERY  2016  ? 9 teeth pulled for decay-upper incisors.  ? ? ?OB History   ? ? Gravida  ?1  ? Para  ?1  ? Term  ?0  ? Preterm  ?1  ? AB  ?0  ? Living  ?1  ?  ? ? SAB  ?0  ? IAB  ?0  ? Ectopic  ?0  ? Multiple  ?0  ? Live Births  ?1  ?   ?  ?  ? ? ? ?Home Medications   ? ?Prior to Admission medications   ?Medication Sig Start Date End Date Taking? Authorizing Provider  ?methylPREDNISolone (MEDROL DOSEPAK) 4 MG TBPK tablet Take as per package instructions 08/20/21  Yes Rishik Tubby, Gwenlyn Perking, MD  ?acetaminophen (TYLENOL) 500 MG tablet Take 1,000 mg by mouth every 6 (six) hours as needed for mild pain or headache.    [provider]  ?labetalol (NORMODYNE) 200 MG tablet Take 1 tablet (200 mg total) by mouth 2 (two) times daily. 07/30/21   Mack Hook, MD  ?meloxicam (MOBIC) 15 MG tablet Take 1 tablet (15 mg total) by mouth daily. Take with food 06/05/21   Magnus Sinning, MD  ?NIFEdipine (ADALAT CC) 90 MG 24 hr tablet Take 90 mg by mouth daily.    [provider]  ? ? ?Family History ?Family History  ?  Problem Relation Age of Onset  ? Heart failure Mother   ? Diabetes Mother   ? Hypertension Mother   ? Breast cancer Mother   ? Stroke Father   ?     Nursing home bound  ? Hypertension Father   ? Dementia Father   ?     from strokes  ? Other Sister   ?     Prediabetes  ? Hypertension Sister   ? ? ?Social History ?Social History  ? ?Tobacco Use  ? Smoking status: Never  ? Smokeless tobacco: Never  ?Vaping Use  ? Vaping Use: Never used  ?Substance Use Topics  ? Alcohol use: Never  ? Drug use: Never  ? ? ? ?Allergies   ?Pork-derived products ? ? ?Review of Systems ?Review of Systems ? ? ?Physical Exam ?Triage Vital Signs ?ED Triage Vitals  ?Enc Vitals Group  ?   BP 08/20/21 1434 119/81  ?   Pulse Rate 08/20/21 1434 76  ?   Resp 08/20/21 1434 18  ?   Temp 08/20/21 1434 98 ?F (36.7 ?C)  ?   Temp src --   ?    SpO2 08/20/21 1434 96 %  ?   Weight --   ?   Height --   ?   Head Circumference --   ?   Peak Flow --   ?   Pain Score 08/20/21 1432 9  ?   Pain Loc --   ?   Pain Edu? --   ?   Excl. in [place]? --   ? ?No data found. ? ?Updated Vital Signs ?BP 119/81   Pulse 76   Temp 98 ?F (36.7 ?C)   Resp 18   LMP 08/06/2021   SpO2 96%  ? ?Visual Acuity ?Right Eye Distance:   ?Left Eye Distance:   ?Bilateral Distance:   ? ?Right Eye Near:   ?Left Eye Near:    ?Bilateral Near:    ? ?Physical Exam ?Vitals reviewed.  ?Constitutional:   ?   General: She is not in acute distress. ?Musculoskeletal:  ?   Comments: There is tenderness over the olecranon process of her ulna.  No edema there may be some prominence of tissue just lateral to the ulnar bone. She is NOT tender over the lateral or medial epicondyle.  ?Skin: ?   Capillary Refill: Capillary refill takes less than 2 seconds.  ?   Coloration: Skin is not pale.  ?Neurological:  ?   General: No focal deficit present.  ?   Mental Status: She is oriented to person, place, and time.  ?Psychiatric:     ?   Behavior: Behavior normal.  ? ? ? ?UC Treatments / Results  ?Labs ?(all labs ordered are listed, but only abnormal results are displayed) ?Labs Reviewed - No data to display ? ?EKG ? ? ?Radiology ?DG Elbow 2 Views Left ? ?Result Date: 08/20/2021 ?CLINICAL DATA:  left elbow pain EXAM: LEFT ELBOW - 2 VIEW COMPARISON:  None. FINDINGS: There is no evidence of fracture, dislocation, or joint effusion. There is no evidence of arthropathy or other focal bone abnormality. Soft tissues are unremarkable. IMPRESSION: Negative. Electronically Signed   By: Iven Finn M.D.   On: 08/20/2021 15:09   ? ?Procedures ?Procedures (including critical care time) ? ?Medications Ordered in UC ?Medications - No data to display ? ?Initial Impression / Assessment and Plan / UC Course  ?I have reviewed the triage vital signs and the nursing notes. ? ?  Pertinent labs & imaging results that were available during  my care of the patient were reviewed by me and considered in my medical decision making (see chart for details). ? ?  ? ?Xray is negative for bony problem. Will treat with medrol dose pack and ask her to followup with ortho ?Final Clinical Impressions(s) / UC Diagnoses  ? ?Final diagnoses:  ?Left elbow pain  ? ? ? ?Discharge Instructions   ? ?  ?Xrays showed no broken bones or cysts on bones. ? ?Take the medrol dose pack according to package instructions. Followup with Dr. Louanne Skye, especially if not improving. ? ? ? ? ?ED Prescriptions   ? ? Medication Sig Dispense Auth. Provider  ? methylPREDNISolone (MEDROL DOSEPAK) 4 MG TBPK tablet Take as per package instructions 1 each Barrett Henle, MD  ? ?  ? ?I have reviewed the PDMP during this encounter. ?  ?Barrett Henle, MD ?08/20/21 1524 ? ?  ?Barrett Henle, MD ?08/20/21 1525 ? ?

## 2021-08-20 NOTE — ED Triage Notes (Signed)
Pt reports for 11/2 weeks  is having elbow pain with out injury. Pt reports difficulty picking up small items. ?

## 2021-08-21 DIAGNOSIS — A609 Anogenital herpesviral infection, unspecified: Secondary | ICD-10-CM | POA: Diagnosis not present

## 2021-08-21 DIAGNOSIS — Z319 Encounter for procreative management, unspecified: Secondary | ICD-10-CM | POA: Diagnosis not present

## 2021-08-21 DIAGNOSIS — Z1231 Encounter for screening mammogram for malignant neoplasm of breast: Secondary | ICD-10-CM | POA: Diagnosis not present

## 2021-08-21 DIAGNOSIS — N926 Irregular menstruation, unspecified: Secondary | ICD-10-CM | POA: Diagnosis not present

## 2021-08-21 DIAGNOSIS — M549 Dorsalgia, unspecified: Secondary | ICD-10-CM | POA: Diagnosis not present

## 2021-08-21 DIAGNOSIS — Z124 Encounter for screening for malignant neoplasm of cervix: Secondary | ICD-10-CM | POA: Diagnosis not present

## 2021-08-21 DIAGNOSIS — N08 Glomerular disorders in diseases classified elsewhere: Secondary | ICD-10-CM | POA: Diagnosis not present

## 2021-08-21 DIAGNOSIS — Z3009 Encounter for other general counseling and advice on contraception: Secondary | ICD-10-CM | POA: Diagnosis not present

## 2021-08-21 DIAGNOSIS — E282 Polycystic ovarian syndrome: Secondary | ICD-10-CM | POA: Diagnosis not present

## 2021-10-08 ENCOUNTER — Telehealth: Payer: Self-pay

## 2021-10-08 NOTE — Telephone Encounter (Signed)
Patient called to ask for rheumatology referral. Initially was supposed to see a dermatologist, but dermatology office was not accepting new medicaid patients. Visited the ED on 08/20/21 and had inflammation of both elbows. Patient would no longer like dermatology referral, wants to get rheumatology referral instead.  ?

## 2021-10-13 ENCOUNTER — Other Ambulatory Visit: Payer: Self-pay | Admitting: Internal Medicine

## 2021-10-30 ENCOUNTER — Ambulatory Visit (INDEPENDENT_AMBULATORY_CARE_PROVIDER_SITE_OTHER): Payer: Medicaid Other | Admitting: Internal Medicine

## 2021-10-30 VITALS — BP 136/88 | HR 76 | Resp 16 | Ht 67.0 in | Wt 284.0 lb

## 2021-10-30 DIAGNOSIS — M2550 Pain in unspecified joint: Secondary | ICD-10-CM | POA: Diagnosis not present

## 2021-10-30 DIAGNOSIS — N183 Chronic kidney disease, stage 3 unspecified: Secondary | ICD-10-CM

## 2021-10-30 DIAGNOSIS — M7712 Lateral epicondylitis, left elbow: Secondary | ICD-10-CM

## 2021-10-30 DIAGNOSIS — M7711 Lateral epicondylitis, right elbow: Secondary | ICD-10-CM

## 2021-10-30 DIAGNOSIS — I10 Essential (primary) hypertension: Secondary | ICD-10-CM | POA: Diagnosis not present

## 2021-10-30 DIAGNOSIS — L989 Disorder of the skin and subcutaneous tissue, unspecified: Secondary | ICD-10-CM | POA: Diagnosis not present

## 2021-10-30 MED ORDER — UNABLE TO FIND: 0 refills | Status: DC

## 2021-10-30 MED ORDER — ELBOW SUPPORT/PRESSURE PADS MISC: 0 refills | Status: DC

## 2021-10-30 NOTE — Progress Notes (Signed)
Subjective:    Patient ID: Donna Burns, female   DOB: 31-Oct-1980, 41 y.o.   MRN: [id number]   HPI   Left elbow pain:  States the pain started gradually about a week before she went to ED on 08/20/2021.  She denies history of injury, but thought she had fractured something in her elbow due to the pain.  She denies any history of erythema of swelling of the joint.  Xrays showed no fracture or soft tissue/bony changes to suggest cause.  On exam in ED, she was not tender over either epicondyle.  Was more tender over olecranon process.  She cannot recall repetitive injury other than recurrent lifting of her child--weighs about 20 1/2 lbs and then also groceries in and out of car.   Was given corticosteroid burst and taper with improvement of her pain, but pain did not resolve.  Some days are worse than others.  Unable to lift much with the arm due to pain.  Started with Right elbow pain about 4 weeks after the left elbow and that pain comes and goes as well and not as severe as the left.  She is right handed. Has had stiffness and pain in right wrist. In past, on and off, arthralgias of both knees.  Surgery on right knee for torn meniscus in 2014 or 2015.    2.  Lumbar spinal stenosis with neurogenic claudication. Followed by Dr. Louanne Skye.  She has been recommended to have lumbar decompression and fusion surgery.  She would like to wait until she can see if she can lose weight first for better healing/outcome.   3.  CKD:  Reportedly, Dr. Joelyn Oms feels she may have SLE as cause of CKD.  Not clear why.  She reports no labs to evaluate have been done nor does he want to perform a kidney biopsy.  Not clear if this was just mentioned as a possibility.  She has had skin lesions with generally central clearing and fine flaking at edges scattered on trunk, legs, arms that last from spring until fall.  Has had this every year since 2006 since has lived in [place] (moved from [place])  No facial rash, no  swelling or discoloration around eyes.   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  Does not sound like there is knowledge as to cause of their CKD as well.   4.  Hypertension:  Has been out of Labetalol for 4 days (actually has been cutting dose in half so not completely out.  At home she is generally in 120/70-80s.    5.  Skin lesions:  as in #3.  Sent her to Dermatology, but no one locally taking new Medicaid patients.   6.  Obesity:  Continuing to look into bariatric surgery.  Has spoken with a friend who went through it and also a webinar to learn more about risks.  She feels this is the best option for her.  Discussed GLP-1 agonists could be an option except does not appear to be enough information about patients with CKD and use, so would not consider that a good option at this point.  Renal does not want her on Metformin with her CKD.    Current Meds  Medication Sig   acetaminophen (TYLENOL) 500 MG tablet Take 1,000 mg by mouth every 6 (six) hours as needed for mild pain or headache.   labetalol (NORMODYNE) 200 MG tablet TAKE 1 TABLET(200 MG) BY MOUTH TWICE DAILY   NIFEdipine (  ADALAT CC) 90 MG 24 hr tablet Take 90 mg by mouth daily.   Allergies  Allergen Reactions   Pork-Derived Products Other (See Comments)    Pt does not eat pork or use pork-derived products     Review of Systems    Objective:   BP 136/88 (BP Location: Right Arm, Patient Position: Sitting, Cuff Size: Normal)   Pulse 76   Resp 16   Ht _0  (1.702 m)   Wt 284 lb (128.8 kg)   LMP 10/14/2021 (Exact Date)   Breastfeeding No   BMI 44.48 kg/m   Physical Exam NAD HEENT:  PERRL, EOMI Neck:  Supple, No adenopathy Chest:  CTA CV:  RRR without murmur or rub. Radial and DP pulses normal and equal. Skin:  multiple lesions on extrems/trunk.  Face uninvolved.  Differing sizes and shapes of lesions with central portion appearing uninvolved and very fine peeling of skin in concentric fashion.  No erythema or  built up edges where flaking occurring. Tender over bilateral lateral epicondyles, L>>R.  No erythema or swelling.  Increased pain with supination against opposing force.   Other joints:  without erythema, synovial thickening or swelling.  Full ROM.  No definite joint line tenderness of knees.     Assessment & Plan    Hypertension:  better controlled today.  Perhaps her stress with workplace at last visit was substantially affecting.    2.  CKD:  Creatinine at max of 1.36 from what I see.  Send for notes from nephrology.  Not clear if felt to be from poorly controlled hypertension in past vs other systemic disease as per patient history today.  Checking ANA  3.  Skin lesions:  will refer to another dermatologist we believe is taking Medicaid.  Suspect will need biopsy.  Possibly due to something such as lupus, not clear systemic at this point    4.  Bilateral lateral epicondylitis:  not clear if part of systemic disease as above.  Elbow cushioning for sleep and armcast air splint during the day.  With skin, possible related CKD, and now joint complaints, check ESR, ANA, RF.  No clear signs of inflammatory arthritis at this point.

## 2021-10-31 ENCOUNTER — Ambulatory Visit: Payer: Medicaid Other | Admitting: Internal Medicine

## 2021-10-31 LAB — BASIC METABOLIC PANEL
BUN/Creatinine Ratio: 11 (ref 9–23)
BUN: 15 mg/dL (ref 6–24)
CO2: 24 mmol/L (ref 20–29)
Calcium: 10.3 mg/dL — ABNORMAL HIGH (ref 8.7–10.2)
Chloride: 107 mmol/L — ABNORMAL HIGH (ref 96–106)
Creatinine, Ser: 1.36 mg/dL — ABNORMAL HIGH (ref 0.57–1.00)
Glucose: 120 mg/dL — ABNORMAL HIGH (ref 70–99)
Potassium: 4.4 mmol/L (ref 3.5–5.2)
Sodium: 144 mmol/L (ref 134–144)
eGFR: 50 mL/min/{1.73_m2} — ABNORMAL LOW (ref >59)

## 2021-10-31 LAB — SEDIMENTATION RATE: Sed Rate: 40 mm/hr — ABNORMAL HIGH (ref 0–32)

## 2021-11-01 ENCOUNTER — Ambulatory Visit: Payer: Medicaid Other | Admitting: Surgery

## 2021-11-01 ENCOUNTER — Encounter: Payer: Self-pay | Admitting: Surgery

## 2021-11-01 ENCOUNTER — Ambulatory Visit: Payer: Self-pay

## 2021-11-01 VITALS — BP 117/79 | HR 78 | Ht 67.0 in | Wt 284.0 lb

## 2021-11-01 DIAGNOSIS — M25521 Pain in right elbow: Secondary | ICD-10-CM | POA: Diagnosis not present

## 2021-11-01 DIAGNOSIS — M2550 Pain in unspecified joint: Secondary | ICD-10-CM | POA: Diagnosis not present

## 2021-11-01 DIAGNOSIS — M7711 Lateral epicondylitis, right elbow: Secondary | ICD-10-CM | POA: Diagnosis not present

## 2021-11-01 NOTE — Progress Notes (Signed)
Office Visit Note   Patient: Donna Burns           Date of Birth: [birth year]           MRN: [id number] Visit Date: 11/01/2021              Requested by: Mack Hook, MD [place],  [place] [address] PCP: Mack Hook, MD   Assessment & Plan: Visit Diagnoses:  1. Pain in right elbow   2. Polyarthralgia   3. Lateral epicondylitis, right elbow     Plan: With patient's polyarthralgia I recommend getting a CBC and arthritis panel.  Follow-up with Dr. Lorin Mercy in 2 weeks for recheck and he can go over the results.  Follow-Up Instructions: Return in about 3 weeks (around 11/22/2021) for WITH DR YATES TO REVIEW LABS .   Orders:  Orders Placed This Encounter  Procedures   XR Elbow 2 Views Right   CBC   Antinuclear Antib (ANA)   Uric acid   Sed Rate (ESR)   Rheumatoid Factor   Anti-nuclear ab-titer (ANA titer)   No orders of the defined types were placed in this encounter.     Procedures: No procedures performed   Clinical Data: No additional findings.   Subjective: Chief Complaint  Patient presents with   Right Elbow - Pain   Left Elbow - Pain    HPI Patient comes in today with complaints of bilateral elbow pain.  Reviewed patient's notes and she was seen at the urgent care August 20, 2021 with left elbow pain.  She was given a prednisone taper.  States that this did give temporary improvement.  Currently she states that left elbow worse than the right.  Pain off and on.  No swelling.  She states that her mother sees a rheumatologist for some condition. Review of Systems   Objective: Vital Signs: BP 117/79   Pulse 78   Ht 5' 7" (1.702 m)   Wt 284 lb (128.8 kg)   LMP 10/14/2021 (Exact Date)   BMI 44.48 kg/m   Physical Exam HENT:     Head: Normocephalic.  Pulmonary:     Effort: No respiratory distress.  Musculoskeletal:     Comments: Bilateral elbows good range of motion.  Both elbows are somewhat tender to palpation.  Bilateral  wrist joints are mildly tender.  No swelling there.  Patient is neurologically intact.  Bilateral shoulders unremarkable.  Neurological:     Mental Status: She is alert and oriented to person, place, and time.     Ortho Exam  Specialty Comments:  No specialty comments available.  Imaging: No results found.   PMFS History: Patient Active Problem List   Diagnosis Date Noted   Peripheral edema 11/14/2020   Genital herpes simplex 11/14/2020   [redacted] weeks gestation of pregnancy 06/27/2020   Pregnancy 06/27/2020   Non-reassuring electronic fetal monitoring tracing 06/13/2020   Encounter for supervision of high risk pregnancy due to advanced maternal age in primigravida 11/26/2019   Chronic bilateral low back pain with left-sided sciatica 09/18/2019   Eczema 09/18/2019   Stage 1 chronic kidney disease 02/26/2019   Tinea pedis of both feet 02/26/2019   Tinea corporis 02/26/2019   Prediabetes    VAGINAL DISCHARGE 04/13/2010   ANEMIA 10/27/2008   VAGINITIS, BACTERIAL 10/27/2008   Essential hypertension 06/21/2008   LEG CRAMPS 06/21/2008   ENDOMETRIAL HYPERPLASIA UNSPECIFIED 01/18/2008   Morbid obesity ([place]) 10/02/2007   ECZEMA 10/02/2007   CLUSTER HEADACHE 08/19/2007  DENTAL CARIES 08/19/2007   PCOS (polycystic ovarian syndrome) 05/27/1997   Past Medical History:  Diagnosis Date   Bilateral polycystic ovarian syndrome    Chronic kidney disease    HSV-2 (herpes simplex virus 2) infection    Hypertension 2008   Prediabetes    Vaginal Pap smear, abnormal     Family History  Problem Relation Age of Onset   Heart failure Mother    Diabetes Mother    Hypertension Mother    Breast cancer Mother    Stroke Father        Nursing home bound   Hypertension Father    Dementia Father        from strokes   Other Sister        Prediabetes   Hypertension Sister     Past Surgical History:  Procedure Laterality Date   CESAREAN SECTION N/A 06/27/2020   Procedure: CESAREAN SECTION;   Surgeon: Shivaji, Shanti M, MD;  Location: [facility];  Service: Obstetrics;  Laterality: N/A;   KNEE SURGERY Right 2015   torn cartilage--arthroscopic   MOUTH SURGERY  2016   9 teeth pulled for decay-upper incisors.   Social History   Occupational History   Not on file  Tobacco Use   Smoking status: Never   Smokeless tobacco: Never  Vaping Use   Vaping Use: Never used  Substance and Sexual Activity   Alcohol use: Never   Drug use: Never   Sexual activity: Yes    Birth control/protection: None        

## 2021-11-02 LAB — ANA W/REFLEX: ANA Titer 1: NEGATIVE

## 2021-11-05 ENCOUNTER — Ambulatory Visit: Payer: Medicaid Other | Admitting: Internal Medicine

## 2021-11-05 LAB — RHEUMATOID FACTOR: Rheumatoid fact SerPl-aCnc: <14 IU/mL (ref <14)

## 2021-11-05 LAB — CBC
HCT: 40.2 % (ref 35.0–45.0)
Hemoglobin: 13.3 g/dL (ref 11.7–15.5)
MCH: 29.8 pg (ref 27.0–33.0)
MCHC: 33.1 g/dL (ref 32.0–36.0)
MCV: 90.1 fL (ref 80.0–100.0)
MPV: 13 fL — ABNORMAL HIGH (ref 7.5–12.5)
Platelets: 223 10*3/uL (ref 140–400)
RBC: 4.46 10*6/uL (ref 3.80–5.10)
RDW: 13.4 % (ref 11.0–15.0)
WBC: 6.3 10*3/uL (ref 3.8–10.8)

## 2021-11-05 LAB — URIC ACID: Uric Acid, Serum: 6.7 mg/dL (ref 2.5–7.0)

## 2021-11-05 LAB — ANTI-NUCLEAR AB-TITER (ANA TITER): ANA Titer 1: 1:40 {titer} — ABNORMAL HIGH

## 2021-11-05 LAB — ANA: Anti Nuclear Antibody (ANA): POSITIVE — AB

## 2021-11-05 LAB — SEDIMENTATION RATE: Sed Rate: 38 mm/h — ABNORMAL HIGH (ref 0–20)

## 2021-11-08 DIAGNOSIS — N183 Chronic kidney disease, stage 3 unspecified: Secondary | ICD-10-CM | POA: Diagnosis not present

## 2021-11-08 DIAGNOSIS — M519 Unspecified thoracic, thoracolumbar and lumbosacral intervertebral disc disorder: Secondary | ICD-10-CM | POA: Diagnosis not present

## 2021-11-08 DIAGNOSIS — I10 Essential (primary) hypertension: Secondary | ICD-10-CM | POA: Diagnosis not present

## 2021-11-08 DIAGNOSIS — E282 Polycystic ovarian syndrome: Secondary | ICD-10-CM | POA: Diagnosis not present

## 2021-11-20 ENCOUNTER — Telehealth: Payer: Self-pay | Admitting: Surgery

## 2021-11-24 ENCOUNTER — Other Ambulatory Visit: Payer: Self-pay | Admitting: Internal Medicine

## 2021-12-05 DIAGNOSIS — Z7189 Other specified counseling: Secondary | ICD-10-CM | POA: Diagnosis not present

## 2021-12-05 DIAGNOSIS — F54 Psychological and behavioral factors associated with disorders or diseases classified elsewhere: Secondary | ICD-10-CM | POA: Diagnosis not present

## 2021-12-05 DIAGNOSIS — Z6841 Body Mass Index (BMI) 40.0 and over, adult: Secondary | ICD-10-CM | POA: Diagnosis not present

## 2021-12-10 ENCOUNTER — Telehealth: Payer: Self-pay | Admitting: Surgery

## 2021-12-10 NOTE — Telephone Encounter (Unsigned)
Please call this pt °

## 2021-12-17 NOTE — Telephone Encounter (Signed)
error 

## 2022-01-09 DIAGNOSIS — Z6841 Body Mass Index (BMI) 40.0 and over, adult: Secondary | ICD-10-CM | POA: Diagnosis not present

## 2022-01-09 DIAGNOSIS — I10 Essential (primary) hypertension: Secondary | ICD-10-CM | POA: Diagnosis not present

## 2022-01-10 ENCOUNTER — Other Ambulatory Visit: Payer: Medicaid Other

## 2022-01-10 ENCOUNTER — Telehealth: Payer: Self-pay | Admitting: Surgery

## 2022-01-10 NOTE — Telephone Encounter (Signed)
Please advise patient that I had last seen her in June 2023 and there was supposed to be an appointment made to see Dr. Ophelia Charter 3 weeks after to review blood work and to recheck.  Advised patient that ANA positive and titer is elevated and ANA pattern nuclear, speckled.  Sed rate also elevated.  These labs can be consistent with possible lupus.  She will need a rheumatology referral but she should also come back in to discuss these labs with Dr. Ophelia Charter as I previously had recommended.  Not sure as to why the appointment was not scheduled when she left the office but I know that I had mentioned that to her that the follow-up will be needed.

## 2022-01-10 NOTE — Telephone Encounter (Signed)
You could offer her Tuesday 01/15/2022 morning if you would like. Please tell her that she is being worked in to his schedule and may have to wait. Otherwise, she will have to wait until the next available. His time in the office is limited due to him being scheduled in surgery with Dr. Otelia Sergeant. Thanks.

## 2022-01-11 ENCOUNTER — Other Ambulatory Visit (INDEPENDENT_AMBULATORY_CARE_PROVIDER_SITE_OTHER): Payer: Medicaid Other

## 2022-01-11 DIAGNOSIS — D649 Anemia, unspecified: Secondary | ICD-10-CM

## 2022-01-12 LAB — SEDIMENTATION RATE: Sed Rate: 38 mm/hr — ABNORMAL HIGH (ref 0–32)

## 2022-01-15 ENCOUNTER — Ambulatory Visit (INDEPENDENT_AMBULATORY_CARE_PROVIDER_SITE_OTHER): Payer: Medicaid Other | Admitting: Orthopaedic Surgery

## 2022-01-15 ENCOUNTER — Encounter: Payer: Self-pay | Admitting: Orthopaedic Surgery

## 2022-01-15 VITALS — BP 112/79 | HR 99 | Ht 67.0 in | Wt 286.0 lb

## 2022-01-15 DIAGNOSIS — M25521 Pain in right elbow: Secondary | ICD-10-CM | POA: Diagnosis not present

## 2022-01-15 DIAGNOSIS — G8929 Other chronic pain: Secondary | ICD-10-CM | POA: Diagnosis not present

## 2022-01-15 DIAGNOSIS — M5442 Lumbago with sciatica, left side: Secondary | ICD-10-CM

## 2022-01-15 DIAGNOSIS — M2550 Pain in unspecified joint: Secondary | ICD-10-CM | POA: Diagnosis not present

## 2022-01-15 NOTE — Progress Notes (Unsigned)
Office Visit Note   Patient: Donna Burns           Date of Birth: Jun 24, 1980           MRN: [id number] Visit Date: 01/15/2022              Requested by: Julieanne Manson, MD [address] [place],  [place] [address] PCP: Julieanne Manson, MD   Assessment & Plan: Visit Diagnoses:  1. Chronic bilateral low back pain with left-sided sciatica     Plan: Patient states she never got a phone call about being referred to a rheumatologist to review her labs.  She has a coming appointment to discuss possible bariatric surgery.  We discussed that with weight loss she may get some improvement in her lumbar symptoms.  I reviewed lab work with her she does have stable sedimentation rate that is in the 30s.  ANA mildly elevated at 1:40.  We will make a rheumatology referral patient's request follow-up with me on an as-needed basis.  Follow-Up Instructions: No follow-ups on file.   Orders:  No orders of the defined types were placed in this encounter.  No orders of the defined types were placed in this encounter.     Procedures: No procedures performed   Clinical Data: No additional findings.   Subjective: Chief Complaint  Patient presents with   Right Elbow - Pain   Lower Back - Pain    HPI 41 year old female with history of stage III kidney disease returns for elbow pain which she states somewhat better it was over the distal triceps and olecranon.  No pain over the medial lateral epicondyle and x-rays were negative.  She does have some spinal stenosis at L4-5 and states she is waiting on bariatric procedure in order for her to lose weight so that she could later discussed lumbar surgery with Dr. Otelia Sergeant.  Patient states [place] so that they could assist her to the ambulance.  She had increase in her pain since then more discomfort on the left than right side.  She does have claudication symptoms but gets relief with  sitting.  Problems with prolonged standing.  Current BMI is 44.  Review of Systems all systems noncontributory HPI.   Objective: Vital Signs: BP 112/79   Pulse 99   Ht 5\' 7"  (1.702 m)   Wt 286 lb (129.7 kg)   BMI 44.79 kg/m   Physical Exam Constitutional:      Appearance: She is well-developed.  HENT:     Head: Normocephalic.     Right Ear: External ear normal.     Left Ear: External ear normal. There is no impacted cerumen.  Eyes:     Pupils: Pupils are equal, round, and reactive to light.  Neck:     Thyroid: No thyromegaly.     Trachea: No tracheal deviation.  Cardiovascular:     Rate and Rhythm: Normal rate.  Pulmonary:     Effort: Pulmonary effort is normal.  Abdominal:     Palpations: Abdomen is soft.  Musculoskeletal:     Cervical back: No rigidity.  Skin:    General: Skin is warm and dry.  Neurological:     Mental Status: She is alert and oriented to person, place, and time.  Psychiatric:        Behavior: Behavior normal.     Ortho Exam no tenderness over the medial lateral epicondyles.  No olecranon bursitis distal triceps is normal good triceps strength.  Normal wrist flexion extension no rash over the forearm.  Sensation hand is intact.  Normal heel-toe gait.  Increased BMI.  Specialty Comments:  No specialty comments available.  Imaging: No results found.   PMFS History: Patient Active Problem List   Diagnosis Date Noted   Peripheral edema 11/14/2020   Genital herpes simplex 11/14/2020   [redacted] weeks gestation of pregnancy 06/27/2020   Pregnancy 06/27/2020   Non-reassuring electronic fetal monitoring tracing 06/13/2020   Encounter for supervision of high risk pregnancy due to advanced maternal age in primigravida 11/26/2019   Chronic bilateral low back pain with left-sided sciatica 09/18/2019   Eczema 09/18/2019   Stage 1 chronic kidney disease 02/26/2019   Tinea pedis of both feet 02/26/2019   Tinea corporis 02/26/2019   Prediabetes    VAGINAL  DISCHARGE 04/13/2010   ANEMIA 10/27/2008   VAGINITIS, BACTERIAL 10/27/2008   Essential hypertension 06/21/2008   LEG CRAMPS 06/21/2008   ENDOMETRIAL HYPERPLASIA UNSPECIFIED 01/18/2008   Morbid obesity ([facility]) 10/02/2007   ECZEMA 10/02/2007   CLUSTER HEADACHE 08/19/2007   DENTAL CARIES 08/19/2007   PCOS (polycystic ovarian syndrome) 05/27/1997   Past Medical History:  Diagnosis Date   Bilateral polycystic ovarian syndrome    Chronic kidney disease    HSV-2 (herpes simplex virus 2) infection    Hypertension 2008   Prediabetes    Vaginal Pap smear, abnormal     Family History  Problem Relation Age of Onset   Heart failure Mother    Diabetes Mother    Hypertension Mother    Breast cancer Mother    Stroke Father        Nursing home bound   Hypertension Father    Dementia Father        from strokes   Other Sister        Prediabetes   Hypertension Sister     Past Surgical History:  Procedure Laterality Date   CESAREAN SECTION N/A 06/27/2020   Procedure: CESAREAN SECTION;  Surgeon: Carlisle Cater, MD;  Location: [facility];  Service: Obstetrics;  Laterality: N/A;   KNEE SURGERY Right 2015   torn cartilage--arthroscopic   MOUTH SURGERY  2016   9 teeth pulled for decay-upper incisors.   Social History   Occupational History   Not on file  Tobacco Use   Smoking status: Never   Smokeless tobacco: Never  Vaping Use   Vaping Use: Never used  Substance and Sexual Activity   Alcohol use: Never   Drug use: Never   Sexual activity: Yes    Birth control/protection: None

## 2022-01-30 ENCOUNTER — Ambulatory Visit (INDEPENDENT_AMBULATORY_CARE_PROVIDER_SITE_OTHER): Payer: Medicaid Other | Admitting: Family Medicine

## 2022-01-30 ENCOUNTER — Encounter: Payer: Self-pay | Admitting: Family Medicine

## 2022-01-30 VITALS — BP 115/77 | HR 80 | Temp 97.9°F | Resp 16 | Ht 67.0 in | Wt 289.0 lb

## 2022-01-30 DIAGNOSIS — Z6841 Body Mass Index (BMI) 40.0 and over, adult: Secondary | ICD-10-CM

## 2022-01-30 DIAGNOSIS — I10 Essential (primary) hypertension: Secondary | ICD-10-CM | POA: Diagnosis not present

## 2022-01-30 DIAGNOSIS — M2550 Pain in unspecified joint: Secondary | ICD-10-CM | POA: Insufficient documentation

## 2022-01-30 DIAGNOSIS — M5442 Lumbago with sciatica, left side: Secondary | ICD-10-CM | POA: Diagnosis not present

## 2022-01-30 DIAGNOSIS — Z7689 Persons encountering health services in other specified circumstances: Secondary | ICD-10-CM

## 2022-01-30 DIAGNOSIS — G8929 Other chronic pain: Secondary | ICD-10-CM | POA: Diagnosis not present

## 2022-01-30 DIAGNOSIS — L989 Disorder of the skin and subcutaneous tissue, unspecified: Secondary | ICD-10-CM | POA: Insufficient documentation

## 2022-01-30 DIAGNOSIS — Z3202 Encounter for pregnancy test, result negative: Secondary | ICD-10-CM

## 2022-01-30 DIAGNOSIS — N912 Amenorrhea, unspecified: Secondary | ICD-10-CM | POA: Insufficient documentation

## 2022-01-30 DIAGNOSIS — N926 Irregular menstruation, unspecified: Secondary | ICD-10-CM

## 2022-01-30 DIAGNOSIS — R87619 Unspecified abnormal cytological findings in specimens from cervix uteri: Secondary | ICD-10-CM | POA: Insufficient documentation

## 2022-01-30 DIAGNOSIS — Z713 Dietary counseling and surveillance: Secondary | ICD-10-CM | POA: Diagnosis not present

## 2022-01-30 LAB — POCT URINE PREGNANCY: Preg Test, Ur: NEGATIVE

## 2022-01-30 NOTE — Progress Notes (Signed)
New Patient Office Visit  Subjective    Patient ID: Donna Burns, female    DOB: [birth year]  Age: 41 y.o. MRN: [id number]  CC:  Chief Complaint  Patient presents with   Establish Care    HPI Donna Burns presents to establish care and for review of chronic med issues including hypertension. Patient reports that she is interested in bariatric treatments as she has PCOS, hypertension, CKD 3 and back issues among her chronic med problems.     Outpatient Encounter Medications as of 01/30/2022  Medication Sig   [DISCONTINUED] labetalol (NORMODYNE) 200 MG tablet Take 2 tablets by mouth 2 (two) times daily.   acetaminophen (TYLENOL) 500 MG tablet Take 1,000 mg by mouth every 6 (six) hours as needed for mild pain or headache.   Elastic Bandages & Supports (ELBOW SUPPORT/PRESSURE PADS) MISC M77.12 :  wear nightly with sleep for bilateral lateral epicondylitis   labetalol (NORMODYNE) 200 MG tablet TAKE 1 TABLET(200 MG) BY MOUTH TWICE DAILY   NIFEdipine (ADALAT CC) 90 MG 24 hr tablet TAKE 1 TABLET BY MOUTH DAILY   UNABLE TO FIND Air Cast Armband for bilateral lateral epicondylitis to wear during waking hours   [DISCONTINUED] acetaminophen (TYLENOL) 500 MG tablet Take by mouth.   [DISCONTINUED] meloxicam (MOBIC) 15 MG tablet Take 1 tablet (15 mg total) by mouth daily. Take with food (Patient not taking: Reported on 10/30/2021)   [DISCONTINUED] methylPREDNISolone (MEDROL DOSEPAK) 4 MG TBPK tablet Take as per package instructions (Patient not taking: Reported on 10/30/2021)   [DISCONTINUED] NIFEdipine (ADALAT CC) 90 MG 24 hr tablet Take 1 tablet by mouth daily.   No facility-administered encounter medications on file as of 01/30/2022.    Past Medical History:  Diagnosis Date   Bilateral polycystic ovarian syndrome    Chronic kidney disease    HSV-2 (herpes simplex virus 2) infection    Hypertension 2008   Prediabetes    Vaginal Pap smear, abnormal     Past Surgical History:  Procedure  Laterality Date   CESAREAN SECTION N/A 06/27/2020   Procedure: CESAREAN SECTION;  Surgeon: Carlisle Cater, MD;  Location: [facility];  Service: Obstetrics;  Laterality: N/A;   KNEE SURGERY Right 2015   torn cartilage--arthroscopic   MOUTH SURGERY  2016   9 teeth pulled for decay-upper incisors.    Family History  Problem Relation Age of Onset   Heart failure Mother    Diabetes Mother    Hypertension Mother    Breast cancer Mother    Stroke Father        Nursing home bound   Hypertension Father    Dementia Father        from strokes   Other Sister        Prediabetes   Hypertension Sister     Social History   Socioeconomic History   Marital status: Single    Spouse name: [name]   Number of children: 0   Years of education: Not on file   Highest education level: Bachelor's degree (e.g., BA, AB, BS)  Occupational History   Not on file  Tobacco Use   Smoking status: Never   Smokeless tobacco: Never  Vaping Use   Vaping Use: Never used  Substance and Sexual Activity   Alcohol use: Never   Drug use: Never   Sexual activity: Yes    Birth control/protection: None  Other Topics Concern   Not on file  Social History Narrative   Lives with  husband in [place].   He is a Education officer, environmental   She is a Forensic scientist.   Social Determinants of Health   Financial Resource Strain: Not on file  Food Insecurity: No Food Insecurity (02/26/2019)   Hunger Vital Sign    Worried About Running Out of Food in the Last Year: Never true    Ran Out of Food in the Last Year: Never true  Transportation Needs: No Transportation Needs (02/26/2019)   PRAPARE - Administrator, Civil Service (Medical): No    Lack of Transportation (Non-Medical): No  Physical Activity: Not on file  Stress: Not on file  Social Connections: Not on file  Intimate Partner Violence: Not At Risk (02/26/2019)   Humiliation, Afraid, Rape, and Kick questionnaire    Fear of Current or  Ex-Partner: No    Emotionally Abused: No    Physically Abused: No    Sexually Abused: No    Review of Systems  Musculoskeletal:  Positive for back pain.  All other systems reviewed and are negative.       Objective    BP 115/77   Pulse 80   Temp 97.9 F (36.6 C) (Oral)   Resp 16   Ht 5\' 7"  (1.702 m)   Wt 289 lb (131.1 kg)   SpO2 97%   BMI 45.26 kg/m   Physical Exam Vitals and nursing note reviewed.  Constitutional:      General: She is not in acute distress.    Appearance: She is obese.  Cardiovascular:     Rate and Rhythm: Normal rate and regular rhythm.  Pulmonary:     Effort: Pulmonary effort is normal.     Breath sounds: Normal breath sounds.  Abdominal:     Palpations: Abdomen is soft.     Tenderness: There is no abdominal tenderness.  Neurological:     General: No focal deficit present.     Mental Status: She is alert and oriented to person, place, and time.  Psychiatric:        Mood and Affect: Mood normal.        Behavior: Behavior normal.         Assessment & Plan:   1. Essential hypertension Appears stable. Continue present management and monitor  2. Irregular menstrual cycle Neg pregnancy. Keep scheduled appt with GYN for further eval/mgt - POCT urine pregnancy  3. Encounter for weight management Patient wants to consider bariatric surgery. Continue with bariatric center. Goal is 4 lbs/30 days  4. Class 3 severe obesity due to excess calories with serious comorbidity and body mass index (BMI) of 45.0 to 49.9 in adult [facility]) As above  5. Chronic bilateral low back pain with left-sided sciatica Management per consultant  6. Encounter to establish care    Return in about 4 weeks (around 02/27/2022) for follow up.   04/29/2022, MD

## 2022-01-30 NOTE — Progress Notes (Signed)
Patient is here to established care. Patient has HTN and would like it managing.Patient has no other concern

## 2022-01-31 ENCOUNTER — Ambulatory Visit: Payer: Medicaid Other | Admitting: Internal Medicine

## 2022-02-28 ENCOUNTER — Ambulatory Visit (INDEPENDENT_AMBULATORY_CARE_PROVIDER_SITE_OTHER): Payer: Medicaid Other | Admitting: Family Medicine

## 2022-02-28 VITALS — BP 132/88 | HR 71 | Temp 98.5°F | Resp 16 | Ht 67.0 in | Wt 285.6 lb

## 2022-02-28 DIAGNOSIS — M2550 Pain in unspecified joint: Secondary | ICD-10-CM

## 2022-02-28 DIAGNOSIS — Z6841 Body Mass Index (BMI) 40.0 and over, adult: Secondary | ICD-10-CM | POA: Diagnosis not present

## 2022-02-28 DIAGNOSIS — Z7689 Persons encountering health services in other specified circumstances: Secondary | ICD-10-CM | POA: Diagnosis not present

## 2022-03-01 ENCOUNTER — Encounter: Payer: Self-pay | Admitting: Family Medicine

## 2022-03-01 NOTE — Progress Notes (Signed)
Established Patient Office Visit  Subjective    Patient ID: Donna Burns, female    DOB: 06-05-1980  Age: 41 y.o. MRN: [id number]  CC: No chief complaint on file.   HPI Donna Burns presents for weight management. Patient denies acute complaints or concerns.    Outpatient Encounter Medications as of 02/28/2022  Medication Sig   acetaminophen (TYLENOL) 500 MG tablet Take 1,000 mg by mouth every 6 (six) hours as needed for mild pain or headache.   Elastic Bandages & Supports (ELBOW SUPPORT/PRESSURE PADS) MISC M77.12 :  wear nightly with sleep for bilateral lateral epicondylitis   labetalol (NORMODYNE) 200 MG tablet TAKE 1 TABLET(200 MG) BY MOUTH TWICE DAILY   NIFEdipine (ADALAT CC) 90 MG 24 hr tablet TAKE 1 TABLET BY MOUTH DAILY   UNABLE TO FIND Air Cast Armband for bilateral lateral epicondylitis to wear during waking hours   No facility-administered encounter medications on file as of 02/28/2022.    Past Medical History:  Diagnosis Date   Bilateral polycystic ovarian syndrome    Chronic kidney disease    HSV-2 (herpes simplex virus 2) infection    Hypertension 2008   Prediabetes    Vaginal Pap smear, abnormal     Past Surgical History:  Procedure Laterality Date   CESAREAN SECTION N/A 06/27/2020   Procedure: CESAREAN SECTION;  Surgeon: Carlisle Cater, MD;  Location: [facility];  Service: Obstetrics;  Laterality: N/A;   KNEE SURGERY Right 2015   torn cartilage--arthroscopic   MOUTH SURGERY  2016   9 teeth pulled for decay-upper incisors.    Family History  Problem Relation Age of Onset   Heart failure Mother    Diabetes Mother    Hypertension Mother    Breast cancer Mother    Stroke Father        Nursing home bound   Hypertension Father    Dementia Father        from strokes   Other Sister        Prediabetes   Hypertension Sister     Social History   Socioeconomic History   Marital status: Single    Spouse name: [name]   Number of  children: 0   Years of education: Not on file   Highest education level: Bachelor's degree (e.g., BA, AB, BS)  Occupational History   Not on file  Tobacco Use   Smoking status: Never   Smokeless tobacco: Never  Vaping Use   Vaping Use: Never used  Substance and Sexual Activity   Alcohol use: Never   Drug use: Never   Sexual activity: Yes    Birth control/protection: None  Other Topics Concern   Not on file  Social History Narrative   Lives with husband in [place].   He is a Education officer, environmental   She is a Forensic scientist.   Social Determinants of Health   Financial Resource Strain: Not on file  Food Insecurity: No Food Insecurity (02/26/2019)   Hunger Vital Sign    Worried About Running Out of Food in the Last Year: Never true    Ran Out of Food in the Last Year: Never true  Transportation Needs: No Transportation Needs (02/26/2019)   PRAPARE - Administrator, Civil Service (Medical): No    Lack of Transportation (Non-Medical): No  Physical Activity: Not on file  Stress: Not on file  Social Connections: Not on file  Intimate Partner Violence: Not At Risk (02/26/2019)   Humiliation,  Afraid, Rape, and Kick questionnaire    Fear of Current or Ex-Partner: No    Emotionally Abused: No    Physically Abused: No    Sexually Abused: No    Review of Systems  All other systems reviewed and are negative.       Objective    BP 132/88   Pulse 71   Temp 98.5 F (36.9 C) (Oral)   Resp 16   Ht 5\' 7"  (1.702 m)   Wt 285 lb 9.6 oz (129.5 kg)   SpO2 96%   BMI 44.73 kg/m   Physical Exam Vitals and nursing note reviewed.  Constitutional:      General: She is not in acute distress.    Appearance: She is obese.  Cardiovascular:     Rate and Rhythm: Normal rate and regular rhythm.  Pulmonary:     Effort: Pulmonary effort is normal.     Breath sounds: Normal breath sounds.  Neurological:     General: No focal deficit present.     Mental Status: She is alert  and oriented to person, place, and time.         Assessment & Plan:   1. Encounter for weight management Discussed dietary and activity options. Goal is 3-5lbs/mo wt loss. Continue management as per [facility] consultant  2. Class 3 severe obesity due to excess calories with serious comorbidity and body mass index (BMI) of 45.0 to 49.9 in adult [facility])     Return in about 4 weeks (around 03/28/2022) for follow up.   Becky Sax, MD

## 2022-03-06 DIAGNOSIS — I10 Essential (primary) hypertension: Secondary | ICD-10-CM | POA: Diagnosis not present

## 2022-03-06 DIAGNOSIS — D649 Anemia, unspecified: Secondary | ICD-10-CM | POA: Diagnosis not present

## 2022-03-06 DIAGNOSIS — Z01818 Encounter for other preprocedural examination: Secondary | ICD-10-CM | POA: Diagnosis not present

## 2022-03-06 DIAGNOSIS — E663 Overweight: Secondary | ICD-10-CM | POA: Diagnosis not present

## 2022-03-06 DIAGNOSIS — Z713 Dietary counseling and surveillance: Secondary | ICD-10-CM | POA: Diagnosis not present

## 2022-03-07 ENCOUNTER — Telehealth: Payer: Self-pay | Admitting: Family Medicine

## 2022-03-07 NOTE — Telephone Encounter (Signed)
Pt had appt 02/28/22  and weight management was discussed. Pt is now dropping off form to be completed for Bariatric Surgery Support Letter  to be completed by PCP  and faxed to [phone] when possible. Form in provider bin.

## 2022-03-08 NOTE — Telephone Encounter (Signed)
Provider has letter in her folder

## 2022-03-11 DIAGNOSIS — Z136 Encounter for screening for cardiovascular disorders: Secondary | ICD-10-CM | POA: Diagnosis not present

## 2022-03-11 DIAGNOSIS — Z6841 Body Mass Index (BMI) 40.0 and over, adult: Secondary | ICD-10-CM | POA: Diagnosis not present

## 2022-03-11 DIAGNOSIS — I10 Essential (primary) hypertension: Secondary | ICD-10-CM | POA: Diagnosis not present

## 2022-03-19 DIAGNOSIS — Z01818 Encounter for other preprocedural examination: Secondary | ICD-10-CM | POA: Diagnosis not present

## 2022-03-19 DIAGNOSIS — N183 Chronic kidney disease, stage 3 unspecified: Secondary | ICD-10-CM | POA: Diagnosis not present

## 2022-03-19 DIAGNOSIS — E282 Polycystic ovarian syndrome: Secondary | ICD-10-CM | POA: Diagnosis not present

## 2022-03-19 DIAGNOSIS — I10 Essential (primary) hypertension: Secondary | ICD-10-CM | POA: Diagnosis not present

## 2022-03-28 ENCOUNTER — Ambulatory Visit: Payer: Medicaid Other | Admitting: Family Medicine

## 2022-04-15 DIAGNOSIS — E663 Overweight: Secondary | ICD-10-CM | POA: Insufficient documentation

## 2022-04-15 DIAGNOSIS — Z87898 Personal history of other specified conditions: Secondary | ICD-10-CM | POA: Insufficient documentation

## 2022-04-25 ENCOUNTER — Ambulatory Visit: Payer: Medicaid Other | Admitting: Family Medicine

## 2022-05-09 DIAGNOSIS — I10 Essential (primary) hypertension: Secondary | ICD-10-CM | POA: Diagnosis not present

## 2022-05-09 DIAGNOSIS — E282 Polycystic ovarian syndrome: Secondary | ICD-10-CM | POA: Diagnosis not present

## 2022-05-09 DIAGNOSIS — N183 Chronic kidney disease, stage 3 unspecified: Secondary | ICD-10-CM | POA: Diagnosis not present

## 2022-05-09 DIAGNOSIS — Z01818 Encounter for other preprocedural examination: Secondary | ICD-10-CM | POA: Diagnosis not present

## 2022-06-03 DIAGNOSIS — Z9884 Bariatric surgery status: Secondary | ICD-10-CM | POA: Insufficient documentation

## 2022-06-03 DIAGNOSIS — I10 Essential (primary) hypertension: Secondary | ICD-10-CM | POA: Diagnosis not present

## 2022-06-03 DIAGNOSIS — Z6841 Body Mass Index (BMI) 40.0 and over, adult: Secondary | ICD-10-CM | POA: Diagnosis not present

## 2022-06-03 HISTORY — PX: BARIATRIC SURGERY: SHX1103

## 2022-06-27 DIAGNOSIS — N183 Chronic kidney disease, stage 3 unspecified: Secondary | ICD-10-CM | POA: Diagnosis not present

## 2022-07-02 DIAGNOSIS — I129 Hypertensive chronic kidney disease with stage 1 through stage 4 chronic kidney disease, or unspecified chronic kidney disease: Secondary | ICD-10-CM | POA: Diagnosis not present

## 2022-07-02 DIAGNOSIS — N183 Chronic kidney disease, stage 3 unspecified: Secondary | ICD-10-CM | POA: Diagnosis not present

## 2022-07-02 DIAGNOSIS — Z903 Acquired absence of stomach [part of]: Secondary | ICD-10-CM | POA: Diagnosis not present

## 2022-07-08 DIAGNOSIS — Z713 Dietary counseling and surveillance: Secondary | ICD-10-CM | POA: Diagnosis not present

## 2022-07-15 ENCOUNTER — Ambulatory Visit: Payer: Medicaid Other | Admitting: Family Medicine

## 2022-07-18 ENCOUNTER — Encounter: Payer: Self-pay | Admitting: Family Medicine

## 2022-07-18 ENCOUNTER — Ambulatory Visit (INDEPENDENT_AMBULATORY_CARE_PROVIDER_SITE_OTHER): Payer: Medicaid Other | Admitting: Family Medicine

## 2022-07-18 VITALS — BP 115/76 | HR 73 | Temp 98.1°F | Resp 16 | Wt 255.4 lb

## 2022-07-18 DIAGNOSIS — Z6841 Body Mass Index (BMI) 40.0 and over, adult: Secondary | ICD-10-CM | POA: Diagnosis not present

## 2022-07-18 DIAGNOSIS — Z7689 Persons encountering health services in other specified circumstances: Secondary | ICD-10-CM | POA: Diagnosis not present

## 2022-07-18 DIAGNOSIS — I10 Essential (primary) hypertension: Secondary | ICD-10-CM
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
## 2022-07-18 NOTE — Progress Notes (Signed)
Patient is here for their 3 month follow-up Patient has no concerns today Care gaps have been discussed with patient

## 2022-07-19 ENCOUNTER — Encounter: Payer: Self-pay | Admitting: Family Medicine

## 2022-07-19 NOTE — Progress Notes (Signed)
Established Patient Office Visit  Subjective    Patient ID: Donna Burns, female    DOB: August 23, 1980  Age: 42 y.o. MRN: [id number]  CC:  Chief Complaint  Patient presents with   Follow-up    HPI Donna Burns presents for routine follow up of chronic med issues. Patient denies acute complaints or concerns.    Outpatient Encounter Medications as of 07/18/2022  Medication Sig   aprepitant (EMEND) 40 MG capsule Take 40 mg by mouth every morning.   calcium citrate (CALCITRATE - DOSED IN MG ELEMENTAL CALCIUM) 950 (200 Ca) MG tablet Take 1,200 mg of elemental calcium by mouth daily.   cholecalciferol (VITAMIN D3) 25 MCG (1000 UNIT) tablet Take 1,000 Units by mouth daily.   levofloxacin (LEVAQUIN) 500 MG tablet Take by mouth.   omeprazole (PRILOSEC) 40 MG capsule Take by mouth.   acetaminophen (TYLENOL) 500 MG tablet Take 1,000 mg by mouth every 6 (six) hours as needed for mild pain or headache.   Elastic Bandages & Supports (ELBOW SUPPORT/PRESSURE PADS) MISC M77.12 :  wear nightly with sleep for bilateral lateral epicondylitis   labetalol (NORMODYNE) 200 MG tablet TAKE 1 TABLET(200 MG) BY MOUTH TWICE DAILY   metoCLOPramide (REGLAN) 10 MG tablet Take by mouth.   NIFEdipine (ADALAT CC) 90 MG 24 hr tablet TAKE 1 TABLET BY MOUTH DAILY   UNABLE TO FIND Air Cast Armband for bilateral lateral epicondylitis to wear during waking hours   ursodiol (ACTIGALL) 300 MG capsule Take 300 mg by mouth 2 (two) times daily.   No facility-administered encounter medications on file as of 07/18/2022.    Past Medical History:  Diagnosis Date   Bilateral polycystic ovarian syndrome    Chronic kidney disease    HSV-2 (herpes simplex virus 2) infection    Hypertension 2008   Prediabetes    Vaginal Pap smear, abnormal     Past Surgical History:  Procedure Laterality Date   CESAREAN SECTION N/A 06/27/2020   Procedure: CESAREAN SECTION;  Surgeon: Deliah Boston, MD;  Location: [facility] LD ORS;  Service:  Obstetrics;  Laterality: N/A;   KNEE SURGERY Right 2015   torn cartilage--arthroscopic   MOUTH SURGERY  2016   9 teeth pulled for decay-upper incisors.    Family History  Problem Relation Age of Onset   Heart failure Mother    Diabetes Mother    Hypertension Mother    Breast cancer Mother    Stroke Father        Nursing home bound   Hypertension Father    Dementia Father        from strokes   Other Sister        Prediabetes   Hypertension Sister     Social History   Socioeconomic History   Marital status: Single    Spouse name: [name]   Number of children: 0   Years of education: Not on file   Highest education level: Bachelor's degree (e.g., BA, AB, BS)  Occupational History   Not on file  Tobacco Use   Smoking status: Never   Smokeless tobacco: Never  Vaping Use   Vaping Use: Never used  Substance and Sexual Activity   Alcohol use: Never   Drug use: Never   Sexual activity: Yes    Birth control/protection: None  Other Topics Concern   Not on file  Social History Narrative   Lives with husband in [place].   He is a Conservator, museum/gallery   She is a  Social research officer, government.   Social Determinants of Health   Financial Resource Strain: Not on file  Food Insecurity: No Food Insecurity (02/26/2019)   Hunger Vital Sign    Worried About Running Out of Food in the Last Year: Never true    Ran Out of Food in the Last Year: Never true  Transportation Needs: No Transportation Needs (02/26/2019)   PRAPARE - Hydrologist (Medical): No    Lack of Transportation (Non-Medical): No  Physical Activity: Not on file  Stress: Not on file  Social Connections: Not on file  Intimate Partner Violence: Not At Risk (02/26/2019)   Humiliation, Afraid, Rape, and Kick questionnaire    Fear of Current or Ex-Partner: No    Emotionally Abused: No    Physically Abused: No    Sexually Abused: No    Review of Systems  All other systems reviewed and  are negative.       Objective    BP 115/76   Pulse 73   Temp 98.1 F (36.7 C) (Oral)   Resp 16   Wt 255 lb 6.4 oz (115.8 kg)   SpO2 96%   BMI 40.00 kg/m   Physical Exam Vitals and nursing note reviewed.  Constitutional:      General: She is not in acute distress.    Appearance: She is obese.  Cardiovascular:     Rate and Rhythm: Normal rate and regular rhythm.  Pulmonary:     Effort: Pulmonary effort is normal.     Breath sounds: Normal breath sounds.  Neurological:     General: No focal deficit present.     Mental Status: She is alert and oriented to person, place, and time.         Assessment & Plan:   1. Encounter for weight management Patient reports recent bariatric surgery and doing well. Management per consultant.   2. Class 3 severe obesity due to excess calories with serious comorbidity and body mass index (BMI) of 40.0 to 44.9 in adult [facility]) As above  3. Essential hypertension Appears stable. Continue     Return in about 6 months (around 01/16/2023) for follow up.   Becky Sax, MD

## 2022-07-26 NOTE — Progress Notes (Signed)
Closing bp check note

## 2022-08-01 ENCOUNTER — Encounter: Payer: Self-pay | Admitting: Rheumatology

## 2022-08-01 ENCOUNTER — Ambulatory Visit (INDEPENDENT_AMBULATORY_CARE_PROVIDER_SITE_OTHER): Payer: Medicaid Other

## 2022-08-01 ENCOUNTER — Ambulatory Visit: Payer: Medicaid Other

## 2022-08-01 ENCOUNTER — Ambulatory Visit: Payer: Medicaid Other | Attending: Rheumatology | Admitting: Rheumatology

## 2022-08-01 VITALS — BP 108/75 | HR 73 | Resp 16 | Ht 67.0 in | Wt 246.8 lb

## 2022-08-01 DIAGNOSIS — Z903 Acquired absence of stomach [part of]: Secondary | ICD-10-CM | POA: Diagnosis not present

## 2022-08-01 DIAGNOSIS — M2550 Pain in unspecified joint: Secondary | ICD-10-CM | POA: Diagnosis not present

## 2022-08-01 DIAGNOSIS — B353 Tinea pedis: Secondary | ICD-10-CM

## 2022-08-01 DIAGNOSIS — R5383 Other fatigue: Secondary | ICD-10-CM

## 2022-08-01 DIAGNOSIS — M25562 Pain in left knee: Secondary | ICD-10-CM

## 2022-08-01 DIAGNOSIS — M79671 Pain in right foot: Secondary | ICD-10-CM | POA: Diagnosis not present

## 2022-08-01 DIAGNOSIS — M79642 Pain in left hand: Secondary | ICD-10-CM | POA: Diagnosis not present

## 2022-08-01 DIAGNOSIS — M5442 Lumbago with sciatica, left side: Secondary | ICD-10-CM

## 2022-08-01 DIAGNOSIS — E282 Polycystic ovarian syndrome: Secondary | ICD-10-CM

## 2022-08-01 DIAGNOSIS — M25521 Pain in right elbow: Secondary | ICD-10-CM

## 2022-08-01 DIAGNOSIS — R7303 Prediabetes: Secondary | ICD-10-CM | POA: Diagnosis not present

## 2022-08-01 DIAGNOSIS — R768 Other specified abnormal immunological findings in serum: Secondary | ICD-10-CM | POA: Diagnosis not present

## 2022-08-01 DIAGNOSIS — A6000 Herpesviral infection of urogenital system, unspecified: Secondary | ICD-10-CM

## 2022-08-01 DIAGNOSIS — N1831 Chronic kidney disease, stage 3a: Secondary | ICD-10-CM | POA: Diagnosis not present

## 2022-08-01 DIAGNOSIS — I10 Essential (primary) hypertension: Secondary | ICD-10-CM | POA: Diagnosis not present

## 2022-08-01 DIAGNOSIS — M79641 Pain in right hand: Secondary | ICD-10-CM

## 2022-08-01 DIAGNOSIS — M25561 Pain in right knee: Secondary | ICD-10-CM

## 2022-08-01 DIAGNOSIS — M79672 Pain in left foot: Secondary | ICD-10-CM | POA: Diagnosis not present

## 2022-08-01 DIAGNOSIS — N181 Chronic kidney disease, stage 1: Secondary | ICD-10-CM

## 2022-08-01 DIAGNOSIS — B354 Tinea corporis: Secondary | ICD-10-CM | POA: Diagnosis not present

## 2022-08-01 DIAGNOSIS — Z8261 Family history of arthritis: Secondary | ICD-10-CM

## 2022-08-01 DIAGNOSIS — G8929 Other chronic pain: Secondary | ICD-10-CM

## 2022-08-01 DIAGNOSIS — R7689 Other specified abnormal immunological findings in serum: Secondary | ICD-10-CM

## 2022-08-01 DIAGNOSIS — L309 Dermatitis, unspecified: Secondary | ICD-10-CM

## 2022-08-04 NOTE — Progress Notes (Signed)
Creatinine is elevated and stable.  Calcium is elevated.  Patient should avoid calcium supplement.  CK is mildly elevated.  All other autoimmune labs are negative.  Repeat sedimentation rate is mildly elevated and improved.  Will discuss results at the follow-up visit.  Please forward results to her PCP and nephrologist.

## 2022-08-08 LAB — SEDIMENTATION RATE: Sed Rate: 34 mm/h — ABNORMAL HIGH (ref 0–20)

## 2022-08-08 LAB — CBC WITH DIFFERENTIAL/PLATELET
Absolute Monocytes: 312 cells/uL (ref 200–950)
Basophils Absolute: 40 cells/uL (ref 0–200)
Basophils Relative: 0.9 %
Eosinophils Absolute: 141 cells/uL (ref 15–500)
Eosinophils Relative: 3.2 %
HCT: 37.2 % (ref 35.0–45.0)
Hemoglobin: 12.2 g/dL (ref 11.7–15.5)
Lymphs Abs: 1870 cells/uL (ref 850–3900)
MCH: 28.8 pg (ref 27.0–33.0)
MCHC: 32.8 g/dL (ref 32.0–36.0)
MCV: 87.9 fL (ref 80.0–100.0)
MPV: 14 fL — ABNORMAL HIGH (ref 7.5–12.5)
Monocytes Relative: 7.1 %
Neutro Abs: 2037 cells/uL (ref 1500–7800)
Neutrophils Relative %: 46.3 %
Platelets: 190 10*3/uL (ref 140–400)
RBC: 4.23 10*6/uL (ref 3.80–5.10)
RDW: 14.6 % (ref 11.0–15.0)
Total Lymphocyte: 42.5 %
WBC: 4.4 10*3/uL (ref 3.8–10.8)

## 2022-08-08 LAB — RNP ANTIBODY: Ribonucleic Protein(ENA) Antibody, IgG: <1 AI

## 2022-08-08 LAB — COMPLETE METABOLIC PANEL WITH GFR
AG Ratio: 1.2 (calc) (ref 1.0–2.5)
ALT: 9 U/L (ref 6–29)
AST: 16 U/L (ref 10–30)
Albumin: 4.2 g/dL (ref 3.6–5.1)
Alkaline phosphatase (APISO): 49 U/L (ref 31–125)
BUN/Creatinine Ratio: 13 (calc) (ref 6–22)
BUN: 18 mg/dL (ref 7–25)
CO2: 25 mmol/L (ref 20–32)
Calcium: 10.4 mg/dL — ABNORMAL HIGH (ref 8.6–10.2)
Chloride: 109 mmol/L (ref 98–110)
Creat: 1.34 mg/dL — ABNORMAL HIGH (ref 0.50–0.99)
Globulin: 3.4 g/dL (calc) (ref 1.9–3.7)
Glucose, Bld: 89 mg/dL (ref 65–99)
Potassium: 4.6 mmol/L (ref 3.5–5.3)
Sodium: 141 mmol/L (ref 135–146)
Total Bilirubin: 0.3 mg/dL (ref 0.2–1.2)
Total Protein: 7.6 g/dL (ref 6.1–8.1)
eGFR: 50 mL/min/{1.73_m2} — ABNORMAL LOW (ref >=60)

## 2022-08-08 LAB — C3 AND C4
C3 Complement: 170 mg/dL (ref 83–193)
C4 Complement: 38 mg/dL (ref 15–57)

## 2022-08-08 LAB — ANTI-DNA ANTIBODY, DOUBLE-STRANDED: ds DNA Ab: <1 IU/mL

## 2022-08-08 LAB — SJOGRENS SYNDROME-B EXTRACTABLE NUCLEAR ANTIBODY: SSB (La) (ENA) Antibody, IgG: <1 AI

## 2022-08-08 LAB — ANA: Anti Nuclear Antibody (ANA): NEGATIVE

## 2022-08-08 LAB — RHEUMATOID FACTOR: Rheumatoid fact SerPl-aCnc: <14 IU/mL (ref <14)

## 2022-08-08 LAB — CYCLIC CITRUL PEPTIDE ANTIBODY, IGG: Cyclic Citrullin Peptide Ab: <16 UNITS

## 2022-08-08 LAB — ANTI-SMITH ANTIBODY: ENA SM Ab Ser-aCnc: <1 AI

## 2022-08-08 LAB — GLUCOSE 6 PHOSPHATE DEHYDROGENASE: G-6PDH: 16.2 U/g Hgb (ref 7.0–20.5)

## 2022-08-08 LAB — SJOGRENS SYNDROME-A EXTRACTABLE NUCLEAR ANTIBODY: SSA (Ro) (ENA) Antibody, IgG: <1 AI

## 2022-08-08 LAB — CK: Total CK: 262 U/L — ABNORMAL HIGH (ref 29–143)
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
## 2022-08-22 ENCOUNTER — Ambulatory Visit: Payer: Medicaid Other | Admitting: Rheumatology

## 2022-08-28 ENCOUNTER — Ambulatory Visit: Payer: Medicaid Other | Attending: Rheumatology | Admitting: Rheumatology

## 2022-08-28 ENCOUNTER — Encounter: Payer: Self-pay | Admitting: Rheumatology

## 2022-08-28 VITALS — BP 112/74 | HR 68 | Resp 15 | Ht 67.0 in | Wt 241.0 lb

## 2022-08-28 DIAGNOSIS — I10 Essential (primary) hypertension: Secondary | ICD-10-CM | POA: Diagnosis not present

## 2022-08-28 DIAGNOSIS — M25561 Pain in right knee: Secondary | ICD-10-CM

## 2022-08-28 DIAGNOSIS — Z903 Acquired absence of stomach [part of]: Secondary | ICD-10-CM

## 2022-08-28 DIAGNOSIS — M25521 Pain in right elbow: Secondary | ICD-10-CM

## 2022-08-28 DIAGNOSIS — N1831 Chronic kidney disease, stage 3a: Secondary | ICD-10-CM | POA: Diagnosis not present

## 2022-08-28 DIAGNOSIS — M79641 Pain in right hand: Secondary | ICD-10-CM

## 2022-08-28 DIAGNOSIS — M79642 Pain in left hand: Secondary | ICD-10-CM

## 2022-08-28 DIAGNOSIS — M79672 Pain in left foot: Secondary | ICD-10-CM

## 2022-08-28 DIAGNOSIS — Z8261 Family history of arthritis: Secondary | ICD-10-CM | POA: Diagnosis not present

## 2022-08-28 DIAGNOSIS — R7303 Prediabetes: Secondary | ICD-10-CM

## 2022-08-28 DIAGNOSIS — L309 Dermatitis, unspecified: Secondary | ICD-10-CM

## 2022-08-28 DIAGNOSIS — E282 Polycystic ovarian syndrome: Secondary | ICD-10-CM

## 2022-08-28 DIAGNOSIS — M2550 Pain in unspecified joint: Secondary | ICD-10-CM | POA: Diagnosis not present

## 2022-08-28 DIAGNOSIS — M5136 Other intervertebral disc degeneration, lumbar region: Secondary | ICD-10-CM | POA: Diagnosis not present

## 2022-08-28 DIAGNOSIS — B354 Tinea corporis: Secondary | ICD-10-CM

## 2022-08-28 DIAGNOSIS — M79671 Pain in right foot: Secondary | ICD-10-CM

## 2022-08-28 DIAGNOSIS — B353 Tinea pedis: Secondary | ICD-10-CM

## 2022-08-28 DIAGNOSIS — R768 Other specified abnormal immunological findings in serum: Secondary | ICD-10-CM | POA: Diagnosis not present

## 2022-08-28 DIAGNOSIS — A6000 Herpesviral infection of urogenital system, unspecified: Secondary | ICD-10-CM

## 2022-08-28 DIAGNOSIS — G8929 Other chronic pain: Secondary | ICD-10-CM

## 2022-08-28 DIAGNOSIS — M25562 Pain in left knee: Secondary | ICD-10-CM

## 2022-08-28 NOTE — Patient Instructions (Signed)

## 2022-08-29 DIAGNOSIS — M549 Dorsalgia, unspecified: Secondary | ICD-10-CM | POA: Diagnosis not present

## 2022-08-29 DIAGNOSIS — Z01419 Encounter for gynecological examination (general) (routine) without abnormal findings: Secondary | ICD-10-CM | POA: Diagnosis not present

## 2022-08-29 DIAGNOSIS — N189 Chronic kidney disease, unspecified: Secondary | ICD-10-CM | POA: Diagnosis not present

## 2022-08-29 DIAGNOSIS — Z9884 Bariatric surgery status: Secondary | ICD-10-CM | POA: Diagnosis not present

## 2022-08-29 DIAGNOSIS — Z1231 Encounter for screening mammogram for malignant neoplasm of breast: Secondary | ICD-10-CM | POA: Diagnosis not present

## 2022-10-10 DIAGNOSIS — Z713 Dietary counseling and surveillance: Secondary | ICD-10-CM | POA: Diagnosis not present

## 2022-10-22 ENCOUNTER — Ambulatory Visit (INDEPENDENT_AMBULATORY_CARE_PROVIDER_SITE_OTHER): Payer: Medicaid Other | Admitting: Orthopaedic Surgery

## 2022-10-22 ENCOUNTER — Other Ambulatory Visit: Payer: Self-pay

## 2022-10-22 ENCOUNTER — Encounter: Payer: Self-pay | Admitting: Orthopaedic Surgery

## 2022-10-22 VITALS — BP 124/77 | Ht 67.0 in | Wt 235.0 lb

## 2022-10-22 DIAGNOSIS — G8929 Other chronic pain: Secondary | ICD-10-CM

## 2022-10-22 DIAGNOSIS — M5442 Lumbago with sciatica, left side: Secondary | ICD-10-CM
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
## 2022-11-03 ENCOUNTER — Other Ambulatory Visit: Payer: Self-pay | Admitting: Internal Medicine

## 2022-11-20 ENCOUNTER — Other Ambulatory Visit: Payer: Self-pay | Admitting: Family Medicine

## 2022-11-20 NOTE — Addendum Note (Signed)
Addended by: Nena Polio on: 11/20/2022 09:04 AM   Modules accepted: Orders

## 2022-11-20 NOTE — Telephone Encounter (Signed)
Medication Refill - Medication: labetalol (NORMODYNE) 200 MG tablet  and  NIFEdipine (ADALAT CC) 90 MG 24 hr tablet   Has the patient contacted their pharmacy? Yes   Preferred Pharmacy (with phone number or street name):  [id number] [place], [place] - [address] AT [facility] [address]  [address] [place] [address]  Phone: [phone] Fax: [phone]  Hours: Not open 24 hours      Has the patient been seen for an appointment in the last year OR does the patient have an upcoming appointment? Yes.    Agent: Please be advised that RX refills may take up to 3 business days. We ask that you follow-up with your pharmacy.

## 2022-11-21 NOTE — Telephone Encounter (Signed)
Requested medication (s) are due for refill today: yes  Requested medication (s) are on the active medication list: yes  Last refill:  10/14/21  Future visit scheduled: yes  Notes to clinic:  Unable to refill per protocol, last refill by another provider. Routing for approval from PCP, future OV scheduled.     Requested Prescriptions  Pending Prescriptions Disp Refills   labetalol (NORMODYNE) 200 MG tablet 60 tablet 7     Cardiovascular:  Beta Blockers Passed - 11/20/2022  9:04 AM      Passed - Last BP in normal range    BP Readings from Last 1 Encounters:  10/22/22 124/77         Passed - Last Heart Rate in normal range    Pulse Readings from Last 1 Encounters:  08/28/22 68         Passed - Valid encounter within last 6 months    Recent Outpatient Visits           4 months ago Encounter for weight management   [facility], MD   8 months ago Encounter for weight management   [facility] MD   9 months ago Essential hypertension   [facility], MD       Future Appointments             In 1 month Georganna Skeans, MD [facility]   In 2 months Eldred Manges, MD [facility]             NIFEdipine (ADALAT CC) 90 MG 24 hr tablet 30 tablet 11    Sig: Take 1 tablet (90 mg total) by mouth daily.     Cardiovascular: Calcium Channel Blockers 2 Passed - 11/20/2022  9:04 AM      Passed - Last BP in normal range    BP Readings from Last 1 Encounters:  10/22/22 124/77         Passed - Last Heart Rate in normal range    Pulse Readings from Last 1 Encounters:  08/28/22 68         Passed - Valid encounter within last 6 months    Recent Outpatient Visits           4 months ago Encounter for weight management   [facility], MD   8 months  ago Encounter for weight management   [facility], MD   9 months ago Essential hypertension   [facility], MD       Future Appointments             In 1 month Georganna Skeans, MD [facility]   In 2 months Eldred Manges, MD [facility]

## 2022-12-04 ENCOUNTER — Other Ambulatory Visit: Payer: Self-pay | Admitting: *Deleted

## 2022-12-04 ENCOUNTER — Telehealth: Payer: Self-pay | Admitting: Family Medicine

## 2022-12-04 ENCOUNTER — Other Ambulatory Visit: Payer: Self-pay | Admitting: Internal Medicine

## 2022-12-04 NOTE — Telephone Encounter (Signed)
[facility] called in error, medication refill request routed to provider in a separate encounter today.

## 2022-12-04 NOTE — Telephone Encounter (Signed)
Patient called [facility] did not have the script for NIFEdipine (ADALAT CC) 90 MG 24 hr tablet however it show the script was recvd by pharmacy on 7/5 at 9:51am. Pharmacy is suppose to be sending a refill request

## 2022-12-04 NOTE — Telephone Encounter (Signed)
Requested medication (s) are due for refill today: Yes  Requested medication (s) are on the active medication list: Yes  Last refill:  10/14/21  Future visit scheduled: Yes  Notes to clinic:  Unable to refill per protocol, last refill by another provider. Patient is out of medication.     Requested Prescriptions  Pending Prescriptions Disp Refills   labetalol (NORMODYNE) 200 MG tablet 60 tablet 7     Cardiovascular:  Beta Blockers Passed - 12/04/2022  5:03 PM      Passed - Last BP in normal range    BP Readings from Last 1 Encounters:  10/22/22 124/77         Passed - Last Heart Rate in normal range    Pulse Readings from Last 1 Encounters:  08/28/22 68         Passed - Valid encounter within last 6 months    Recent Outpatient Visits           4 months ago Encounter for weight management   [place] [facility], MD   9 months ago Encounter for weight management   [place] [facility], MD   10 months ago Essential hypertension   [place] [facility], MD       Future Appointments             In 1 month Georganna Skeans, MD [facility]   In 1 month Eldred Manges, MD [facility]             NIFEdipine (ADALAT CC) 90 MG 24 hr tablet 30 tablet 11    Sig: Take 1 tablet (90 mg total) by mouth daily.     Cardiovascular: Calcium Channel Blockers 2 Passed - 12/04/2022  5:03 PM      Passed - Last BP in normal range    BP Readings from Last 1 Encounters:  10/22/22 124/77         Passed - Last Heart Rate in normal range    Pulse Readings from Last 1 Encounters:  08/28/22 68         Passed - Valid encounter within last 6 months    Recent Outpatient Visits           4 months ago Encounter for weight management   [place] [facility], MD   9 months ago Encounter for  weight management   [place] [facility], MD   10 months ago Essential hypertension   [place] [facility], MD       Future Appointments             In 1 month Georganna Skeans, MD [facility]   In 1 month Eldred Manges, MD [facility]

## 2022-12-04 NOTE — Telephone Encounter (Signed)
Patient called from pharmacy requesting refill of medications requested on 11/21/22. Labetalol 200 mg  and nifedipine 90 mg pending refills. Please advise . Last OV 07/18/22 future visit in 01/16/23 patient out of medication nifedipine.

## 2022-12-05 DIAGNOSIS — K912 Postsurgical malabsorption, not elsewhere classified: Secondary | ICD-10-CM | POA: Diagnosis not present

## 2022-12-05 DIAGNOSIS — Z903 Acquired absence of stomach [part of]: Secondary | ICD-10-CM | POA: Diagnosis not present

## 2022-12-10 DIAGNOSIS — Z903 Acquired absence of stomach [part of]: Secondary | ICD-10-CM | POA: Diagnosis not present

## 2022-12-10 DIAGNOSIS — N289 Disorder of kidney and ureter, unspecified: Secondary | ICD-10-CM | POA: Diagnosis not present

## 2022-12-10 DIAGNOSIS — D509 Iron deficiency anemia, unspecified: Secondary | ICD-10-CM | POA: Diagnosis not present

## 2022-12-10 DIAGNOSIS — Z6836 Body mass index (BMI) 36.0-36.9, adult: Secondary | ICD-10-CM | POA: Diagnosis not present

## 2022-12-10 DIAGNOSIS — I10 Essential (primary) hypertension: Secondary | ICD-10-CM | POA: Diagnosis not present

## 2022-12-10 DIAGNOSIS — Z87898 Personal history of other specified conditions: Secondary | ICD-10-CM | POA: Diagnosis not present

## 2022-12-12 ENCOUNTER — Encounter: Payer: Self-pay | Admitting: Family Medicine

## 2022-12-12 ENCOUNTER — Ambulatory Visit (INDEPENDENT_AMBULATORY_CARE_PROVIDER_SITE_OTHER): Payer: Medicaid Other | Admitting: Family Medicine

## 2022-12-12 ENCOUNTER — Other Ambulatory Visit: Payer: Self-pay | Admitting: Family Medicine

## 2022-12-12 VITALS — BP 150/99 | HR 77 | Temp 98.1°F | Resp 16 | Wt 234.0 lb

## 2022-12-12 DIAGNOSIS — Z6836 Body mass index (BMI) 36.0-36.9, adult: Secondary | ICD-10-CM | POA: Diagnosis not present

## 2022-12-12 DIAGNOSIS — I10 Essential (primary) hypertension: Secondary | ICD-10-CM | POA: Diagnosis not present

## 2022-12-12 MED ORDER — NIFEDIPINE ER 90 MG PO TB24: 90.0000 mg | ORAL_TABLET | Freq: Every day | ORAL | 1 refills | Status: DC

## 2022-12-12 MED ORDER — LABETALOL HCL 200 MG PO TABS: 200.0000 mg | ORAL_TABLET | Freq: Two times a day (BID) | ORAL | 1 refills | Status: DC

## 2022-12-12 NOTE — Progress Notes (Signed)
Patient is here for follow-up BP Patent has uncontrolled hypertension Provider is  aware of readings

## 2022-12-13 ENCOUNTER — Encounter: Payer: Self-pay | Admitting: Family Medicine

## 2022-12-13 NOTE — Progress Notes (Signed)
Established Patient Office Visit  Subjective    Patient ID: Donna Burns, female    DOB: 04/22/1981  Age: 42 y.o. MRN: [id number]  CC: No chief complaint on file.   HPI Donna Burns presents for follow up of hypertension. Patient denies acute complaints.    Outpatient Encounter Medications as of 12/12/2022  Medication Sig   acetaminophen (TYLENOL) 500 MG tablet Take 1,000 mg by mouth every 6 (six) hours as needed for mild pain or headache.   cholecalciferol (VITAMIN D3) 25 MCG (1000 UNIT) tablet Take 1,000 Units by mouth daily.   Multiple Vitamin (MULTIVITAMIN ADULT PO) Take by mouth.   omeprazole (PRILOSEC) 40 MG capsule Take by mouth.   ursodiol (ACTIGALL) 300 MG capsule Take 300 mg by mouth 2 (two) times daily.   [DISCONTINUED] labetalol (NORMODYNE) 200 MG tablet TAKE 1 TABLET(200 MG) BY MOUTH TWICE DAILY   [DISCONTINUED] NIFEdipine (ADALAT CC) 90 MG 24 hr tablet TAKE 1 TABLET BY MOUTH DAILY   labetalol (NORMODYNE) 200 MG tablet Take 1 tablet (200 mg total) by mouth 2 (two) times daily.   metoCLOPramide (REGLAN) 10 MG tablet TAKE 1 TABLET BY MOUTH AT 5AM THE MORNING OF SURGERY AND THEN EVERY 6 TO 8 HOURS AS NEEDED NAUSEA   NIFEdipine (ADALAT CC) 90 MG 24 hr tablet Take 1 tablet (90 mg total) by mouth daily.   No facility-administered encounter medications on file as of 12/12/2022.    Past Medical History:  Diagnosis Date   Bilateral polycystic ovarian syndrome    Chronic kidney disease    HSV-2 (herpes simplex virus 2) infection    Hypertension 2008   Prediabetes    Vaginal Pap smear, abnormal     Past Surgical History:  Procedure Laterality Date   BARIATRIC SURGERY  06/03/2022   CESAREAN SECTION N/A 06/27/2020   Procedure: CESAREAN SECTION;  Surgeon: Carlisle Cater, MD;  Location: [facility];  Service: Obstetrics;  Laterality: N/A;   KNEE SURGERY Right 2015   torn cartilage--arthroscopic   MOUTH SURGERY  2016   9 teeth pulled for decay-upper incisors.     Family History  Problem Relation Age of Onset   Heart failure Mother    Diabetes Mother    Hypertension Mother    Breast cancer Mother    Stroke Father        Nursing home bound   Hypertension Father    Dementia Father        from strokes   Other Sister        Prediabetes   Hypertension Sister     Social History   Socioeconomic History   Marital status: Single    Spouse name: [name]   Number of children: 0   Years of education: Not on file   Highest education level: Bachelor's degree (e.g., BA, AB, BS)  Occupational History   Not on file  Tobacco Use   Smoking status: Never    Passive exposure: Past   Smokeless tobacco: Never  Vaping Use   Vaping status: Never Used  Substance and Sexual Activity   Alcohol use: Never   Drug use: Never   Sexual activity: Yes    Birth control/protection: None  Other Topics Concern   Not on file  Social History Narrative   Lives with husband in [place].   He is a Education officer, environmental   She is a Forensic scientist.   Social Determinants of Health   Financial Resource Strain: Low Risk  (06/07/2022)  Received from [facility], [facility]   Overall Financial Resource Strain (CARDIA)    Difficulty of Paying Living Expenses: Not very hard  Food Insecurity: No Food Insecurity (06/07/2022)   Received from [facility], [facility]   Hunger Vital Sign    Worried About Running Out of Food in the Last Year: Never true    Ran Out of Food in the Last Year: Never true  Transportation Needs: No Transportation Needs (06/07/2022)   Received from [facility], [facility]   PRAPARE - Transportation    Lack of Transportation (Medical): No    Lack of Transportation (Non-Medical): No  Physical Activity: Insufficiently Active (06/07/2022)   Received from [facility], [facility]   Exercise Vital Sign    Days of Exercise per Week: 3 days    Minutes of Exercise per Session: 10 min  Stress: No Stress Concern Present  (06/07/2022)   Received from [facility] Health - Occupational Stress Questionnaire    Feeling of Stress : Not at all  Social Connections: Socially Integrated (06/07/2022)   Received from [facility], [facility]   Social Network    How would you rate your social network (family, work, friends)?: Good participation with social networks  Intimate Partner Violence: Not At Risk (06/07/2022)   Received from [facility], [facility]   HITS    Over the last 12 months how often did your partner physically hurt you?: 1    Over the last 12 months how often did your partner insult you or talk down to you?: 1    Over the last 12 months how often did your partner threaten you with physical harm?: 1    Over the last 12 months how often did your partner scream or curse at you?: 1    Review of Systems  All other systems reviewed and are negative.       Objective    BP (!) 150/99   Pulse 77   Temp 98.1 F (36.7 C) (Oral)   Resp 16   Wt 234 lb (106.1 kg)   SpO2 98%   BMI 36.65 kg/m   Physical Exam Vitals and nursing note reviewed.  Constitutional:      General: She is not in acute distress.    Appearance: She is obese.  Cardiovascular:     Rate and Rhythm: Normal rate and regular rhythm.  Pulmonary:     Effort: Pulmonary effort is normal.     Breath sounds: Normal breath sounds.  Abdominal:     Palpations: Abdomen is soft.     Tenderness: There is no abdominal tenderness.  Neurological:     General: No focal deficit present.     Mental Status: She is alert and oriented to person, place, and time.  Psychiatric:        Mood and Affect: Mood normal.        Behavior: Behavior normal.         Assessment & Plan:   1. Essential hypertension Slightly elevated readings. Discussed compliance. Meds refilled.   2. Class 2 severe obesity due to excess calories with serious comorbidity and body mass index (BMI) of 36.0 to  36.9 in adult [facility])     No follow-ups on file.   Tommie Raymond, MD

## 2023-01-01 DIAGNOSIS — D508 Other iron deficiency anemias: Secondary | ICD-10-CM | POA: Diagnosis not present

## 2023-01-01 DIAGNOSIS — D649 Anemia, unspecified: Secondary | ICD-10-CM | POA: Diagnosis not present

## 2023-01-01 DIAGNOSIS — N1831 Chronic kidney disease, stage 3a: Secondary | ICD-10-CM | POA: Diagnosis not present

## 2023-01-01 DIAGNOSIS — Z903 Acquired absence of stomach [part of]: Secondary | ICD-10-CM | POA: Diagnosis not present

## 2023-01-16 ENCOUNTER — Ambulatory Visit: Payer: Medicaid Other | Admitting: Family Medicine

## 2023-01-17 ENCOUNTER — Telehealth: Payer: Self-pay | Admitting: Orthopaedic Surgery

## 2023-01-17 NOTE — Telephone Encounter (Signed)
Sent mychart message to patient asking to call the office to reschedule her appointment scheduled for 01/21/2023 with Dr. Ophelia Charter

## 2023-01-17 NOTE — Telephone Encounter (Signed)
Called patient to R/S appointment    Got recording mailbox is full   could not leave message

## 2023-01-21 ENCOUNTER — Ambulatory Visit: Payer: Medicaid Other | Admitting: Orthopaedic Surgery

## 2023-03-17 ENCOUNTER — Ambulatory Visit: Payer: Medicaid Other | Admitting: Family Medicine

## 2023-03-19 ENCOUNTER — Telehealth: Payer: Self-pay | Admitting: Family Medicine

## 2023-03-19 ENCOUNTER — Ambulatory Visit: Payer: BC Managed Care – PPO | Admitting: Family Medicine

## 2023-03-19 NOTE — Telephone Encounter (Signed)
Called pt and could not leave vm due to vmbox being full. Calling to reschedule missed appt

## 2023-04-07 ENCOUNTER — Encounter: Payer: Self-pay | Admitting: Family Medicine

## 2023-04-07 ENCOUNTER — Ambulatory Visit: Payer: BC Managed Care – PPO | Admitting: Family Medicine

## 2023-04-07 VITALS — BP 131/87 | HR 73 | Temp 98.2°F | Resp 16 | Ht 67.0 in | Wt 210.2 lb

## 2023-04-07 DIAGNOSIS — E6609 Other obesity due to excess calories: Secondary | ICD-10-CM

## 2023-04-07 DIAGNOSIS — E66811 Obesity, class 1: Secondary | ICD-10-CM | POA: Diagnosis not present

## 2023-04-07 DIAGNOSIS — Z6832 Body mass index (BMI) 32.0-32.9, adult: Secondary | ICD-10-CM

## 2023-04-07 DIAGNOSIS — Z23 Encounter for immunization: Secondary | ICD-10-CM | POA: Diagnosis not present

## 2023-04-07 DIAGNOSIS — I10 Essential (primary) hypertension: Secondary | ICD-10-CM

## 2023-04-07 MED ORDER — LABETALOL HCL 200 MG PO TABS: 200.0000 mg | ORAL_TABLET | Freq: Two times a day (BID) | ORAL | 1 refills | Status: DC

## 2023-04-07 MED ORDER — NIFEDIPINE ER 90 MG PO TB24: 90.0000 mg | ORAL_TABLET | Freq: Every day | ORAL | 1 refills | Status: DC

## 2023-04-09 ENCOUNTER — Encounter: Payer: Self-pay | Admitting: Family Medicine

## 2023-04-09 NOTE — Progress Notes (Signed)
Established Patient Office Visit  Subjective    Patient ID: Donna Burns, female    DOB: 05-23-1981  Age: 42 y.o. MRN: [id number]  CC:  Chief Complaint  Patient presents with   Follow-up    Weight check, b/p medication    HPI Donna Burns presents for follow up of hypertension. Patient denies acute complaints. She has been taking meds as recommended.   Outpatient Encounter Medications as of 04/07/2023  Medication Sig   acetaminophen (TYLENOL) 500 MG tablet Take 1,000 mg by mouth every 6 (six) hours as needed for mild pain or headache.   cholecalciferol (VITAMIN D3) 25 MCG (1000 UNIT) tablet Take 1,000 Units by mouth daily.   metoCLOPramide (REGLAN) 10 MG tablet TAKE 1 TABLET BY MOUTH AT 5AM THE MORNING OF SURGERY AND THEN EVERY 6 TO 8 HOURS AS NEEDED NAUSEA   Multiple Vitamin (MULTIVITAMIN ADULT PO) Take by mouth.   omeprazole (PRILOSEC) 40 MG capsule Take by mouth.   ursodiol (ACTIGALL) 300 MG capsule Take 300 mg by mouth 2 (two) times daily.   [DISCONTINUED] labetalol (NORMODYNE) 200 MG tablet Take 1 tablet (200 mg total) by mouth 2 (two) times daily.   [DISCONTINUED] NIFEdipine (ADALAT CC) 90 MG 24 hr tablet Take 1 tablet (90 mg total) by mouth daily.   labetalol (NORMODYNE) 200 MG tablet Take 1 tablet (200 mg total) by mouth 2 (two) times daily.   NIFEdipine (ADALAT CC) 90 MG 24 hr tablet Take 1 tablet (90 mg total) by mouth daily.   No facility-administered encounter medications on file as of 04/07/2023.    Past Medical History:  Diagnosis Date   Bilateral polycystic ovarian syndrome    Chronic kidney disease    HSV-2 (herpes simplex virus 2) infection    Hypertension 2008   Prediabetes    Vaginal Pap smear, abnormal     Past Surgical History:  Procedure Laterality Date   BARIATRIC SURGERY  06/03/2022   CESAREAN SECTION N/A 06/27/2020   Procedure: CESAREAN SECTION;  Surgeon: Carlisle Cater, MD;  [facility];  Service: Obstetrics;  Laterality:  N/A;   KNEE SURGERY Right 2015   torn cartilage--arthroscopic   MOUTH SURGERY  2016   9 teeth pulled for decay-upper incisors.    Family History  Problem Relation Age of Onset   Heart failure Mother    Diabetes Mother    Hypertension Mother    Breast cancer Mother    Stroke Father        Nursing home bound   Hypertension Father    Dementia Father        from strokes   Other Sister        Prediabetes   Hypertension Sister     Social History   Socioeconomic History   Marital status: Single    Spouse name: [name]   Number of children: 0   Years of education: Not on file   Highest education level: Bachelor's degree (e.g., BA, AB, BS)  Occupational History   Not on file  Tobacco Use   Smoking status: Never    Passive exposure: Past   Smokeless tobacco: Never  Vaping Use   Vaping status: Never Used  Substance and Sexual Activity   Alcohol use: Never   Drug use: Never   Sexual activity: Yes    Birth control/protection: None  Other Topics Concern   Not on file  Social History Narrative   Lives with husband in [place].   He is  a [employer] driver   She is a Forensic scientist.   Social Determinants of Health   Financial Resource Strain: Low Risk  (06/07/2022)   Received from [facility], [facility]   Overall Financial Resource Strain (CARDIA)    Difficulty of Paying Living Expenses: Not very hard  Food Insecurity: No Food Insecurity (06/07/2022)   Received from [facility], [facility]   Hunger Vital Sign    Worried About Running Out of Food in the Last Year: Never true    Ran Out of Food in the Last Year: Never true  Transportation Needs: No Transportation Needs (06/07/2022)   Received from [facility], [facility]   PRAPARE - Transportation    Lack of Transportation (Medical): No    Lack of Transportation (Non-Medical): No  Physical Activity: Insufficiently Active (06/07/2022)   Received from [facility], [facility]   Exercise  Vital Sign    Days of Exercise per Week: 3 days    Minutes of Exercise per Session: 10 min  Stress: No Stress Concern Present (06/07/2022)   Received from [facility], [facility] of Occupational Health - Occupational Stress Questionnaire    Feeling of Stress : Not at all  Social Connections: Socially Integrated (06/07/2022)   Received from [facility], [facility]   Social Network    How would you rate your social network (family, work, friends)?: Good participation with social networks  Intimate Partner Violence: Not At Risk (06/07/2022)   Received from [facility], [facility]   HITS    Over the last 12 months how often did your partner physically hurt you?: Never    Over the last 12 months how often did your partner insult you or talk down to you?: Never    Over the last 12 months how often did your partner threaten you with physical harm?: Never    Over the last 12 months how often did your partner scream or curse at you?: Never    Review of Systems  All other systems reviewed and are negative.       Objective    BP 131/87 (BP Location: Right Arm, Patient Position: Sitting, Cuff Size: Normal)   Pulse 73   Temp 98.2 F (36.8 C) (Oral)   Resp 16   Ht 5\' 7"  (1.702 m)   Wt 210 lb 3.2 oz (95.3 kg)   SpO2 96%   BMI 32.92 kg/m   Physical Exam Vitals and nursing note reviewed.  Constitutional:      General: She is not in acute distress.    Appearance: She is obese.  Cardiovascular:     Rate and Rhythm: Normal rate and regular rhythm.  Pulmonary:     Effort: Pulmonary effort is normal.     Breath sounds: Normal breath sounds.  Abdominal:     Palpations: Abdomen is soft.     Tenderness: There is no abdominal tenderness.  Neurological:     General: No focal deficit present.     Mental Status: She is alert and oriented to person, place, and time.  Psychiatric:        Mood and Affect: Mood normal.        Behavior: Behavior normal.          Assessment & Plan:   Essential hypertension  Class 1 obesity due to excess calories with serious comorbidity and body mass index (BMI) of 32.0 to 32.9 in adult  Encounter for immunization -  Flu vaccine trivalent PF, 6mos and older(Flulaval,Afluria,Fluarix,Fluzone)  Other orders -     Labetalol HCl; Take 1 tablet (200 mg total) by mouth 2 (two) times daily.  Dispense: 180 tablet; Refill: 1 -     NIFEdipine ER; Take 1 tablet (90 mg total) by mouth daily.  Dispense: 90 tablet; Refill: 1     Return in about 3 months (around 07/08/2023) for follow up.   Tommie Raymond, MD

## 2023-04-10 DIAGNOSIS — Z903 Acquired absence of stomach [part of]: Secondary | ICD-10-CM | POA: Diagnosis not present

## 2023-04-10 DIAGNOSIS — D508 Other iron deficiency anemias: Secondary | ICD-10-CM | POA: Diagnosis not present

## 2023-04-10 DIAGNOSIS — N1831 Chronic kidney disease, stage 3a: Secondary | ICD-10-CM | POA: Diagnosis not present

## 2023-04-10 DIAGNOSIS — I10 Essential (primary) hypertension: Secondary | ICD-10-CM | POA: Diagnosis not present

## 2023-04-15 ENCOUNTER — Ambulatory Visit: Payer: Medicaid Other | Admitting: Orthopaedic Surgery

## 2023-04-22 ENCOUNTER — Ambulatory Visit: Payer: Medicaid Other | Admitting: Orthopaedic Surgery

## 2023-05-01 ENCOUNTER — Ambulatory Visit: Payer: BC Managed Care – PPO | Admitting: Family Medicine

## 2023-05-02 ENCOUNTER — Ambulatory Visit (INDEPENDENT_AMBULATORY_CARE_PROVIDER_SITE_OTHER): Payer: BC Managed Care – PPO | Admitting: Orthopaedic Surgery

## 2023-05-02 ENCOUNTER — Encounter: Payer: Self-pay | Admitting: Orthopaedic Surgery

## 2023-05-02 VITALS — BP 129/86 | HR 73 | Ht 67.0 in | Wt 212.0 lb

## 2023-05-02 DIAGNOSIS — G8929 Other chronic pain: Secondary | ICD-10-CM | POA: Diagnosis not present

## 2023-05-02 DIAGNOSIS — M48061 Spinal stenosis, lumbar region without neurogenic claudication: Secondary | ICD-10-CM

## 2023-05-02 DIAGNOSIS — M5442 Lumbago with sciatica, left side: Secondary | ICD-10-CM
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
## 2023-05-05 ENCOUNTER — Ambulatory Visit: Payer: BC Managed Care – PPO | Admitting: Family Medicine

## 2023-06-04 ENCOUNTER — Encounter: Payer: Self-pay | Admitting: Orthopaedic Surgery

## 2023-06-04 ENCOUNTER — Encounter: Payer: BC Managed Care – PPO | Admitting: Orthopedic Surgery

## 2023-06-07 ENCOUNTER — Ambulatory Visit
Admission: RE | Admit: 2023-06-07 | Discharge: 2023-06-07 | Disposition: A | Discharge status: Home / Self Care | Payer: BC Managed Care – PPO | Source: Ambulatory Visit | Attending: Orthopaedic Surgery | Admitting: Orthopaedic Surgery

## 2023-06-07 DIAGNOSIS — M47816 Spondylosis without myelopathy or radiculopathy, lumbar region: Secondary | ICD-10-CM | POA: Diagnosis not present

## 2023-06-07 DIAGNOSIS — M5126 Other intervertebral disc displacement, lumbar region: Secondary | ICD-10-CM | POA: Diagnosis not present

## 2023-06-07 DIAGNOSIS — G8929 Other chronic pain: Secondary | ICD-10-CM

## 2023-06-20 DIAGNOSIS — M25552 Pain in left hip: Secondary | ICD-10-CM | POA: Diagnosis not present

## 2023-06-20 DIAGNOSIS — M5442 Lumbago with sciatica, left side: Secondary | ICD-10-CM | POA: Diagnosis not present

## 2023-06-20 DIAGNOSIS — M5432 Sciatica, left side: Secondary | ICD-10-CM | POA: Diagnosis not present

## 2023-06-20 DIAGNOSIS — M5431 Sciatica, right side: Secondary | ICD-10-CM | POA: Diagnosis not present

## 2023-06-20 DIAGNOSIS — M5441 Lumbago with sciatica, right side: Secondary | ICD-10-CM | POA: Diagnosis not present

## 2023-06-23 ENCOUNTER — Telehealth: Payer: Self-pay | Admitting: Family Medicine

## 2023-06-23 NOTE — Telephone Encounter (Signed)
Marland Kitchen

## 2023-07-03 DIAGNOSIS — R600 Localized edema: Secondary | ICD-10-CM | POA: Diagnosis not present

## 2023-07-03 DIAGNOSIS — Z903 Acquired absence of stomach [part of]: Secondary | ICD-10-CM | POA: Diagnosis not present

## 2023-07-03 DIAGNOSIS — E663 Overweight: Secondary | ICD-10-CM | POA: Diagnosis not present

## 2023-07-03 DIAGNOSIS — Z9884 Bariatric surgery status: Secondary | ICD-10-CM | POA: Diagnosis not present

## 2023-07-03 DIAGNOSIS — N1831 Chronic kidney disease, stage 3a: Secondary | ICD-10-CM | POA: Diagnosis not present

## 2023-07-03 DIAGNOSIS — Z87898 Personal history of other specified conditions: Secondary | ICD-10-CM | POA: Diagnosis not present

## 2023-07-03 DIAGNOSIS — K912 Postsurgical malabsorption, not elsewhere classified: Secondary | ICD-10-CM | POA: Diagnosis not present

## 2023-07-03 DIAGNOSIS — D649 Anemia, unspecified: Secondary | ICD-10-CM | POA: Diagnosis not present

## 2023-07-07 ENCOUNTER — Encounter: Payer: BC Managed Care – PPO | Admitting: Orthopedic Surgery

## 2023-07-08 ENCOUNTER — Other Ambulatory Visit (INDEPENDENT_AMBULATORY_CARE_PROVIDER_SITE_OTHER): Payer: BC Managed Care – PPO

## 2023-07-08 ENCOUNTER — Ambulatory Visit (INDEPENDENT_AMBULATORY_CARE_PROVIDER_SITE_OTHER): Payer: BC Managed Care – PPO | Admitting: Orthopedic Surgery

## 2023-07-08 VITALS — BP 125/85 | HR 67 | Ht 67.0 in | Wt 208.0 lb

## 2023-07-08 DIAGNOSIS — G8929 Other chronic pain: Secondary | ICD-10-CM | POA: Diagnosis not present

## 2023-07-08 DIAGNOSIS — M5442 Lumbago with sciatica, left side: Secondary | ICD-10-CM

## 2023-07-08 NOTE — Progress Notes (Signed)
 Orthopedic Spine Surgery Office Note  Assessment: Patient is a 43 y.o. female with low back pain that is into the bilateral buttocks and along the posterior aspect of the left thigh and leg.  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  She also has PI-LL mismatch   Plan: -Explained that initially conservative treatment is tried as a significant number of patients may experience relief with these treatment modalities. Discussed that the conservative treatments include:  -activity modification  -physical therapy  -over the counter pain medications  -medrol dosepak  -lumbar steroid injections -Patient has tried Tylenol, intramuscular steroid injection, Medrol Dosepak -Recommended PT, referral provided to her today.  Also recommended a diagnostic/therapeutic L4/5 injection -Patient should return to office in 8 weeks, x-rays at next visit: none   Patient expressed understanding of the plan and all questions were answered to the patient's satisfaction.   ___________________________________________________________________________   History:  Patient is a 43 y.o. female who presents today for lumbar spine.  Patient has had about 1 year of low back pain that radiates into the bilateral lower extremities.  On the right side, she feels it radiating into the right buttock.  On the left side, she feels it radiating down the posterior aspect of the thigh and leg to the level of the ankle.  She notes the pain on a daily basis.  At its worst, she rates the pain as an 8 out of 10.  There is no trauma or injury that preceded the onset of pain.  Pain has been getting progressively worse with time.  She notes pain with activity and at rest.  She notes having to frequently change position to help with the pain.   Weakness: Denies Symptoms of imbalance: Denies Paresthesias and numbness: Denies Bowel or bladder incontinence: Has a several year history of stress  incontinence.  No recent changes.  No bowel incontinence. Saddle anesthesia: Denies  Treatments tried: Tylenol, intramuscular steroid injection, Medrol Dosepak  Review of systems: Denies fevers and chills, night sweats, unexplained weight loss, history of cancer.  Has had pain that wakes her at night  Past medical history: HTN CKD PCOS HSV-2  Allergies: NKDA  Past surgical history:  Gastric sleeve Cesarean section Right knee arthroscopic surgery  Social history: Denies use of nicotine product (smoking, vaping, patches, smokeless) Alcohol use: Denies Denies recreational drug use   Physical Exam:  BMI of 32.6  General: no acute distress, appears stated age Neurologic: alert, answering questions appropriately, following commands Respiratory: unlabored breathing on room air, symmetric chest rise Psychiatric: appropriate affect, normal cadence to speech   MSK (spine):  -Strength exam      Left  Right EHL    5/5  5/5 TA    5/5  5/5 GSC    5/5  5/5 Knee extension  5/5  5/5 Hip flexion   5/5  5/5  -Sensory exam    Sensation intact to light touch in L3-S1 nerve distributions of bilateral lower extremities  -Achilles DTR: 2/4 on the left, 2/4 on the right -Patellar tendon DTR: 2/4 on the left, 2/4 on the right  -Straight leg raise: Negative bilaterally -Femoral nerve stretch test: Negative bilaterally -Clonus: no beats bilaterally  -Left hip exam: No pain through range of motion, negative Stinchfield, negative FABER -Right hip exam: No pain through range of motion, negative Stinchfield, negative FABER  Imaging: XRs of the lumbar spine from 07/08/2023 were independently reviewed and interpreted, showing spondylolisthesis that measures 8.17mm in the  neutral standing lateral. Disc height loss at L4/5. No fracture or dislocation seen. PI of 64, LL of 37.   MRI of the lumbar spine from 06/07/2023 was independently reviewed and interpreted, showing central and lateral  stenosis at L4/5. Spondylolisthesis that measures 3.75mm in the supine position. DDD at L4/5.    Patient name: Donna Burns Patient MRN: [id number] Date of visit: 07/08/23

## 2023-07-10 DIAGNOSIS — Z1330 Encounter for screening examination for mental health and behavioral disorders, unspecified: Secondary | ICD-10-CM | POA: Diagnosis not present

## 2023-07-10 DIAGNOSIS — Z903 Acquired absence of stomach [part of]: Secondary | ICD-10-CM | POA: Diagnosis not present

## 2023-07-10 DIAGNOSIS — Z6832 Body mass index (BMI) 32.0-32.9, adult: Secondary | ICD-10-CM | POA: Diagnosis not present

## 2023-07-10 DIAGNOSIS — E66811 Obesity, class 1: Secondary | ICD-10-CM | POA: Diagnosis not present

## 2023-07-10 DIAGNOSIS — I10 Essential (primary) hypertension: Secondary | ICD-10-CM | POA: Diagnosis not present

## 2023-07-10 DIAGNOSIS — N183 Chronic kidney disease, stage 3 unspecified: Secondary | ICD-10-CM | POA: Diagnosis not present

## 2023-07-10 DIAGNOSIS — E559 Vitamin D deficiency, unspecified: Secondary | ICD-10-CM | POA: Diagnosis not present

## 2023-07-10 DIAGNOSIS — R634 Abnormal weight loss: Secondary | ICD-10-CM | POA: Diagnosis not present

## 2023-07-10 DIAGNOSIS — Z87898 Personal history of other specified conditions: Secondary | ICD-10-CM | POA: Diagnosis not present

## 2023-07-14 ENCOUNTER — Encounter: Payer: Self-pay | Admitting: Family Medicine

## 2023-07-14 ENCOUNTER — Ambulatory Visit: Payer: BC Managed Care – PPO | Admitting: Family Medicine

## 2023-07-17 DIAGNOSIS — R300 Dysuria: Secondary | ICD-10-CM | POA: Diagnosis not present

## 2023-07-17 DIAGNOSIS — K8020 Calculus of gallbladder without cholecystitis without obstruction: Secondary | ICD-10-CM | POA: Diagnosis not present

## 2023-07-17 DIAGNOSIS — X58XXXA Exposure to other specified factors, initial encounter: Secondary | ICD-10-CM | POA: Diagnosis not present

## 2023-07-17 DIAGNOSIS — I129 Hypertensive chronic kidney disease with stage 1 through stage 4 chronic kidney disease, or unspecified chronic kidney disease: Secondary | ICD-10-CM | POA: Diagnosis not present

## 2023-07-17 DIAGNOSIS — R1084 Generalized abdominal pain: Secondary | ICD-10-CM | POA: Diagnosis not present

## 2023-07-17 DIAGNOSIS — N183 Chronic kidney disease, stage 3 unspecified: Secondary | ICD-10-CM | POA: Diagnosis not present

## 2023-07-17 DIAGNOSIS — S39011A Strain of muscle, fascia and tendon of abdomen, initial encounter: Secondary | ICD-10-CM | POA: Diagnosis not present

## 2023-07-18 ENCOUNTER — Telehealth: Payer: Self-pay | Admitting: Family Medicine

## 2023-07-18 DIAGNOSIS — K8020 Calculus of gallbladder without cholecystitis without obstruction: Secondary | ICD-10-CM | POA: Diagnosis not present

## 2023-07-18 NOTE — Telephone Encounter (Signed)
 Donna Burns

## 2023-07-21 ENCOUNTER — Other Ambulatory Visit: Payer: Self-pay

## 2023-07-21 ENCOUNTER — Ambulatory Visit (INDEPENDENT_AMBULATORY_CARE_PROVIDER_SITE_OTHER): Payer: BC Managed Care – PPO | Admitting: Physical Medicine and Rehabilitation

## 2023-07-21 VITALS — BP 147/97 | HR 78

## 2023-07-21 DIAGNOSIS — M5416 Radiculopathy, lumbar region: Secondary | ICD-10-CM

## 2023-07-21 MED ORDER — METHYLPREDNISOLONE ACETATE 40 MG/ML IJ SUSP
40.0000 mg | Freq: Once | INTRAMUSCULAR | Status: AC
  Administered 2023-07-21: 40 mg

## 2023-07-21 NOTE — Progress Notes (Unsigned)
 Pain Score--6 No Allergies to Contrast Dye No Blood thinners

## 2023-07-21 NOTE — Patient Instructions (Signed)

## 2023-07-22 ENCOUNTER — Ambulatory Visit: Payer: BC Managed Care – PPO | Attending: Orthopedic Surgery | Admitting: Physical Therapy

## 2023-07-22 ENCOUNTER — Other Ambulatory Visit: Payer: Self-pay

## 2023-07-22 ENCOUNTER — Encounter: Payer: Self-pay | Admitting: Physical Therapy

## 2023-07-22 DIAGNOSIS — M5442 Lumbago with sciatica, left side: Secondary | ICD-10-CM | POA: Insufficient documentation

## 2023-07-22 DIAGNOSIS — G8929 Other chronic pain: Secondary | ICD-10-CM | POA: Insufficient documentation

## 2023-07-22 DIAGNOSIS — M6281 Muscle weakness (generalized): Secondary | ICD-10-CM | POA: Diagnosis not present

## 2023-07-22 DIAGNOSIS — M5459 Other low back pain: Secondary | ICD-10-CM | POA: Insufficient documentation
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
## 2023-07-23 NOTE — Procedures (Signed)
 Lumbosacral Transforaminal Epidural Steroid Injection - Sub-Pedicular Approach with Fluoroscopic Guidance  Patient: Donna Burns      Date of Birth: 10-15-1980 MRN: [id number] PCP: Georganna Skeans, MD      Visit Date: 07/21/2023   Universal Protocol:    Date/Time: 07/21/2023  Consent Given By: the patient  Position: PRONE  Additional Comments: Vital signs were monitored before and after the procedure. Patient was prepped and draped in the usual sterile fashion. The correct patient, procedure, and site was verified.   Injection Procedure Details:   Procedure diagnoses: Lumbar radiculopathy [M54.16]    Meds Administered:  Meds ordered this encounter  Medications   methylPREDNISolone acetate (DEPO-MEDROL) injection 40 mg    Laterality: Left  Location/Site: L4  Needle:6.0 in., 22 ga.  Short bevel or Quincke spinal needle  Needle Placement: Transforaminal  Findings:    -Comments: Excellent flow of contrast along the nerve, nerve root and into the epidural space.  Procedure Details: After squaring off the end-plates to get a true AP view, the C-arm was positioned so that an oblique view of the foramen as noted above was visualized. The target area is just inferior to the "nose of the scotty dog" or sub pedicular. The soft tissues overlying this structure were infiltrated with 2-3 ml. of 1% Lidocaine without Epinephrine.  The spinal needle was inserted toward the target using a "trajectory" view along the fluoroscope beam.  Under AP and lateral visualization, the needle was advanced so it did not puncture dura and was located close the 6 O'Clock position of the pedical in AP tracterory. Biplanar projections were used to confirm position. Aspiration was confirmed to be negative for CSF and/or blood. A 1-2 ml. volume of Isovue-250 was injected and flow of contrast was noted at each level. Radiographs were obtained for documentation purposes.   After attaining the desired flow  of contrast documented above, a 0.5 to 1.0 ml test dose of 0.25% Marcaine was injected into each respective transforaminal space.  The patient was observed for 90 seconds post injection.  After no sensory deficits were reported, and normal lower extremity motor function was noted,   the above injectate was administered so that equal amounts of the injectate were placed at each foramen (level) into the transforaminal epidural space.   Additional Comments:  The patient tolerated the procedure well Dressing: 2 x 2 sterile gauze and Band-Aid    Post-procedure details: Patient was observed during the procedure. Post-procedure instructions were reviewed.  Patient left the clinic in stable condition.

## 2023-07-23 NOTE — Progress Notes (Signed)
 Donna Burns - 43 y.o. female MRN [id number]  Date of birth: 15-Apr-1981  Office Visit Note: Visit Date: 07/21/2023 PCP: Georganna Skeans, MD Referred by: London Sheer, MD  Subjective: Chief Complaint  Patient presents with   Lower Back - Pain   HPI:  Donna Burns is a 43 y.o. female who comes in today at the request of Dr. Willia Craze for planned Left L4-5 Lumbar Transforaminal epidural steroid injection with fluoroscopic guidance.  The patient has failed conservative care including home exercise, medications, time and activity modification.  This injection will be diagnostic and hopefully therapeutic.  Please see requesting physician notes for further details and justification.   ROS Otherwise per HPI.  Assessment & Plan: Visit Diagnoses:    ICD-10-CM   1. Lumbar radiculopathy  M54.16 methylPREDNISolone acetate (DEPO-MEDROL) injection 40 mg    XR C-ARM NO REPORT    Epidural Steroid injection      Plan: No additional findings.   Meds & Orders:  Meds ordered this encounter  Medications   methylPREDNISolone acetate (DEPO-MEDROL) injection 40 mg    Orders Placed This Encounter  Procedures   XR C-ARM NO REPORT   Epidural Steroid injection    Follow-up: Return for visit to requesting provider as needed.   Procedures: No procedures performed  Lumbosacral Transforaminal Epidural Steroid Injection - Sub-Pedicular Approach with Fluoroscopic Guidance  Patient: Donna Burns      Date of Birth: 24-Sep-1980 MRN: [id number] PCP: Georganna Skeans, MD      Visit Date: 07/21/2023   Universal Protocol:    Date/Time: 07/21/2023  Consent Given By: the patient  Position: PRONE  Additional Comments: Vital signs were monitored before and after the procedure. Patient was prepped and draped in the usual sterile fashion. The correct patient, procedure, and site was verified.   Injection Procedure Details:   Procedure diagnoses: Lumbar radiculopathy [M54.16]    Meds  Administered:  Meds ordered this encounter  Medications   methylPREDNISolone acetate (DEPO-MEDROL) injection 40 mg    Laterality: Left  Location/Site: L4  Needle:6.0 in., 22 ga.  Short bevel or Quincke spinal needle  Needle Placement: Transforaminal  Findings:    -Comments: Excellent flow of contrast along the nerve, nerve root and into the epidural space.  Procedure Details: After squaring off the end-plates to get a true AP view, the C-arm was positioned so that an oblique view of the foramen as noted above was visualized. The target area is just inferior to the "nose of the scotty dog" or sub pedicular. The soft tissues overlying this structure were infiltrated with 2-3 ml. of 1% Lidocaine without Epinephrine.  The spinal needle was inserted toward the target using a "trajectory" view along the fluoroscope beam.  Under AP and lateral visualization, the needle was advanced so it did not puncture dura and was located close the 6 O'Clock position of the pedical in AP tracterory. Biplanar projections were used to confirm position. Aspiration was confirmed to be negative for CSF and/or blood. A 1-2 ml. volume of Isovue-250 was injected and flow of contrast was noted at each level. Radiographs were obtained for documentation purposes.   After attaining the desired flow of contrast documented above, a 0.5 to 1.0 ml test dose of 0.25% Marcaine was injected into each respective transforaminal space.  The patient was observed for 90 seconds post injection.  After no sensory deficits were reported, and normal lower extremity motor function was noted,   the above injectate was administered so that equal  amounts of the injectate were placed at each foramen (level) into the transforaminal epidural space.   Additional Comments:  The patient tolerated the procedure well Dressing: 2 x 2 sterile gauze and Band-Aid    Post-procedure details: Patient was observed during the procedure. Post-procedure  instructions were reviewed.  Patient left the clinic in stable condition.     Clinical History: MRI LUMBAR SPINE WITHOUT CONTRAST   TECHNIQUE: Multiplanar, multisequence MR imaging of the lumbar spine was performed. No intravenous contrast was administered.   COMPARISON:  Lumbar spine x-rays dated Oct 22, 2022. MRI lumbar spine dated July 19, 2021.   FINDINGS: Segmentation:  Standard.   Alignment:  Unchanged mild anterolisthesis at L4-L5.   Vertebrae:  No fracture, evidence of discitis, or bone lesion.   Conus medullaris and cauda equina: Conus extends to the L1 level. Conus and cauda equina appear normal.   Paraspinal and other soft tissues: Negative.   Disc levels:   T12-L1 to L2-L3:  Negative.   L3-L4: Unchanged mild disc bulging with superimposed left foraminal disc protrusion contacting the exiting left L3 nerve root. Unchanged mild bilateral facet arthropathy. Similar mild spinal canal stenosis and mild-to-moderate left neuroforaminal stenosis. No right neuroforaminal stenosis.   L4-L5: Progressive moderate disc bulging and bilateral facet arthropathy. Progressive moderate to severe spinal canal stenosis. Progressive moderate to severe right neuroforaminal stenosis. No left neuroforaminal stenosis.   L5-S1: Progressive small shallow central disc protrusion and mild-to-moderate bilateral facet arthropathy. New mild bilateral lateral recess stenosis. No spinal canal or neuroforaminal stenosis.   IMPRESSION: 1. Progressive multilevel lumbar spondylosis as described above. Progressive moderate to severe spinal canal stenosis and moderate to severe right neuroforaminal stenosis at L4-L5. 2. Unchanged left foraminal disc protrusion at L3-L4 contacting the exiting left L3 nerve root.     Electronically Signed   By: Obie Dredge M.D.   On: 06/17/2023 17:11     Objective:  VS:  HT:    WT:   BMI:     BP:(!) 147/97  HR:78bpm  TEMP: ( )  RESP:   Physical Exam Vitals and nursing note reviewed.  Constitutional:      General: She is not in acute distress.    Appearance: Normal appearance. She is not ill-appearing.  HENT:     Head: Normocephalic and atraumatic.     Right Ear: External ear normal.     Left Ear: External ear normal.  Eyes:     Extraocular Movements: Extraocular movements intact.  Cardiovascular:     Rate and Rhythm: Normal rate.     Pulses: Normal pulses.  Pulmonary:     Effort: Pulmonary effort is normal. No respiratory distress.  Abdominal:     General: There is no distension.     Palpations: Abdomen is soft.  Musculoskeletal:        General: Tenderness present.     Cervical back: Neck supple.     Right lower leg: No edema.     Left lower leg: No edema.     Comments: Patient has good distal strength with no pain over the greater trochanters.  No clonus or focal weakness.  Skin:    Findings: No erythema, lesion or rash.  Neurological:     General: No focal deficit present.     Mental Status: She is alert and oriented to person, place, and time.     Sensory: No sensory deficit.     Motor: No weakness or abnormal muscle tone.     Coordination:  Coordination normal.  Psychiatric:        Mood and Affect: Mood normal.        Behavior: Behavior normal.      Imaging: No results found.
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
## 2023-07-28 ENCOUNTER — Ambulatory Visit: Payer: BC Managed Care – PPO

## 2023-07-28 ENCOUNTER — Ambulatory Visit: Attending: Orthopedic Surgery

## 2023-07-28 DIAGNOSIS — M6281 Muscle weakness (generalized): Secondary | ICD-10-CM | POA: Diagnosis not present

## 2023-07-28 DIAGNOSIS — M5459 Other low back pain: Secondary | ICD-10-CM | POA: Diagnosis not present
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
## 2023-07-30 ENCOUNTER — Ambulatory Visit: Payer: BC Managed Care – PPO | Admitting: Physical Therapy

## 2023-07-30 DIAGNOSIS — M5459 Other low back pain: Secondary | ICD-10-CM | POA: Diagnosis not present

## 2023-07-30 DIAGNOSIS — M6281 Muscle weakness (generalized): Secondary | ICD-10-CM
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
## 2023-08-06 ENCOUNTER — Ambulatory Visit: Payer: BC Managed Care – PPO | Admitting: Physical Therapy

## 2023-08-06 DIAGNOSIS — M6281 Muscle weakness (generalized): Secondary | ICD-10-CM

## 2023-08-06 DIAGNOSIS — N1831 Chronic kidney disease, stage 3a: Secondary | ICD-10-CM | POA: Diagnosis not present

## 2023-08-06 DIAGNOSIS — M5459 Other low back pain: Secondary | ICD-10-CM

## 2023-08-06 DIAGNOSIS — E559 Vitamin D deficiency, unspecified: Secondary | ICD-10-CM | POA: Diagnosis not present

## 2023-08-06 DIAGNOSIS — D508 Other iron deficiency anemias: Secondary | ICD-10-CM | POA: Diagnosis not present

## 2023-08-06 DIAGNOSIS — Z903 Acquired absence of stomach [part of]: Secondary | ICD-10-CM | POA: Diagnosis not present
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
## 2023-08-09 ENCOUNTER — Ambulatory Visit: Payer: BC Managed Care – PPO

## 2023-08-09 DIAGNOSIS — M5459 Other low back pain: Secondary | ICD-10-CM | POA: Diagnosis not present

## 2023-08-09 DIAGNOSIS — M6281 Muscle weakness (generalized): Secondary | ICD-10-CM | POA: Diagnosis not present
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
## 2023-09-04 ENCOUNTER — Ambulatory Visit: Payer: BC Managed Care – PPO | Admitting: Orthopedic Surgery

## 2023-09-08 DIAGNOSIS — E282 Polycystic ovarian syndrome: Secondary | ICD-10-CM | POA: Diagnosis not present

## 2023-09-08 DIAGNOSIS — Z01419 Encounter for gynecological examination (general) (routine) without abnormal findings: Secondary | ICD-10-CM | POA: Diagnosis not present

## 2023-09-08 DIAGNOSIS — Z1231 Encounter for screening mammogram for malignant neoplasm of breast: Secondary | ICD-10-CM | POA: Diagnosis not present

## 2023-09-08 DIAGNOSIS — N9489 Other specified conditions associated with female genital organs and menstrual cycle: Secondary | ICD-10-CM | POA: Diagnosis not present

## 2023-09-08 DIAGNOSIS — Z130 Encounter for screening for diseases of the blood and blood-forming organs and certain disorders involving the immune mechanism: Secondary | ICD-10-CM | POA: Diagnosis not present

## 2023-09-08 DIAGNOSIS — Z1389 Encounter for screening for other disorder: Secondary | ICD-10-CM | POA: Diagnosis not present

## 2023-09-08 DIAGNOSIS — N08 Glomerular disorders in diseases classified elsewhere: Secondary | ICD-10-CM | POA: Diagnosis not present

## 2023-09-08 DIAGNOSIS — Z01411 Encounter for gynecological examination (general) (routine) with abnormal findings: Secondary | ICD-10-CM | POA: Diagnosis not present

## 2023-09-11 ENCOUNTER — Ambulatory Visit: Admitting: Orthopedic Surgery

## 2023-09-29 DIAGNOSIS — N183 Chronic kidney disease, stage 3 unspecified: Secondary | ICD-10-CM | POA: Diagnosis not present

## 2023-10-01 DIAGNOSIS — Z903 Acquired absence of stomach [part of]: Secondary | ICD-10-CM | POA: Diagnosis not present

## 2023-10-01 DIAGNOSIS — I129 Hypertensive chronic kidney disease with stage 1 through stage 4 chronic kidney disease, or unspecified chronic kidney disease: Secondary | ICD-10-CM | POA: Diagnosis not present

## 2023-10-01 DIAGNOSIS — N183 Chronic kidney disease, stage 3 unspecified: Secondary | ICD-10-CM | POA: Diagnosis not present

## 2023-10-08 ENCOUNTER — Ambulatory Visit: Admitting: Orthopedic Surgery

## 2023-10-08 DIAGNOSIS — M5416 Radiculopathy, lumbar region: Secondary | ICD-10-CM

## 2023-10-08 NOTE — Progress Notes (Signed)
 Orthopedic Spine Surgery Office Note   Assessment: Patient is a 43 y.o. female with low back pain that is into the bilateral buttocks and along the posterior aspect of the left thigh and leg.  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  She also has PI-LL mismatch     Plan: -Patient has tried PT, tylenol , intramuscular steroid injection, Medrol  Dosepak, lumbar steroid injection -Patient has had pain now for over a year in spite of conservative treatments, so discussed surgery as a treatment option.  She is feeling on her job right now and wanted to wait until the fall.  In the meantime, she can continue with Tylenol  as needed up to 1000 mg 3 times daily -Patient should return to office in 3 months, x-rays at next visit: AP/lateral/Flex/ex lumbar     Patient expressed understanding of the plan and all questions were answered to the patient's satisfaction.    ___________________________________________________________________________     History:   Patient is a 43 y.o. female who presents today for follow-up on her lumbar spine.  Patient has had over a year of low back pain that radiates into her bilateral lower extremities.  She feels the going into the right buttock.  On the left side, she feels a going into the posterior aspect of the thigh and leg to the level of the ankle.  She notices the pain on a daily basis.  She notices it mostly though when walking or standing.  It gets better if she sits down.  After our last visit, patient got an injection with Dr. Daisey Dryer.  She said she noted about 70% relief with that injection.  She said she was feeling pretty good but it only lasted for about 2 weeks and then pain gradually returned.  She said her pain is now the same as it was when she last saw me in the office.  She has not developed any new symptoms since she was last seen in the office.     Treatments tried: PT, tylenol , intramuscular steroid injection,  Medrol  Dosepak, lumbar steroid injection     Physical Exam:   General: no acute distress, appears stated age Neurologic: alert, answering questions appropriately, following commands Respiratory: unlabored breathing on room air, symmetric chest rise Psychiatric: appropriate affect, normal cadence to speech     MSK (spine):   -Strength exam                                                   Left                  Right EHL                              5/5                  5/5 TA                                 5/5                  5/5 GSC  5/5                  5/5 Knee extension            5/5                  5/5 Hip flexion                    5/5                  5/5   -Sensory exam                           Sensation intact to light touch in L3-S1 nerve distributions of bilateral lower extremities   Imaging: XRs of the lumbar spine from 07/08/2023 were previously independently reviewed and interpreted, showing spondylolisthesis that measures 8.110mm in the neutral standing lateral. Disc height loss at L4/5. No fracture or dislocation seen. PI of 64, LL of 37.    MRI of the lumbar spine from 06/07/2023 was previously independently reviewed and interpreted, showing central and lateral stenosis at L4/5. Spondylolisthesis that measures 3.71mm in the supine position. DDD at L4/5.      Patient name: Donna Burns Patient MRN: [id number] Date of visit: 10/08/23

## 2023-10-09 ENCOUNTER — Ambulatory Visit: Admitting: Family Medicine

## 2023-10-13 ENCOUNTER — Encounter: Payer: Self-pay | Admitting: Family Medicine

## 2023-10-13 ENCOUNTER — Ambulatory Visit (INDEPENDENT_AMBULATORY_CARE_PROVIDER_SITE_OTHER): Admitting: Family Medicine

## 2023-10-13 VITALS — BP 129/86 | HR 66 | Temp 98.5°F | Resp 14 | Ht 67.0 in | Wt 217.0 lb

## 2023-10-13 DIAGNOSIS — I10 Essential (primary) hypertension: Secondary | ICD-10-CM | POA: Diagnosis not present

## 2023-10-13 DIAGNOSIS — E6609 Other obesity due to excess calories: Secondary | ICD-10-CM

## 2023-10-13 DIAGNOSIS — M79672 Pain in left foot: Secondary | ICD-10-CM

## 2023-10-13 DIAGNOSIS — Z6833 Body mass index (BMI) 33.0-33.9, adult: Secondary | ICD-10-CM

## 2023-10-13 DIAGNOSIS — E66811 Obesity, class 1: Secondary | ICD-10-CM

## 2023-10-13 MED ORDER — LABETALOL HCL 200 MG PO TABS: 200.0000 mg | ORAL_TABLET | Freq: Two times a day (BID) | ORAL | 1 refills | Status: AC

## 2023-10-13 MED ORDER — NIFEDIPINE ER 90 MG PO TB24: 90.0000 mg | ORAL_TABLET | Freq: Every day | ORAL | 1 refills | Status: AC

## 2023-10-13 NOTE — Progress Notes (Signed)
 Established Patient Office Visit  Subjective    Patient ID: Donna Burns, female    DOB: 1980/06/04  Age: 43 y.o. MRN: [id number]  CC:  Chief Complaint  Patient presents with   Follow-up    HPI Donna Burns presents for follow up of hypertension. Patient reports being med compliance. Patient also reports some left foot pain. She denies known trauma or injury.   Outpatient Encounter Medications as of 10/13/2023  Medication Sig   acetaminophen  (TYLENOL ) 500 MG tablet Take 1,000 mg by mouth every 6 (six) hours as needed for mild pain or headache.   cholecalciferol (VITAMIN D3) 25 MCG (1000 UNIT) tablet Take 1,000 Units by mouth daily.   metoCLOPramide (REGLAN) 10 MG tablet TAKE 1 TABLET BY MOUTH AT 5AM THE MORNING OF SURGERY AND THEN EVERY 6 TO 8 HOURS AS NEEDED NAUSEA   Multiple Vitamin (MULTIVITAMIN ADULT PO) Take by mouth.   omeprazole (PRILOSEC) 40 MG capsule Take by mouth.   ursodiol (ACTIGALL) 300 MG capsule Take 300 mg by mouth 2 (two) times daily.   [DISCONTINUED] labetalol  (NORMODYNE ) 200 MG tablet Take 1 tablet (200 mg total) by mouth 2 (two) times daily.   [DISCONTINUED] NIFEdipine  (ADALAT  CC) 90 MG 24 hr tablet Take 1 tablet (90 mg total) by mouth daily.   labetalol  (NORMODYNE ) 200 MG tablet Take 1 tablet (200 mg total) by mouth 2 (two) times daily.   NIFEdipine  (ADALAT  CC) 90 MG 24 hr tablet Take 1 tablet (90 mg total) by mouth daily.   No facility-administered encounter medications on file as of 10/13/2023.    Past Medical History:  Diagnosis Date   Bilateral polycystic ovarian syndrome    Chronic kidney disease    HSV-2 (herpes simplex virus 2) infection    Hypertension 2008   Prediabetes    Vaginal Pap smear, abnormal     Past Surgical History:  Procedure Laterality Date   BARIATRIC SURGERY  06/03/2022   CESAREAN SECTION N/A 06/27/2020   Procedure: CESAREAN SECTION;  Surgeon: Gaspar Karma, MD;  Location: [facility] LD ORS;  Service: Obstetrics;   Laterality: N/A;   KNEE SURGERY Right 2015   torn cartilage--arthroscopic   MOUTH SURGERY  2016   9 teeth pulled for decay-upper incisors.    Family History  Problem Relation Age of Onset   Heart failure Mother    Diabetes Mother    Hypertension Mother    Breast cancer Mother    Stroke Father        Nursing home bound   Hypertension Father    Dementia Father        from strokes   Other Sister        Prediabetes   Hypertension Sister     Social History   Socioeconomic History   Marital status: Single    Spouse name: [name]   Number of children: 0   Years of education: Not on file   Highest education level: Bachelor's degree (e.g., BA, AB, BS)  Occupational History   Not on file  Tobacco Use   Smoking status: Never    Passive exposure: Past   Smokeless tobacco: Never  Vaping Use   Vaping status: Never Used  Substance and Sexual Activity   Alcohol use: Never   Drug use: Never   Sexual activity: Yes    Birth control/protection: None  Other Topics Concern   Not on file  Social History Narrative   Lives with husband in [place].   He is  a [employer] driver   She is a Forensic scientist.   Social Drivers of Health   Financial Resource Strain: Patient Declined (07/10/2023)   Received from [facility]   Overall Financial Resource Strain (CARDIA)    Difficulty of Paying Living Expenses: Patient declined  Food Insecurity: Patient Declined (07/10/2023)   Received from [facility]   Hunger Vital Sign    Worried About Running Out of Food in the Last Year: Patient declined    Ran Out of Food in the Last Year: Patient declined  Transportation Needs: Patient Declined (07/10/2023)   Received from [facility] - Transportation    Lack of Transportation (Medical): Patient declined    Lack of Transportation (Non-Medical): Patient declined  Physical Activity: Insufficiently Active (06/07/2022)   Received from [facility], [facility]    Exercise Vital Sign    Days of Exercise per Week: 3 days    Minutes of Exercise per Session: 10 min  Stress: No Stress Concern Present (06/07/2022)   Received from [facility], [facility]   [employer] of Occupational Health - Occupational Stress Questionnaire    Feeling of Stress : Not at all  Social Connections: Socially Integrated (06/07/2022)   Received from [facility], [facility]   Social Network    How would you rate your social network (family, work, friends)?: Good participation with social networks  Intimate Partner Violence: Not At Risk (07/17/2023)   Received from [facility]   HITS    Over the last 12 months how often did your partner physically hurt you?: Never    Over the last 12 months how often did your partner insult you or talk down to you?: Never    Over the last 12 months how often did your partner threaten you with physical harm?: Never    Over the last 12 months how often did your partner scream or curse at you?: Never    Review of Systems  All other systems reviewed and are negative.       Objective    BP 129/86 (BP Location: Right Arm, Patient Position: Sitting, Cuff Size: Normal)   Pulse 66   Temp 98.5 F (36.9 C) (Oral)   Resp 14   Ht 5\' 7"  (1.702 m)   Wt 217 lb (98.4 kg)   SpO2 99%   BMI 33.99 kg/m   Physical Exam Vitals and nursing note reviewed.  Constitutional:      General: She is not in acute distress.    Appearance: She is obese.  Cardiovascular:     Rate and Rhythm: Normal rate and regular rhythm.  Pulmonary:     Effort: Pulmonary effort is normal.     Breath sounds: Normal breath sounds.  Abdominal:     Palpations: Abdomen is soft.     Tenderness: There is no abdominal tenderness.  Musculoskeletal:     Left foot: Tenderness present. No swelling or deformity.  Neurological:     General: No focal deficit present.     Mental Status: She is alert and oriented to person, place, and time.  Psychiatric:        Mood  and Affect: Mood normal.        Behavior: Behavior normal.         Assessment & Plan:   Left foot pain -     Ambulatory referral to Podiatry  Essential hypertension  Class 1 obesity due to excess calories with serious comorbidity and body mass index (  BMI) of 33.0 to 33.9 in adult  Other orders -     NIFEdipine  ER; Take 1 tablet (90 mg total) by mouth daily.  Dispense: 90 tablet; Refill: 1 -     Labetalol  HCl; Take 1 tablet (200 mg total) by mouth 2 (two) times daily.  Dispense: 180 tablet; Refill: 1     Return in about 3 months (around 01/13/2024) for follow up, chronic med issues.   Arlo Lama, MD

## 2023-10-14 ENCOUNTER — Encounter: Payer: Self-pay | Admitting: Family Medicine

## 2023-10-28 ENCOUNTER — Ambulatory Visit (INDEPENDENT_AMBULATORY_CARE_PROVIDER_SITE_OTHER): Admitting: Podiatry

## 2023-10-28 ENCOUNTER — Ambulatory Visit (INDEPENDENT_AMBULATORY_CARE_PROVIDER_SITE_OTHER)

## 2023-10-28 ENCOUNTER — Encounter: Payer: Self-pay | Admitting: Podiatry

## 2023-10-28 VITALS — Ht 67.0 in | Wt 217.0 lb

## 2023-10-28 DIAGNOSIS — M2141 Flat foot [pes planus] (acquired), right foot: Secondary | ICD-10-CM

## 2023-10-28 DIAGNOSIS — L84 Corns and callosities: Secondary | ICD-10-CM | POA: Diagnosis not present

## 2023-10-28 DIAGNOSIS — L600 Ingrowing nail: Secondary | ICD-10-CM

## 2023-10-28 DIAGNOSIS — M2041 Other hammer toe(s) (acquired), right foot: Secondary | ICD-10-CM | POA: Diagnosis not present

## 2023-10-28 DIAGNOSIS — M2042 Other hammer toe(s) (acquired), left foot: Secondary | ICD-10-CM

## 2023-10-28 DIAGNOSIS — M778 Other enthesopathies, not elsewhere classified: Secondary | ICD-10-CM

## 2023-10-28 DIAGNOSIS — M2142 Flat foot [pes planus] (acquired), left foot: Secondary | ICD-10-CM

## 2023-10-28 NOTE — Patient Instructions (Signed)
 Look for urea 40% cream or ointment and apply to the thickened dry skin / calluses. This can be bought over the counter, at a pharmacy or online such as [employer].   More silicone pads can be purchased from:  [email or web address]   VISIT SUMMARY: Today, we discussed your right foot pain and tingling, which are concerning due to your history of prediabetes and recent weight loss surgery. We also addressed your ingrown toenail, callus formation, and upcoming back surgery.  YOUR PLAN: -HAMMERTOE: Hammertoe is a condition where a toe becomes bent at the middle joint, causing it to look like a hammer. For your semi-reducible hammertoe of the second toe, we will provide a silicone toe cap to reduce pressure on the nail. You should keep the nail filed and cut back to minimize pressure. If conservative measures fail, surgical intervention may be considered.  -INGROWN TOENAIL: An ingrown toenail occurs when the edge of the toenail grows into the skin, causing pain and sometimes infection. Your pain is due to pressure from the hammertoe. Monitor for signs of infection such as redness, swelling, or heat, and contact us  if these signs develop for potential nail removal.  -CALLUS FORMATION: Calluses are thickened areas of skin that form due to repeated pressure or friction. To manage your calluses, use urea 40% cream as an exfoliant and consider using corn remover pads with salicylic acid to help peel off dead skin. Continue using a pumice stone or shaver at home.  -PES PLANUS: Pes planus, or flat feet, is a condition where the arches of the feet flatten out. This was noted during your visit.  -BACK PAIN WITH SCHEDULED SURGERY: Your back pain and the associated numbness and tingling in your feet are likely due to lumbar spine compression affecting nerve roots. These symptoms should resolve after your scheduled back surgery in October.  INSTRUCTIONS: Please monitor your ingrown toenail for signs  of infection and contact us  if you notice redness, swelling, or heat. Continue using the recommended treatments for your calluses and hammertoe. Follow up with us  after your back surgery in October to discuss your recovery and any remaining symptoms.                      Contains text generated by [employer].                                 Contains text generated by [employer].

## 2023-10-28 NOTE — Progress Notes (Signed)
 Subjective:  Patient ID: Donna Burns, female    DOB: Sep 06, 1980,  MRN: [id number]  Chief Complaint  Patient presents with   Foot Pain    Patient is here for right foot pain    Discussed the use of AI scribe software for clinical note transcription with the patient, who gave verbal consent to proceed.  History of Present Illness Donna Burns is a 43 year old female with prediabetes who presents with right foot pain and tingling.  She experiences tingling sensations in her feet, particularly noticeable during activities such as pedicures. This symptom is concerning to her due to her history of prediabetes and recent weight loss surgery.  She also experiences pain associated with ingrown toenails on her right foot, specifically the second toenail. The pain is located at the tip of the toenail and is exacerbated when the nail grows out. She manages the pain by keeping the nails trimmed low.  Additionally, she has calluses on her right foot and has attempted to manage them with cream and Vaseline. She applies cream at night and has tried Vaseline to manage the calluses, which sometimes become large. She is looking for ways to prevent the calluses from growing.  She is scheduled for back surgery in October and reports that her back issues affect both legs. However, no numbness or tingling when specific areas are tapped.      Objective:    Physical Exam VASCULAR: DP and PT pulse palpable. Foot is warm and well-perfused. Capillary fill time is brisk. DERMATOLOGIC: Diffuse callus on the ball of the foot, medial hallux, and MTP joint on the right. No open lesions or rashes or ulcerations. NEUROLOGIC: Normal sensation to light touch and pressure. No paresthesias on examination. ORTHOPEDIC: Smooth pain-free range of motion of all examined joints. No ecchymosis or bruising. No gross deformity. No pain to palpation. Semi-reducible hammertoe of the second toe with elongated dystrophic nail,  longitudinal melanonychia, and thickened nail plate distally. Pes planus deformity. No paronychia.   No images are attached to the encounter.    Results RADIOLOGY Right foot radiographs: Elongated second toe with digital contracture. No fracture or stress fracture. (10/28/2023)   Assessment:   1. Hammertoe of right foot   2. Hammertoe of left foot   3. Pes planus of both feet   4. Callus of foot   5. Ingrown toenail      Plan:  Patient was evaluated and treated and all questions answered.  Assessment and Plan Assessment & Plan Hammertoe Semi-reducible hammertoe of the second toe with elongated second toe dystrophic nail causing pressure and pain. She prefers conservative measures over surgical intervention, which would involve tendon clipping or knuckle removal and a recovery period of approximately six weeks in a walking shoe. - Provide silicone toe cap to reduce pressure on the nail. - Advise keeping the nail filed and cut back to minimize pressure. - Discuss potential surgical intervention if conservative measures fail.  Ingrown toenail Pain at the tip of the second toenail due to pressure from hammertoe. No pain on the medial or lateral sides of the nail. Removal of the entire toenail is an option if infection occurs, but not currently necessary. - Advise monitoring for signs of infection such as redness, swelling, or heat. - Instruct to contact if infection signs develop for potential nail removal.  Callus formation Diffuse callus formation on the ball of the foot, medial hallux, and MTP joint on the right foot. She uses cream at night and has  been advised to use a pumice stone or shaver at home. Discussed the use of urea cream and corn remover pads with salicylic acid to reduce callus formation. - Recommend using urea 40% cream as an exfoliant to reduce calluses. - Suggest using corn remover pads with salicylic acid to help peel off dead skin.  Pes planus Pes planus  deformity present.  Back pain with scheduled surgery Scheduled for back surgery in October. Numbness and tingling in the feet likely related to lumbar spine compression affecting nerve roots. These symptoms should improve post-surgery.      No follow-ups on file.

## 2023-12-03 ENCOUNTER — Encounter: Payer: Self-pay | Admitting: Family Medicine

## 2023-12-09 DIAGNOSIS — D508 Other iron deficiency anemias: Secondary | ICD-10-CM | POA: Diagnosis not present

## 2023-12-09 DIAGNOSIS — Z903 Acquired absence of stomach [part of]: Secondary | ICD-10-CM | POA: Diagnosis not present

## 2023-12-09 DIAGNOSIS — N1831 Chronic kidney disease, stage 3a: Secondary | ICD-10-CM | POA: Diagnosis not present

## 2023-12-12 ENCOUNTER — Telehealth: Payer: Self-pay | Admitting: Family Medicine

## 2023-12-12 NOTE — Telephone Encounter (Signed)
 Patient was identified as falling into the [place] Measure - Diabetes.   Patient was: Appointment already scheduled for:  01/14/24.

## 2024-01-05 ENCOUNTER — Ambulatory Visit: Admitting: Orthopedic Surgery

## 2024-01-07 ENCOUNTER — Other Ambulatory Visit (INDEPENDENT_AMBULATORY_CARE_PROVIDER_SITE_OTHER): Payer: Self-pay

## 2024-01-07 ENCOUNTER — Ambulatory Visit: Admitting: Orthopedic Surgery

## 2024-01-07 DIAGNOSIS — M5416 Radiculopathy, lumbar region: Secondary | ICD-10-CM

## 2024-01-07 NOTE — Progress Notes (Signed)
 Orthopedic Spine Surgery Office Note   Assessment: Patient is a 43 y.o. female with low back pain that is into the bilateral buttocks and along the posterior aspect of the left thigh and leg.  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  She also has PI-LL mismatch     Plan: -Patient has tried PT, tylenol , intramuscular steroid injection, Medrol  Dosepak, lumbar steroid injection -Discussed options going forward.  She has tried multiple nonoperative treatments so surgery would be an option for her since she has not had relief with these conservative treatments -She is not in a place to consider surgery at this time so we will hold off -When she comes back in January, we will reinitiate treatment and likely get a new MRI. Will not get x-rays at our next visit but would tentatively plan to get them at the following visit -Patient should return to office in 5 months, x-rays at next visit: none     Patient expressed understanding of the plan and all questions were answered to the patient's satisfaction.    ___________________________________________________________________________     History:   Patient is a 43 y.o. female who presents today for follow-up on her lumbar spine.  Patient continues to have similar symptoms.  She is having back pain radiates into her bilateral lower extremities.  She feels going into the right buttock and on the left side she is going down the posterior aspect of the thigh and leg to the level of the ankle.  She has not noticed any significant changes since she was last seen in the office.  She is currently now working and is not quite in the place where surgery is an option.  She has been to continue to manage with over-the-counter medications.     Treatments tried: PT, tylenol , intramuscular steroid injection, Medrol  Dosepak, lumbar steroid injection     Physical Exam:   General: no acute distress, appears stated  age Neurologic: alert, answering questions appropriately, following commands Respiratory: unlabored breathing on room air, symmetric chest rise Psychiatric: appropriate affect, normal cadence to speech     MSK (spine):   -Strength exam                                                   Left                  Right EHL                              5/5                  5/5 TA                                 5/5                  5/5 GSC                             5/5                  5/5 Knee extension  5/5                  5/5 Hip flexion                    5/5                  5/5   -Sensory exam                           Sensation intact to light touch in L3-S1 nerve distributions of bilateral lower extremities   Imaging: XRs of the lumbar spine from 01/07/2024 were independently reviewed and interpreted, showing disc height loss with spondylolisthesis at L4/5.  No other significant degenerative changes seen.  No fracture or dislocation seen.   MRI of the lumbar spine from 06/07/2023 was previously independently reviewed and interpreted, showing central and lateral stenosis at L4/5. Spondylolisthesis that measures 3.45mm in the supine position. DDD at L4/5.      Patient name: Donna Burns Patient MRN: [id number] Date of visit: 01/07/24

## 2024-01-11 ENCOUNTER — Other Ambulatory Visit: Payer: Self-pay | Admitting: Medical Genetics

## 2024-01-13 ENCOUNTER — Ambulatory Visit: Admitting: Family Medicine

## 2024-01-14 ENCOUNTER — Ambulatory Visit: Admitting: Family Medicine

## 2024-02-20 ENCOUNTER — Encounter: Payer: Self-pay | Admitting: Orthopedic Surgery

## 2024-03-04 ENCOUNTER — Ambulatory Visit: Admitting: Family Medicine

## 2024-03-08 DIAGNOSIS — D508 Other iron deficiency anemias: Secondary | ICD-10-CM | POA: Diagnosis not present

## 2024-03-08 DIAGNOSIS — Z903 Acquired absence of stomach [part of]: Secondary | ICD-10-CM | POA: Diagnosis not present

## 2024-03-08 DIAGNOSIS — N1831 Chronic kidney disease, stage 3a: Secondary | ICD-10-CM | POA: Diagnosis not present

## 2024-03-08 DIAGNOSIS — D649 Anemia, unspecified: Secondary | ICD-10-CM | POA: Diagnosis not present

## 2024-03-08 DIAGNOSIS — E559 Vitamin D deficiency, unspecified: Secondary | ICD-10-CM | POA: Diagnosis not present

## 2024-03-15 DIAGNOSIS — Z903 Acquired absence of stomach [part of]: Secondary | ICD-10-CM | POA: Diagnosis not present

## 2024-03-15 DIAGNOSIS — N1831 Chronic kidney disease, stage 3a: Secondary | ICD-10-CM | POA: Diagnosis not present

## 2024-03-15 DIAGNOSIS — Z9884 Bariatric surgery status: Secondary | ICD-10-CM | POA: Diagnosis not present

## 2024-03-15 DIAGNOSIS — D649 Anemia, unspecified: Secondary | ICD-10-CM | POA: Diagnosis not present

## 2024-03-19 ENCOUNTER — Other Ambulatory Visit (HOSPITAL_COMMUNITY)

## 2024-03-22 DIAGNOSIS — D649 Anemia, unspecified: Secondary | ICD-10-CM | POA: Diagnosis not present

## 2024-03-29 ENCOUNTER — Encounter: Payer: Self-pay | Admitting: Radiology

## 2024-04-05 ENCOUNTER — Other Ambulatory Visit: Payer: Self-pay | Admitting: Medical Genetics

## 2024-04-05 DIAGNOSIS — Z006 Encounter for examination for normal comparison and control in clinical research program: Secondary | ICD-10-CM

## 2024-04-10 DIAGNOSIS — R55 Syncope and collapse: Secondary | ICD-10-CM | POA: Diagnosis not present

## 2024-04-12 ENCOUNTER — Telehealth: Payer: Self-pay | Admitting: Orthopedic Surgery

## 2024-04-12 NOTE — Telephone Encounter (Signed)
 Pt called asking for a renewal of handicap placard. Please call when ready for pick up

## 2024-04-12 NOTE — Telephone Encounter (Signed)
 Pt is aware for is ready

## 2024-05-03 DIAGNOSIS — R519 Headache, unspecified: Secondary | ICD-10-CM | POA: Diagnosis not present

## 2024-05-03 DIAGNOSIS — I10 Essential (primary) hypertension: Secondary | ICD-10-CM | POA: Diagnosis not present

## 2024-05-03 DIAGNOSIS — R051 Acute cough: Secondary | ICD-10-CM | POA: Diagnosis not present

## 2024-05-03 DIAGNOSIS — R0981 Nasal congestion: Secondary | ICD-10-CM | POA: Diagnosis not present

## 2024-05-14 ENCOUNTER — Ambulatory Visit: Admitting: Family Medicine

## 2024-06-29 ENCOUNTER — Encounter: Payer: Self-pay | Admitting: Family Medicine
